# Patient Record
Sex: Female | Born: 1940 | Race: White | Hispanic: No | Smoking: Never smoker
Health system: Southern US, Community
[De-identification: ages and names within clinical notes are randomized; demographics above are authoritative.]

## PROBLEM LIST (undated history)

## (undated) DIAGNOSIS — M1990 Unspecified osteoarthritis, unspecified site: Secondary | ICD-10-CM

## (undated) DIAGNOSIS — K219 Gastro-esophageal reflux disease without esophagitis: Secondary | ICD-10-CM

## (undated) DIAGNOSIS — F419 Anxiety disorder, unspecified: Secondary | ICD-10-CM

## (undated) DIAGNOSIS — E785 Hyperlipidemia, unspecified: Secondary | ICD-10-CM

## (undated) DIAGNOSIS — T8859XA Other complications of anesthesia, initial encounter: Secondary | ICD-10-CM

## (undated) DIAGNOSIS — R002 Palpitations: Secondary | ICD-10-CM

## (undated) DIAGNOSIS — I10 Essential (primary) hypertension: Secondary | ICD-10-CM

## (undated) DIAGNOSIS — G250 Essential tremor: Secondary | ICD-10-CM

## (undated) DIAGNOSIS — E119 Type 2 diabetes mellitus without complications: Secondary | ICD-10-CM

## (undated) DIAGNOSIS — F32A Depression, unspecified: Secondary | ICD-10-CM

## (undated) DIAGNOSIS — M797 Fibromyalgia: Secondary | ICD-10-CM

## (undated) DIAGNOSIS — F329 Major depressive disorder, single episode, unspecified: Secondary | ICD-10-CM

## (undated) HISTORY — DX: Palpitations: R00.2

## (undated) HISTORY — DX: Depression, unspecified: F32.A

## (undated) HISTORY — DX: Major depressive disorder, single episode, unspecified: F32.9

## (undated) HISTORY — DX: Anxiety disorder, unspecified: F41.9

## (undated) HISTORY — DX: Essential tremor: G25.0

## (undated) HISTORY — DX: Hyperlipidemia, unspecified: E78.5

## (undated) HISTORY — PX: EYE SURGERY: SHX253

---

## 2000-10-21 ENCOUNTER — Inpatient Hospital Stay (HOSPITAL_COMMUNITY): Admission: EM | Admit: 2000-10-21 | Discharge: 2000-10-23 | Payer: Self-pay | Admitting: Emergency Medicine

## 2000-10-21 ENCOUNTER — Encounter: Payer: Self-pay | Admitting: Emergency Medicine

## 2000-10-23 ENCOUNTER — Encounter: Payer: Self-pay | Admitting: Family Medicine

## 2000-10-26 ENCOUNTER — Encounter: Payer: Self-pay | Admitting: Family Medicine

## 2000-10-26 ENCOUNTER — Ambulatory Visit (HOSPITAL_COMMUNITY): Admission: RE | Admit: 2000-10-26 | Discharge: 2000-10-26 | Payer: Self-pay | Admitting: Family Medicine

## 2003-06-14 ENCOUNTER — Ambulatory Visit (HOSPITAL_COMMUNITY): Admission: RE | Admit: 2003-06-14 | Discharge: 2003-06-14 | Payer: Self-pay | Admitting: Family Medicine

## 2003-06-26 ENCOUNTER — Ambulatory Visit (HOSPITAL_COMMUNITY): Admission: RE | Admit: 2003-06-26 | Discharge: 2003-06-26 | Payer: Self-pay | Admitting: Family Medicine

## 2003-11-30 ENCOUNTER — Emergency Department (HOSPITAL_COMMUNITY): Admission: EM | Admit: 2003-11-30 | Discharge: 2003-11-30 | Payer: Self-pay | Admitting: *Deleted

## 2007-01-30 ENCOUNTER — Ambulatory Visit (HOSPITAL_COMMUNITY): Admission: RE | Admit: 2007-01-30 | Discharge: 2007-01-30 | Payer: Self-pay | Admitting: Family Medicine

## 2007-10-04 ENCOUNTER — Ambulatory Visit (HOSPITAL_COMMUNITY): Admission: RE | Admit: 2007-10-04 | Discharge: 2007-10-04 | Payer: Self-pay | Admitting: Family Medicine

## 2008-07-12 ENCOUNTER — Emergency Department (HOSPITAL_COMMUNITY): Admission: EM | Admit: 2008-07-12 | Discharge: 2008-07-12 | Payer: Self-pay | Admitting: Emergency Medicine

## 2009-04-27 ENCOUNTER — Encounter: Payer: Self-pay | Admitting: Cardiology

## 2009-04-28 ENCOUNTER — Encounter (INDEPENDENT_AMBULATORY_CARE_PROVIDER_SITE_OTHER): Payer: Self-pay | Admitting: *Deleted

## 2009-04-28 LAB — CONVERTED CEMR LAB
CO2: 22 meq/L
Calcium: 9.4 mg/dL
Glucose, Bld: 123 mg/dL
Hemoglobin: 14 g/dL
MCV: 88.4 fL
Platelets: 381 10*3/uL
Potassium: 3.7 meq/L
Sodium: 137 meq/L
Sodium: 137 meq/L

## 2009-04-29 ENCOUNTER — Encounter: Payer: Self-pay | Admitting: Cardiology

## 2009-04-30 ENCOUNTER — Encounter: Payer: Self-pay | Admitting: Cardiology

## 2009-04-30 ENCOUNTER — Ambulatory Visit (HOSPITAL_COMMUNITY): Admission: RE | Admit: 2009-04-30 | Discharge: 2009-04-30 | Payer: Self-pay | Admitting: Family Medicine

## 2009-05-07 ENCOUNTER — Encounter (INDEPENDENT_AMBULATORY_CARE_PROVIDER_SITE_OTHER): Payer: Self-pay | Admitting: *Deleted

## 2009-05-07 DIAGNOSIS — F410 Panic disorder [episodic paroxysmal anxiety] without agoraphobia: Secondary | ICD-10-CM

## 2009-05-07 DIAGNOSIS — F341 Dysthymic disorder: Secondary | ICD-10-CM | POA: Insufficient documentation

## 2009-05-07 DIAGNOSIS — I10 Essential (primary) hypertension: Secondary | ICD-10-CM

## 2009-05-07 DIAGNOSIS — F329 Major depressive disorder, single episode, unspecified: Secondary | ICD-10-CM

## 2009-05-07 DIAGNOSIS — E785 Hyperlipidemia, unspecified: Secondary | ICD-10-CM | POA: Insufficient documentation

## 2009-05-19 ENCOUNTER — Ambulatory Visit: Payer: Self-pay | Admitting: Cardiology

## 2009-05-19 DIAGNOSIS — K219 Gastro-esophageal reflux disease without esophagitis: Secondary | ICD-10-CM | POA: Insufficient documentation

## 2009-05-19 DIAGNOSIS — R55 Syncope and collapse: Secondary | ICD-10-CM | POA: Insufficient documentation

## 2009-05-19 DIAGNOSIS — R739 Hyperglycemia, unspecified: Secondary | ICD-10-CM | POA: Insufficient documentation

## 2009-05-19 DIAGNOSIS — IMO0001 Reserved for inherently not codable concepts without codable children: Secondary | ICD-10-CM

## 2009-05-20 ENCOUNTER — Encounter: Payer: Self-pay | Admitting: Cardiology

## 2009-05-25 ENCOUNTER — Encounter (INDEPENDENT_AMBULATORY_CARE_PROVIDER_SITE_OTHER): Payer: Self-pay | Admitting: *Deleted

## 2009-08-20 ENCOUNTER — Encounter (INDEPENDENT_AMBULATORY_CARE_PROVIDER_SITE_OTHER): Payer: Self-pay | Admitting: *Deleted

## 2009-08-21 ENCOUNTER — Ambulatory Visit: Payer: Self-pay | Admitting: Cardiology

## 2009-08-24 LAB — CONVERTED CEMR LAB: Cortisol - AM: 17.3 ug/dL (ref 4.3–22.4)

## 2009-09-02 ENCOUNTER — Encounter (INDEPENDENT_AMBULATORY_CARE_PROVIDER_SITE_OTHER): Payer: Self-pay | Admitting: *Deleted

## 2009-10-28 ENCOUNTER — Encounter: Payer: Self-pay | Admitting: Cardiology

## 2010-03-12 ENCOUNTER — Encounter (INDEPENDENT_AMBULATORY_CARE_PROVIDER_SITE_OTHER): Payer: Self-pay | Admitting: *Deleted

## 2010-03-30 ENCOUNTER — Ambulatory Visit: Payer: Self-pay | Admitting: Cardiology

## 2010-04-25 ENCOUNTER — Encounter: Payer: Self-pay | Admitting: Family Medicine

## 2010-05-04 NOTE — Assessment & Plan Note (Signed)
Summary: ***NP3 SYNCOPE   Visit Type:  Initial Consult Primary Provider:  Dr. Lilyan Punt   History of Present Illness: [name] is a very pleasant 70 year old woman seen at the kind request of Dr. Lilyan Punt for evaluation of long-standing episodic syncope.  Patient dates the onset of her symptoms to her teenage years.  Frequently when she arises, experiences strong emotion, experiences pain or experiences GI distress, she subsequently loses consciousness briefly.  Episodes have been too numerous to count, but injury has been rare.  She once fell in the bathroom and suffered a laceration that required sutures.  She describes 2 recent episodes after arising in the morning.  She noted dizziness and then found herself on the floor.   Significant premonitory symptoms accompany most episodes.  Current Medications (verified): 1)  Lorazepam 1 Mg Tabs (Lorazepam) .... 1/2 Tab Three Times A Day Then 1 Whole Tab At Bedtime 2)  Hydrochlorothiazide 25 Mg Tabs (Hydrochlorothiazide) .... Take 1 Tab Daily 3)  Protonix 40 Mg Solr (Pantoprazole Sodium) .... Take 1 Tab Two Times A Day 4)  Paxil 30 Mg Tabs (Paroxetine Hcl) .... Take 1 Tab Daily 5)  Lipitor 80 Mg Tabs (Atorvastatin Calcium) .... Take 1 Tab Daily 6)  Xalatan 0.005 % Soln (Latanoprost) .[name].. 1 Drop Each Eye At Bedtime  Allergies (verified): 1)  ! Codeine  Past History:  Family History: Last updated: 05-24-09 Father deceased secondary to myocardial infarction Mother-alive, but has history of both adenocarcinoma of the colon and ovarian carcinoma Siblings-2; one with history of deep vein thrombosis.  Positive for rheumatoid arthritis and thyroid disease  Social History: Last updated: 05/24/2009 Employment-has not worked outside of the home Tobacco Use -Never Alcohol Use - no Regular Exercise - no Drug Use - no  PMH, FH, and Social History reviewed and updated.  Past Medical History: Syncope HYPERTENSION  (ICD-401.9) HYPERLIPIDEMIA (ICD-272.4) DEPRESSION (ICD-311) ANXIETY (ICD-300.00)  Past Surgical History: Bilateral excisional breast biopsies for benign disease-1960s  Family History: Father deceased secondary to myocardial infarction Mother-alive, but has history of both adenocarcinoma of the colon and ovarian carcinoma Siblings-2; one with history of deep vein thrombosis.  Positive for rheumatoid arthritis and thyroid disease  Social History: Employment-has not worked outside of the home Tobacco Use -Never Alcohol Use - no Regular Exercise - no Drug Use - no  Review of Systems       Occasional episodes of dizziness; requires corrective lenses for near vision; few mild palpitations; poor appetite without significant weight loss; gastroesophageal reflux disease symptoms; chronic back pain.  All other systems reviewed and are negative.  Vital Signs:  Patient profile:   70 year old female Height:      60 inches Weight:      163 pounds BMI:     31.95 Pulse rate:   103 / minute Pulse (ortho):   109 / minute BP sitting:   137 / 93  (right arm) BP standing:   111 / 80  Vitals Entered By: Dreama Saa, CNA (May 24, 2009 1:05 PM)  Serial Vital Signs/Assessments:  Time      Position  BP       Pulse  Resp  Temp     By 1:53 PM   Lying LA  126/90   102                   Lynn Via LPN 8:41 PM   Sitting   130/84   114  Larita Fife Via LPN 1:61 PM   Standing  111/80   109                   Lynn Via LPN  Comments: 0:96 PM Patient c/o slight dizziness when standing from sitting position. By: Larita Fife Via LPN    Physical Exam  General:   Well-developed; no acute distress; overweight HEENT-[place]/AT; PERRL; EOM intact; conjunctiva and lids nl:  Neck-No JVD; no carotid bruits: Endocrine-No thyromegaly: Lungs-No tachypnea, clear without rales, rhonchi or wheezes: CV-normal PMI; normal S1 and S2 Abdomen-BS normal; soft and non-tender without masses or  organomegaly: MS-No deformities, cyanosis or clubbing: Neurologic-Nl cranial nerves; symmetric strength and tone: Skin- Warm, multiple skin tags Extremities-Nl distal pulses; no edema    Impression & Recommendations:  Problem # 1:  SYNCOPE (ICD-780.2) The long-standing and recurring nature of the patient's symptoms essentially exclude conditions with a significant risk of mortality.  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 There is a borderline significant decrease in systolic blood pressure of 15-20 mmHg when the upright posture is assumed.  There is also a resting tachycardia exacerbated by standing.  She made need criteria for Postural Orthostatic Tachycardia Syndrome.   Initial therapy will involve liberalizing the salt in her diet, maintaining fluid intake, avoiding dehydration and behavioral measures, such as isometric exercise, to prevent loss of consciousness when threatened.  A serum cortisol level will be obtained.  Recent thyroid-stimulating hormone level was normal.  Problem # 2:  HYPERLIPIDEMIA (ICD-272.4)  No recent lipid profile results are available to me.  I would be happy to assist with management of this problem if Dr. Gerda Diss so desires.  Otherwise, [name]  Other Orders: Future Orders: T- * Misc. Laboratory test [phone]) ... 05/20/2009   Patient Instructions: 1)  Your physician recommends that you schedule a follow-up appointment in: 3 months 2)  Your physician recommends that you return for lab work in: tomorrow 3)  Your physician has requested that you limit the intake of sodium (salt) in your diet to four grams daily. Please see [facility] handout.

## 2010-05-04 NOTE — Letter (Signed)
Summary: Appointment - Reminder 2  [facility] [address]   [place], [place] [address]   Phone: [phone]  Fax: [phone]     March 12, 2010 MRN: [id number]   Jacqueline Morgan [address] [place], [place]  [address]   Dear Ms. SOTTILE,  Our records indicate that it is time to schedule a follow-up appointment.  Dr.   Dietrich Pates       recommended that you follow up with [place] in   12.2011         . It is very important that we reach you to schedule this appointment. We look forward to participating in your health care needs. Please contact us at the number listed above at your earliest convenience to schedule your appointment.  If you are unable to make an appointment at this time, give [place] a call so we can update our records.     Sincerely,   Glass blower/designer

## 2010-05-04 NOTE — Assessment & Plan Note (Signed)
Summary: F3M   Visit Type:  Follow-up Primary Provider:  Dr. Lilyan Punt  CC:  .[name]  History of Present Illness: Jacqueline Morgan returns to the office as scheduled for continued assessment and treatment of long-standing and recurrent syncope.  Over the past few months, symptoms have been improved.  She notes occasional lightheadedness that resolves with assuming a seated position.  She has had 2 episodes of loss of consciousness, both occurring in proximity to a GI illness characterized by abdominal pain and profuse diarrhea.  On one occasion, she lay on the floor when she felt impending loss of consciousness, but this did not prevent a spell.  On neither occasion did she fall or suffer any injuries.  Current Medications (verified): 1)  Lorazepam 1 Mg Tabs (Lorazepam) .... 1/2 Tab Three Times A Day Then 1 Whole Tab At Bedtime 2)  Protonix 40 Mg Solr (Pantoprazole Sodium) .... Take 1 Tab Daily 3)  Paxil 30 Mg Tabs (Paroxetine Hcl) .... Take 1 Tab Daily 4)  Lipitor 40 Mg Tabs (Atorvastatin Calcium) .... Take 1 Tab Daily 5)  Xalatan 0.005 % Soln (Latanoprost) .[name].. 1 Drop Each Eye At Bedtime 6)  Acidophilus  Caps (Lactobacillus) .... Take 1 Tab Daily 7)  Metoprolol Succinate 50 Mg Xr24h-Tab (Metoprolol Succinate) .... Take 1 Tablet By Mouth Once A Day  Allergies (verified): 1)  ! Codeine  Past History:  PMH, FH, and Social History reviewed and updated.  Review of Systems  The patient denies weight loss, weight gain, chest pain, dyspnea on exertion, and peripheral edema.    Vital Signs:  Patient profile:   70 year old female Weight:      166 pounds Pulse rate:   99 / minute Pulse (ortho):   102 / minute BP sitting:   140 / 87  (right arm) BP standing:   115 / 75  Vitals Entered By: Dreama Saa, CNA (Aug 21, 2009 12:55 PM)  Serial Vital Signs/Assessments:  Time      Position  BP       Pulse  Resp  Temp     By 1:00pm    Lying LA  114/80   102                   Tammy Sanders  RN 1:00pm    Sitting   121/81   98                    Tammy Sanders RN 1:00pm    Standing  115/75   102                   Tammy Sanders RN   Physical Exam  General:   Well-developed; no acute distress; overweight; facial tics and tremor Blood pressure 100/60 without orthostatic change HEENT-[place]/AT; PERRL; EOM intact; conjunctiva and lids nl:  Neck-No JVD; no carotid bruits: Endocrine-No thyromegaly: Lungs-No tachypnea, clear without rales, rhonchi or wheezes: CV-normal PMI; normal S1 and S2 Abdomen-BS normal; soft and non-tender without masses or organomegaly: MS-No deformities, cyanosis or clubbing: Neurologic-Nl cranial nerves; symmetric strength and tone: Skin- Warm, multiple skin tags Extremities-Nl distal pulses; no edema    Impression & Recommendations:  Problem # 1:  SYNCOPE (ICD-780.2) Symptoms are improved with an increase in dietary sodium.  Absolutely no change is seen between lying and standing systolic blood pressure at today's visit, which is an improvement.  The diuretic that she is taking for hypertension could be an additional predisposition  to decreased blood pressure and loss of consciousness.  We will substitute metoprolol succinate 50 mg q.d., which may also have a beneficial effect in POTS Syndrome.  Patient will monitor blood pressure and call for any significant elevations.  She will also call for any unprovoked episodes of syncope.  Otherwise, will plan to see this nice woman again in 7 months.  We cannot locate a cortisol level, which she believes was drawn a few months ago.  That test will be repeated.  Other Orders: Future Orders: T- * Misc. Laboratory test [phone]) ... 08/24/2009  Patient Instructions: 1)  Your physician recommends that you schedule a follow-up appointment in: 7 months 2)  Your physician recommends that you return for lab work in: Monday 3)  Your physician has recommended you make the following change in your medication: stop  hydrocholathazide, start metoprolol succinate 50mg  daily 4)  Your physician has requested that you regularly monitor and record your blood pressure readings at home.  Please use the same machine at the same time of day to check your readings and record them to bring to your follow-up visit. 5)  call for systolic bp >140 and Diastolic bp >90 Prescriptions: METOPROLOL SUCCINATE 50 MG XR24H-TAB (METOPROLOL SUCCINATE) Take 1 tablet by mouth once a day  #30 x 3   Entered by:   Teressa Lower RN   Authorized by:   Kathlen Brunswick, MD, [facility]   Signed by:   Teressa Lower RN on 08/21/2009   Method used:   Electronically to        [facility] (retail)       [address]       [place], [place]  [address]       Ph: [phone]       Fax: [phone]   RxID:   [id number]

## 2010-05-04 NOTE — Miscellaneous (Signed)
Summary: LABS CBCD,BMP,T4,TSH,04/28/2009  Clinical Lists Changes  Observations: Added new observation of CALCIUM: 9.4 mg/dL (16/01/9603 5:40) Added new observation of CREATININE: 0.79 mg/dL (98/02/9146 8:29) Added new observation of BUN: 11 mg/dL (56/21/3086 5:78) Added new observation of BG RANDOM: 123 mg/dL (46/96/2952 8:41) Added new observation of CO2 PLSM/SER: 22 meq/L (04/28/2009 8:24) Added new observation of CL SERUM: 101 meq/L (04/28/2009 8:24) Added new observation of K SERUM: 3.7 meq/L (04/28/2009 8:24) Added new observation of NA: 137 meq/L (04/28/2009 8:24) Added new observation of PLATELETK/UL: 381 K/uL (04/28/2009 8:24) Added new observation of MCV: 88.4 fL (04/28/2009 8:24) Added new observation of HCT: 42.6 % (04/28/2009 8:24) Added new observation of HGB: 14.0 g/dL (32/44/0102 7:25) Added new observation of WBC COUNT: 6.2 10*3/microliter (04/28/2009 8:24) Added new observation of TSH: 1.163 microintl units/mL (04/28/2009 8:24) Added new observation of T4, FREE: 8.9 ng/dL (36/64/4034 7:42)

## 2010-05-04 NOTE — Letter (Signed)
Summary: EXG  EXG   Imported By: Faythe Ghee 05/20/2009 15:25:43  _____________________________________________________________________  External Attachment:    Type:   Image     Comment:   External Document

## 2010-05-04 NOTE — Letter (Signed)
Summary: bp readings  bp readings   Imported By: Faythe Ghee 10/28/2009 09:27:53  _____________________________________________________________________  External Attachment:    Type:   Image     Comment:   External Document

## 2010-05-04 NOTE — Miscellaneous (Signed)
Summary: LABS BMP,T4,TSH,04/28/2009  Clinical Lists Changes  Observations: Added new observation of CALCIUM: 9.4 mg/dL (16/01/9603 54:09) Added new observation of CREATININE: 0.79 mg/dL (81/19/1478 29:56) Added new observation of BUN: 11 mg/dL (21/30/8657 84:69) Added new observation of BG RANDOM: 123 mg/dL (62/95/2841 32:44) Added new observation of CO2 PLSM/SER: 22 meq/L (04/28/2009 10:15) Added new observation of CL SERUM: 101 meq/L (04/28/2009 10:15) Added new observation of K SERUM: 3.7 meq/L (04/28/2009 10:15) Added new observation of NA: 137 meq/L (04/28/2009 10:15) Added new observation of TSH: 1.163 microintl units/mL (04/28/2009 10:15) Added new observation of T4, FREE: 8.9 ng/dL (04/06/7251 66:44)

## 2010-05-04 NOTE — Letter (Signed)
Summary: LABS  LABS   Imported By: Faythe Ghee 05/20/2009 15:09:15  _____________________________________________________________________  External Attachment:    Type:   Image     Comment:   External Document

## 2010-05-04 NOTE — Letter (Signed)
Summary: MED LIST  MED LIST   Imported By: Faythe Ghee 05/20/2009 15:23:43  _____________________________________________________________________  External Attachment:    Type:   Image     Comment:   External Document

## 2010-05-04 NOTE — Letter (Signed)
Summary: [place] [facility] at [facility]  [address] [place] [address]   Phone: [phone]  Fax: [phone]      September 02, 2009 MRN: [id number]   Jacqueline Morgan [address] [place], [place]  [address]   Dear [name],  Your test ordered by Selena Batten has been reviewed by your physician (or physician assistant) and was found to be normal or stable. Your physician (or physician assistant) felt no changes were needed at this time.  ____ Echocardiogram  ____ Cardiac Stress Test  __x__ Lab Work  ____ Peripheral vascular study of arms, legs or neck  ____ CT scan or X-ray  ____ Lung or Breathing test  ____ Other:  No change in medical treatment at this time, per Dr. Dietrich Pates.  Enclosed is a copy of your labwork for your records.  Thank you, Kristjan Derner Allyne Gee RN    Savannah Bing, MD, Lenise Arena.C.Gaylord Shih, MD, F.A.C.C Lewayne Bunting, MD, F.A.C.C Nona Dell, MD, F.A.C.C Charlton Haws, MD, Lenise Arena.C.C

## 2010-05-04 NOTE — Letter (Signed)
Summary: [place] [employer] [facility], [facility] at [facility]  [address] [place] [address]   Phone: [phone]  Fax: [phone]      May 25, 2009 MRN: [id number]   NARALY FRITCHER [address] [place], [place]  [address]   Dear [name],  Your test ordered by Selena Batten has been reviewed by your physician (or physician assistant) and was found to be normal or stable. Your physician (or physician assistant) felt no changes were needed at this time.  ____ Echocardiogram  ____ Cardiac Stress Test  __x__ Lab Work  ____ Peripheral vascular study of arms, legs or neck  ____ CT scan or X-ray  ____ Lung or Breathing test  ____ Other:  No change in medical treatment at this time, per Dr. Dietrich Pates.  Enclosed is a copy of your labwork for your records.  Thank you, Linnie Mcglocklin Allyne Gee RN    Kingsland Bing, MD, Lenise Arena.C.Gaylord Shih, MD, F.A.C.C Lewayne Bunting, MD, F.A.C.C Nona Dell, MD, F.A.C.C Charlton Haws, MD, Lenise Arena.C.C

## 2010-05-04 NOTE — Letter (Signed)
Summary: PROGRESS NOTES  PROGRESS NOTES   Imported By: Faythe Ghee 05/20/2009 15:25:02  _____________________________________________________________________  External Attachment:    Type:   Image     Comment:   External Document

## 2010-05-06 NOTE — Assessment & Plan Note (Signed)
Summary: rov   Visit Type:  Follow-up Primary Provider:  Dr. Lilyan Punt  CC:  no cardiology complaints.  History of Present Illness: Jacqueline Morgan is a pleasant 70 y/o CF we are following for continued assessment and treatment of syncope and palpatations.  She has had one episode of syncope approx 3 weeks ago associated with diarrhea and nausea. She states this is common for her to have with any GI illness. Review of records confirms this per prior visits.  She is followed by Dr. Gerda Diss and he checks labs concerning her cholesterol status. She is due for labs this week.  She has tremors that have been chronic for her. She says that she has chronic anxiety and painic attacks.  No complaints of chest pain or SOB.  Labs dated 11/24/2009: TC 253;TG 257; HDL 39; LDL 163.  She is on Liptor 40mg  daily. ++` Current Medications (verified): 1)  Lorazepam 1 Mg Tabs (Lorazepam) .... 1/2 Tab Three Times A Day Then 1 Whole Tab At Bedtime 2)  Protonix 40 Mg Solr (Pantoprazole Sodium) .... Take 1 Tab Daily 3)  Paxil 30 Mg Tabs (Paroxetine Hcl) .... Take 1 Tab Daily 4)  Lipitor 80 Mg Tabs (Atorvastatin Calcium) .... Take 1 Tab Daily 5)  Xalatan 0.005 % Soln (Latanoprost) .[name].. 1 Drop Each Eye At Bedtime 6)  Metoprolol Succinate 50 Mg Xr24h-Tab (Metoprolol Succinate) .... Take 1 Tablet By Mouth Once A Day  Allergies (verified): 1)  ! Codeine  Comments:  Nurse/Medical Assistant: patient brought meds reviewed with patient her pharmacy is [facility]  Review of Systems       All other systems have been reviewed and are negative unless stated above.   Vital Signs:  Patient profile:   70 year old female Weight:      170 pounds BMI:     33.32 O2 Sat:      98 % on Room air Pulse rate:   75 / minute BP sitting:   134 / 83  (right arm)  Vitals Entered By: Dreama Saa, CNA (March 30, 2010 1:35 PM)  O2 Flow:  Room air  Physical Exam  General:  Well developed, well nourished, in no acute  distress. Lungs:  Clear bilaterally to auscultation and percussion. Heart:  Non-displaced PMI, chest non-tender; regular rate and rhythm, S1, S2 without murmurs, rubs or gallops. Carotid upstroke normal, no bruit. Normal abdominal aortic size, no bruits. Femorals normal pulses, no bruits. Pedals normal pulses. No edema, no varicosities. Abdomen:  Bowel sounds positive; abdomen soft and non-tender without masses, organomegaly, or hernias noted. No hepatosplenomegaly. Msk:  Back normal, normal gait. Muscle strength and tone normal. Pulses:  pulses normal in all 4 extremities Extremities:  No clubbing or cyanosis. Neurologic:  Constant tremors   Impression & Recommendations:  Problem # 1:  HYPERLIPIDEMIA (ICD-272.4) She is due for labs this week. Will need careful monitoring of this and make medicine adjustments as necessary. May need to change to Crestor if she is not responding well to current Lipitor dose. Her updated medication list for this problem includes:    Lipitor 80 Mg Tabs (Atorvastatin calcium) .[name]... Take 1 tab daily  Problem # 2:  SYNCOPE (ICD-780.2) Syncope occurs with diarreha and nausea.  She states that this has happened for years.  She is not having this often. Use of metoprolol has helped her significantly.  Continue this as directed. She will see Dr. Dietrich Pates in 6 months unless she becomes symptomatic. Her updated medication list  for this problem includes:    Metoprolol Succinate 50 Mg Xr24h-tab (Metoprolol succinate) .[name]... Take 1 tablet by mouth once a day  Problem # 3:  ANXIETY (ICD-300.00) Tremors are baseline for her.  I have asked if she has been seen by a neurologist. She says it is related to her anxiety, but has never been seen by specialist.  Would recommend this at  the discretion of Dr. Gerda Diss.  Patient Instructions: 1)  Your physician recommends that you schedule a follow-up appointment in: 1 YEAR  Appended Document: rov  Reviewed Juanito Doom, MD

## 2010-07-10 ENCOUNTER — Emergency Department (HOSPITAL_COMMUNITY)
Admission: EM | Admit: 2010-07-10 | Discharge: 2010-07-10 | Disposition: A | Discharge status: Home / Self Care | Payer: Medicare Other | Attending: Emergency Medicine | Admitting: Emergency Medicine

## 2010-07-10 DIAGNOSIS — N390 Urinary tract infection, site not specified: Secondary | ICD-10-CM | POA: Insufficient documentation

## 2010-07-10 LAB — URINALYSIS, ROUTINE W REFLEX MICROSCOPIC
Bilirubin Urine: NEGATIVE
Glucose, UA: 100 mg/dL — AB
Ketones, ur: 15 mg/dL — AB
Nitrite: POSITIVE — AB
Specific Gravity, Urine: 1.025 (ref 1.005–1.030)
pH: 6.5 (ref 5.0–8.0)

## 2010-07-10 LAB — URINE MICROSCOPIC-ADD ON

## 2010-07-14 LAB — URINALYSIS, ROUTINE W REFLEX MICROSCOPIC
Bilirubin Urine: NEGATIVE
Glucose, UA: NEGATIVE mg/dL
Ketones, ur: NEGATIVE mg/dL
Protein, ur: 30 mg/dL — AB
Urobilinogen, UA: 0.2 mg/dL (ref 0.0–1.0)
pH: 7 (ref 5.0–8.0)

## 2010-07-14 LAB — URINE CULTURE

## 2010-07-14 LAB — URINE MICROSCOPIC-ADD ON

## 2011-03-16 ENCOUNTER — Ambulatory Visit (INDEPENDENT_AMBULATORY_CARE_PROVIDER_SITE_OTHER): Payer: Medicare Other | Admitting: Adult Health

## 2011-03-16 ENCOUNTER — Encounter: Payer: Self-pay | Admitting: Adult Health

## 2011-03-16 DIAGNOSIS — I10 Essential (primary) hypertension: Secondary | ICD-10-CM

## 2011-03-16 DIAGNOSIS — R002 Palpitations: Secondary | ICD-10-CM

## 2011-03-16 NOTE — Patient Instructions (Signed)
Your physician recommends that you schedule a follow-up appointment in: 12 months  Start Magnesium 400 mg twice a day

## 2011-03-16 NOTE — Progress Notes (Signed)
   HPI: Jacqueline Morgan is an anxious 70 y/o patient of Dr. Dietrich Pates we are following for ongoing assessment of hypertension and palpitations. She is followed by Dr.Luking for hypercholesterolemia and anxiety.  She comes today essentially asymptomatic with the exception of palpitations that she notices most after dinner at rest. She does not drink excessive caffeine. One [name] in the am only. She denies chest pain or syncope.  Allergies  Allergen Reactions  . Codeine     REACTION: Adverse gastrointestinal effects    Current Outpatient Prescriptions  Medication Sig Dispense Refill  . atorvastatin (LIPITOR) 80 MG tablet Take 80 mg by mouth daily.        . calcium carbonate (OS-CAL) 600 MG TABS Take 600 mg by mouth daily.        [name] LORazepam (ATIVAN) 1 MG tablet Take 1 mg by mouth as needed.        . magnesium oxide (MAG-OX) 400 MG tablet Take 400 mg by mouth 2 (two) times daily.        . metoprolol (TOPROL-XL) 50 MG 24 hr tablet Take 50 mg by mouth daily.        . pantoprazole (PROTONIX) 20 MG tablet Take 20 mg by mouth daily.        [name] PARoxetine (PAXIL) 30 MG tablet Take 30 mg by mouth every morning.          Past Medical History  Diagnosis Date  . Hyperlipidemia   . Palpitations   . Anxiety    ZOX:WRUEAV of systems complete and found to be negative unless listed above  PHYSICAL EXAM BP 146/89  Pulse 85  Ht 5' (1.524 m)  Wt 167 lb (75.751 kg)  BMI 32.62 kg/m2  General: Well developed, well nourished, in no acute distress Head: Eyes PERRLA, No xanthomas.   Normal cephalic and atramatic  Lungs: Clear bilaterally to auscultation and percussion. Heart: HRRR S1 S2, mildly tachycardic without MRG.  Pulses are 2+ & equal.            No carotid bruit. No JVD.  No abdominal bruits. No femoral bruits. Abdomen: Bowel sounds are positive, abdomen soft and non-tender without masses or                  Hernia's noted. Msk:  Back normal, normal gait. Normal strength and tone for  age. Extremities: No clubbing, cyanosis or edema.  DP +1 Neuro: Alert and oriented X 3. Psych:  Good affect, responds appropriately  EKG:NSR rate of 78 bpm.  ASSESSMENT AND PLAN

## 2011-03-16 NOTE — Assessment & Plan Note (Signed)
Blood pressure is mildly elevated today, but she states she is nervous today. Repeat of BP shows 138/88.  Will not make any changes in the medication regimen at this time. Palpitations are not symptomatic. I have recommended magnesium oxide 400 mg BID to assist with this and to eliminate caffeine altogether.

## 2011-04-19 ENCOUNTER — Encounter (HOSPITAL_COMMUNITY): Payer: Self-pay | Admitting: *Deleted

## 2011-04-19 ENCOUNTER — Emergency Department (HOSPITAL_COMMUNITY)
Admission: EM | Admit: 2011-04-19 | Discharge: 2011-04-19 | Disposition: A | Discharge status: Home / Self Care | Payer: Medicare Other | Attending: Emergency Medicine | Admitting: Emergency Medicine

## 2011-04-19 ENCOUNTER — Emergency Department (HOSPITAL_COMMUNITY): Payer: Medicare Other

## 2011-04-19 DIAGNOSIS — IMO0001 Reserved for inherently not codable concepts without codable children: Secondary | ICD-10-CM | POA: Diagnosis not present

## 2011-04-19 DIAGNOSIS — E785 Hyperlipidemia, unspecified: Secondary | ICD-10-CM | POA: Insufficient documentation

## 2011-04-19 DIAGNOSIS — F411 Generalized anxiety disorder: Secondary | ICD-10-CM | POA: Diagnosis not present

## 2011-04-19 DIAGNOSIS — R51 Headache: Secondary | ICD-10-CM | POA: Insufficient documentation

## 2011-04-19 HISTORY — DX: Fibromyalgia: M79.7

## 2011-04-19 MED ORDER — LORAZEPAM 2 MG/ML IJ SOLN
0.5000 mg | Freq: Once | INTRAMUSCULAR | Status: AC
  Administered 2011-04-19: 0.5 mg via INTRAVENOUS
  Filled 2011-04-19: qty 1

## 2011-04-19 MED ORDER — KETOROLAC TROMETHAMINE 30 MG/ML IJ SOLN
30.0000 mg | Freq: Once | INTRAMUSCULAR | Status: AC
  Administered 2011-04-19: 30 mg via INTRAVENOUS
  Filled 2011-04-19: qty 1

## 2011-04-19 MED ORDER — METOCLOPRAMIDE HCL 5 MG/ML IJ SOLN
10.0000 mg | Freq: Once | INTRAMUSCULAR | Status: AC
  Administered 2011-04-19: 10 mg via INTRAVENOUS
  Filled 2011-04-19: qty 2

## 2011-04-19 MED ORDER — OXYCODONE-ACETAMINOPHEN 5-325 MG PO TABS: 1.0000 | ORAL_TABLET | Freq: Four times a day (QID) | ORAL | Status: AC | PRN

## 2011-04-19 NOTE — ED Provider Notes (Signed)
History     CSN: [id number]  Arrival date & time 04/19/11  1654   First MD Initiated Contact with Patient 04/19/11 1715      Chief Complaint  Patient presents with  . Headache    (Consider location/radiation/quality/duration/timing/severity/associated sxs/prior treatment) Patient is a 71 y.o. female presenting with headaches. The history is provided by the patient (patient complains of a stabbing left temporal headache severe in nature no nausea this started today patient states she's had headache somewhat similar to this but not quite as severe). No language interpreter was used.  Headache  This is a new problem. The current episode started 3 to 5 hours ago. The problem occurs constantly. The problem has not changed since onset.The headache is associated with bright light. The pain is located in the temporal region. The quality of the pain is described as sharp. The pain is at a severity of 7/10. The pain does not radiate. Pertinent negatives include no anorexia and no malaise/fatigue.    Past Medical History  Diagnosis Date  . Hyperlipidemia   . Palpitations   . Anxiety   . Fibromyalgia     History reviewed. No pertinent past surgical history.  History reviewed. No pertinent family history.  History  Substance Use Topics  . Smoking status: Never Smoker   . Smokeless tobacco: Not on file  . Alcohol Use: No    OB History    Grav Para Term Preterm Abortions TAB SAB Ect Mult Living                  Review of Systems  Constitutional: Negative for malaise/fatigue and fatigue.  HENT: Negative for congestion, sinus pressure and ear discharge.   Eyes: Negative for discharge.  Respiratory: Negative for cough.   Cardiovascular: Negative for chest pain.  Gastrointestinal: Negative for abdominal pain, diarrhea and anorexia.  Genitourinary: Negative for frequency and hematuria.  Musculoskeletal: Negative for back pain.  Skin: Negative for rash.  Neurological: Positive for  headaches. Negative for seizures.  Hematological: Negative.   Psychiatric/Behavioral: Negative for hallucinations.    Allergies  Codeine  Home Medications   Current Outpatient Rx  Name Route Sig Dispense Refill  . ACETAMINOPHEN 500 MG PO TABS Oral Take 1,000 mg by mouth once as needed. For headache/pain    . ATORVASTATIN CALCIUM 80 MG PO TABS Oral Take 80 mg by mouth daily.      [name] CALCIUM CARBONATE 600 MG PO TABS Oral Take 600 mg by mouth daily.      [name] LATANOPROST 0.005 % OP SOLN Both Eyes Place 1 drop into both eyes at bedtime.    [name] LORAZEPAM 1 MG PO TABS Oral Take 1 mg by mouth as needed. For sleep    . MAGNESIUM OXIDE 400 MG PO TABS Oral Take 400 mg by mouth 2 (two) times daily.      [name] METOPROLOL SUCCINATE ER 50 MG PO TB24 Oral Take 50 mg by mouth daily.      [name] PANTOPRAZOLE SODIUM 40 MG PO TBEC Oral Take 40 mg by mouth daily.    [name] PAROXETINE HCL 30 MG PO TABS Oral Take 30 mg by mouth at bedtime.     . OXYCODONE-ACETAMINOPHEN 5-325 MG PO TABS Oral Take 1 tablet by mouth every 6 (six) hours as needed for pain. 10 tablet 0    BP 146/56  Pulse 69  Temp(Src) 97.6 F (36.4 C) (Oral)  Resp 18  Ht 5' (1.524 m)  Wt 165 lb (74.844  kg)  BMI 32.22 kg/m2  SpO2 99%  Physical Exam  Constitutional: She is oriented to person, place, and time. She appears well-developed.  HENT:  Head: Normocephalic and atraumatic.  Eyes: Conjunctivae and EOM are normal. No scleral icterus.  Neck: Neck supple. No thyromegaly present.  Cardiovascular: Normal rate and regular rhythm.  Exam reveals no gallop and no friction rub.   No murmur heard. Pulmonary/Chest: No stridor. She has no wheezes. She has no rales. She exhibits no tenderness.  Abdominal: She exhibits no distension. There is no tenderness. There is no rebound.  Musculoskeletal: Normal range of motion. She exhibits no edema.  Lymphadenopathy:    She has no cervical adenopathy.  Neurological: She is alert and oriented to person, place, and  time. She has normal reflexes. No cranial nerve deficit. Coordination normal.  Skin: No rash noted. No erythema.  Psychiatric: She has a normal mood and affect. Her behavior is normal.    ED Course  Procedures (including critical care time)  Labs Reviewed - No data to display Ct Head Wo Contrast  04/19/2011  *RADIOLOGY REPORT*  Clinical Data: Left sided headache  CT HEAD WITHOUT CONTRAST  Technique:  Contiguous axial images were obtained from the base of the skull through the vertex without contrast.  Comparison: 04/30/2009  Findings: No acute hemorrhage, acute infarction, or mass lesion is identified.  No midline shift.  No ventriculomegaly.  No skull fracture.  No midline shift.  Orbits and paranasal sinuses are intact.  IMPRESSION: No acute intracranial finding.  Original Report Authenticated By: Harrel Lemon, M.D.     1. Headache    Pt improved with tx   MDM  Stress headache        Benny Lennert, MD 04/19/11 [phone]

## 2011-04-19 NOTE — ED Notes (Signed)
Sudden left sharp pain to head and pressure, took two tylenol at 1500, denies N/V

## 2011-04-19 NOTE — ED Notes (Signed)
Pt resting comfortably.  Denies pain.  EDP has reevaluated. Pt waiting for discharge.  Denies needs at present time.

## 2011-05-11 DIAGNOSIS — Z1231 Encounter for screening mammogram for malignant neoplasm of breast: Secondary | ICD-10-CM | POA: Diagnosis not present

## 2011-06-13 ENCOUNTER — Telehealth: Payer: Self-pay | Admitting: Adult Health

## 2011-06-13 ENCOUNTER — Other Ambulatory Visit: Payer: Self-pay | Admitting: *Deleted

## 2011-06-13 MED ORDER — METOPROLOL SUCCINATE ER 50 MG PO TB24: 50.0000 mg | ORAL_TABLET | Freq: Every day | ORAL | Status: DC

## 2011-06-13 NOTE — Telephone Encounter (Signed)
**Note De-Identified Jacqueline Morgan Obfuscation** Toprol refill sent to [facility] for 7 tablets, pt. Is aware./LV

## 2011-06-13 NOTE — Telephone Encounter (Signed)
Patient needs 1 wk supply of Toprol called to [facility] til she can get her mail order. / tg

## 2011-09-02 DIAGNOSIS — T148XXA Other injury of unspecified body region, initial encounter: Secondary | ICD-10-CM | POA: Diagnosis not present

## 2012-02-02 ENCOUNTER — Other Ambulatory Visit: Payer: Self-pay | Admitting: Adult Health

## 2012-02-02 NOTE — Telephone Encounter (Signed)
Please call Toprol in to [facility]. / tg

## 2012-02-03 MED ORDER — METOPROLOL SUCCINATE ER 50 MG PO TB24: 50.0000 mg | ORAL_TABLET | Freq: Every day | ORAL | Status: DC

## 2012-02-16 ENCOUNTER — Other Ambulatory Visit: Payer: Self-pay | Admitting: Cardiology

## 2012-02-16 MED ORDER — METOPROLOL SUCCINATE ER 50 MG PO TB24: 50.0000 mg | ORAL_TABLET | Freq: Every day | ORAL | Status: DC

## 2012-02-24 ENCOUNTER — Ambulatory Visit (INDEPENDENT_AMBULATORY_CARE_PROVIDER_SITE_OTHER): Payer: Medicare Other | Admitting: Adult Health

## 2012-02-24 ENCOUNTER — Encounter: Payer: Self-pay | Admitting: Adult Health

## 2012-02-24 VITALS — BP 152/74 | HR 67 | Ht 60.0 in | Wt 172.0 lb

## 2012-02-24 DIAGNOSIS — R002 Palpitations: Secondary | ICD-10-CM | POA: Diagnosis not present

## 2012-02-24 DIAGNOSIS — E782 Mixed hyperlipidemia: Secondary | ICD-10-CM

## 2012-02-24 DIAGNOSIS — R55 Syncope and collapse: Secondary | ICD-10-CM | POA: Diagnosis not present

## 2012-02-24 DIAGNOSIS — R6889 Other general symptoms and signs: Secondary | ICD-10-CM

## 2012-02-24 DIAGNOSIS — R739 Hyperglycemia, unspecified: Secondary | ICD-10-CM

## 2012-02-24 DIAGNOSIS — E785 Hyperlipidemia, unspecified: Secondary | ICD-10-CM

## 2012-02-24 DIAGNOSIS — R7309 Other abnormal glucose: Secondary | ICD-10-CM | POA: Diagnosis not present

## 2012-02-24 DIAGNOSIS — R899 Unspecified abnormal finding in specimens from other organs, systems and tissues: Secondary | ICD-10-CM

## 2012-02-24 DIAGNOSIS — I10 Essential (primary) hypertension: Secondary | ICD-10-CM

## 2012-02-24 MED ORDER — METOPROLOL SUCCINATE ER 50 MG PO TB24: 50.0000 mg | ORAL_TABLET | Freq: Every day | ORAL | Status: DC

## 2012-02-24 NOTE — Progress Notes (Deleted)
Name: Jacqueline Morgan    DOB: 08/12/1940  Age: 71 y.o.  MR#: [id number]       PCP:  Lilyan Punt, MD      Insurance: @PAYORNAME @   CC:    Chief Complaint  Patient presents with  . Follow-up    Patient has had some episode of the heart racing, and shortness of breath.    VS BP 152/74  Pulse 67  Ht 5' (1.524 m)  Wt 172 lb (78.019 kg)  BMI 33.59 kg/m2  SpO2 95%  Weights Current Weight  02/24/12 172 lb (78.019 kg)  04/19/11 165 lb (74.844 kg)  03/16/11 167 lb (75.751 kg)    Blood Pressure  BP Readings from Last 3 Encounters:  02/24/12 152/74  04/19/11 122/70  03/16/11 146/89     Admit date:  (Not on file) Last encounter with RMR:  02/02/2012   Allergy Allergies  Allergen Reactions  . Codeine     REACTION: Adverse gastrointestinal effects    Current Outpatient Prescriptions  Medication Sig Dispense Refill  . acetaminophen (TYLENOL) 500 MG tablet Take 1,000 mg by mouth once as needed. For headache/pain      . calcium carbonate (OS-CAL) 600 MG TABS Take 600 mg by mouth daily.        [name] latanoprost (XALATAN) 0.005 % ophthalmic solution Place 1 drop into both eyes at bedtime.      [name] LORazepam (ATIVAN) 1 MG tablet Take 1 mg by mouth as needed. For sleep      . magnesium oxide (MAG-OX) 400 MG tablet Take 400 mg by mouth 2 (two) times daily.        . metoprolol succinate (TOPROL-XL) 50 MG 24 hr tablet Take 1 tablet (50 mg total) by mouth daily.  90 tablet  0  . pantoprazole (PROTONIX) 40 MG tablet Take 40 mg by mouth daily.      [name] PARoxetine (PAXIL) 30 MG tablet Take 30 mg by mouth at bedtime.         Discontinued Meds:    Medications Discontinued During This Encounter  Medication Reason  . atorvastatin (LIPITOR) 80 MG tablet Error    Patient Active Problem List  Diagnosis  . HYPERLIPIDEMIA  . ANXIETY  . DEPRESSION  . HYPERTENSION  . GASTROESOPHAGEAL REFLUX DISEASE  . FIBROMYALGIA  . SYNCOPE  . HYPERGLYCEMIA, FASTING    LABS No visits with results within 3  Month(s) from this visit. Latest known visit with results is:  Admission on 07/10/2010, Discharged on 07/10/2010  Component Date Value  . Color, Urine 07/10/2010 RED BIOCHEMICALS MAY BE AFFECTED BY COLOR*  . APPearance 07/10/2010 HAZY*  . Specific Gravity, Urine 07/10/2010 1.025   . pH 07/10/2010 6.5   . Glucose, UA 07/10/2010 100*  . Hgb urine dipstick 07/10/2010 LARGE*  . Bilirubin Urine 07/10/2010 NEGATIVE   . Ketones, ur 07/10/2010 15*  . Protein, ur 07/10/2010 >300*  . Urobilinogen, UA 07/10/2010 4.0*  . Nitrite 07/10/2010 POSITIVE*  . Leukocytes, UA 07/10/2010 MODERATE*  . WBC, UA 07/10/2010 11-20   . RBC / HPF 07/10/2010 TOO NUMEROUS TO COUNT   . Bacteria, UA 07/10/2010 FEW*     Results for this Opt Visit:     Results for orders placed during the hospital encounter of 07/10/10  URINALYSIS, ROUTINE W REFLEX MICROSCOPIC      Component Value Range   Color, Urine RED BIOCHEMICALS MAY BE AFFECTED BY COLOR (*) YELLOW   APPearance HAZY (*) CLEAR   Specific Gravity, Urine  1.025  1.005 - 1.030   pH 6.5  5.0 - 8.0   Glucose, UA 100 (*) NEGATIVE mg/dL   Hgb urine dipstick LARGE (*) NEGATIVE   Bilirubin Urine NEGATIVE  NEGATIVE   Ketones, ur 15 (*) NEGATIVE mg/dL   Protein, ur >161 (*) NEGATIVE mg/dL   Urobilinogen, UA 4.0 (*) 0.0 - 1.0 mg/dL   Nitrite POSITIVE (*) NEGATIVE   Leukocytes, UA MODERATE (*) NEGATIVE  URINE MICROSCOPIC-ADD ON      Component Value Range   WBC, UA 11-20  <3 WBC/hpf   RBC / HPF TOO NUMEROUS TO COUNT  <3 RBC/hpf   Bacteria, UA FEW (*) RARE    EKG Orders placed in visit on 02/24/12  . EKG 12-LEAD     Prior Assessment and Plan Problem List as of 02/24/2012            Cardiology Problems   HYPERLIPIDEMIA   HYPERTENSION   Last Assessment & Plan Note   03/16/2011 Office Visit Signed 03/16/2011  2:31 PM by Jodelle Gross, NP    Blood pressure is mildly elevated today, but she states she is nervous today. Repeat of BP shows 138/88.  Will  not make any changes in the medication regimen at this time. Palpitations are not symptomatic. I have recommended magnesium oxide 400 mg BID to assist with this and to eliminate caffeine altogether.     SYNCOPE     Other   ANXIETY   DEPRESSION   GASTROESOPHAGEAL REFLUX DISEASE   FIBROMYALGIA   HYPERGLYCEMIA, FASTING       Imaging: No results found.   FRS Calculation: Score not calculated. Missing: Total Cholesterol, HDL

## 2012-02-24 NOTE — Progress Notes (Signed)
   HPI: [name] is a 71 year old Caucasian female patient of Dr. Dietrich Morgan we are following for ongoing assessment of hypertension and palpitations. She is followed by Dr. looking for hypercholesterolemia and anxiety. She comes today after one year for annual assessment. She has had episodes of rapid heart rhythm and rate on occasion. She has stopped taking her metoprolol for periods of time and she would run out of her prescription. She is here today for refills. She has had a recent family stress her with her husband being diagnosed with lung cancer and going back and forth to [facility] for treatment. She states her husband is cancer free and she is now able to begin to focus on her health issues. She has not followed with her primary care physician this year at all. She plans to make an appointment with him soon. She has not had any issues of dizziness chest pain shortness of breath, ER visits or admissions since being seen last.  Allergies  Allergen Reactions  . Codeine     REACTION: Adverse gastrointestinal effects    Current Outpatient Prescriptions  Medication Sig Dispense Refill  . acetaminophen (TYLENOL) 500 MG tablet Take 1,000 mg by mouth once as needed. For headache/pain      . calcium carbonate (OS-CAL) 600 MG TABS Take 600 mg by mouth daily.        [name] latanoprost (XALATAN) 0.005 % ophthalmic solution Place 1 drop into both eyes at bedtime.      [name] LORazepam (ATIVAN) 1 MG tablet Take 1 mg by mouth as needed. For sleep      . magnesium oxide (MAG-OX) 400 MG tablet Take 400 mg by mouth 2 (two) times daily.        . metoprolol succinate (TOPROL-XL) 50 MG 24 hr tablet Take 1 tablet (50 mg total) by mouth daily.  90 tablet  3  . pantoprazole (PROTONIX) 40 MG tablet Take 40 mg by mouth daily.      [name] PARoxetine (PAXIL) 30 MG tablet Take 30 mg by mouth at bedtime.       . [DISCONTINUED] metoprolol succinate (TOPROL-XL) 50 MG 24 hr tablet Take 1 tablet (50 mg total) by mouth daily.  90 tablet  0     Past Medical History  Diagnosis Date  . Hyperlipidemia   . Palpitations   . Anxiety   . Fibromyalgia     ZOX:WRUEAV of systems complete and found to be negative unless listed above  PHYSICAL EXAM BP 152/74  Pulse 67  Ht 5' (1.524 m)  Wt 172 lb (78.019 kg)  BMI 33.59 kg/m2  SpO2 95%  General: Well developed, well nourished, in no acute distress, trembling Head: Eyes PERRLA, No xanthomas.   Normal cephalic and atramatic  Lungs: Clear bilaterally to auscultation and percussion. Heart: HRRR S1 S2, without MRG.  Pulses are 2+ & equal.            No carotid bruit. No JVD.  No abdominal bruits. No femoral bruits. Abdomen: Bowel sounds are positive, abdomen soft and non-tender without masses or                  Hernia's noted. Msk:  Back normal, normal gait. Normal strength and tone for age. Extremities: No clubbing, cyanosis or edema.  DP +1 Neuro: Alert and oriented X 3. Psych:  Good affect, responds appropriately  EKG:NSR rate of 63 bpm.  ASSESSMENT AND PLAN

## 2012-02-24 NOTE — Assessment & Plan Note (Signed)
Blood pressure is elevated on this visit at 152/74 in triaged. I have repeated her blood pressure evaluation manually in the clinic area and found it to be 138/68. We will give her refills on metoprolol 50 mg daily. She is also to have labs a. CMET, CBC, TSH,and Hgb A1C with copies to Dr.Scott Luking.

## 2012-02-24 NOTE — Assessment & Plan Note (Signed)
She has not been seen by her primary care physician for over a year. We will have fasting lipids completed with results sent to her PCP.

## 2012-02-24 NOTE — Patient Instructions (Addendum)
Your physician recommends that you schedule a follow-up appointment in: 1 year  Your physician recommends that you return for lab work in: within the week   

## 2012-02-27 DIAGNOSIS — E782 Mixed hyperlipidemia: Secondary | ICD-10-CM | POA: Diagnosis not present

## 2012-02-27 DIAGNOSIS — R7309 Other abnormal glucose: Secondary | ICD-10-CM | POA: Diagnosis not present

## 2012-02-27 DIAGNOSIS — R6889 Other general symptoms and signs: Secondary | ICD-10-CM | POA: Diagnosis not present

## 2012-02-27 DIAGNOSIS — R002 Palpitations: Secondary | ICD-10-CM | POA: Diagnosis not present

## 2012-02-27 DIAGNOSIS — R55 Syncope and collapse: Secondary | ICD-10-CM | POA: Diagnosis not present

## 2012-02-27 LAB — LIPID PANEL
Cholesterol: 358 mg/dL — ABNORMAL HIGH (ref 0–200)
HDL: 39 mg/dL — ABNORMAL LOW (ref >39)
Total CHOL/HDL Ratio: 9.2 Ratio

## 2012-02-27 LAB — COMPREHENSIVE METABOLIC PANEL
Albumin: 3.9 g/dL (ref 3.5–5.2)
Alkaline Phosphatase: 87 U/L (ref 39–117)
BUN: 9 mg/dL (ref 6–23)
CO2: 28 mEq/L (ref 19–32)
Glucose, Bld: 103 mg/dL — ABNORMAL HIGH (ref 70–99)
Potassium: 5.7 mEq/L — ABNORMAL HIGH (ref 3.5–5.3)

## 2012-02-27 LAB — CBC
MCV: 87.2 fL (ref 78.0–100.0)
RDW: 14 % (ref 11.5–15.5)

## 2012-02-27 LAB — TSH: TSH: 0.95 u[IU]/mL (ref 0.350–4.500)

## 2012-02-27 LAB — HEMOGLOBIN A1C: Mean Plasma Glucose: 128 mg/dL — ABNORMAL HIGH (ref <117)

## 2012-03-20 ENCOUNTER — Other Ambulatory Visit: Payer: Self-pay | Admitting: Family Medicine

## 2012-03-20 ENCOUNTER — Ambulatory Visit (HOSPITAL_COMMUNITY)
Admission: RE | Admit: 2012-03-20 | Discharge: 2012-03-20 | Disposition: A | Discharge status: Home / Self Care | Payer: Medicare Other | Source: Ambulatory Visit | Attending: Family Medicine | Admitting: Family Medicine

## 2012-03-20 DIAGNOSIS — M25562 Pain in left knee: Secondary | ICD-10-CM

## 2012-03-20 DIAGNOSIS — M542 Cervicalgia: Secondary | ICD-10-CM

## 2012-03-20 DIAGNOSIS — M5030 Other cervical disc degeneration, unspecified cervical region: Secondary | ICD-10-CM | POA: Diagnosis not present

## 2012-03-20 DIAGNOSIS — M2550 Pain in unspecified joint: Secondary | ICD-10-CM | POA: Diagnosis not present

## 2012-03-20 DIAGNOSIS — M47812 Spondylosis without myelopathy or radiculopathy, cervical region: Secondary | ICD-10-CM | POA: Diagnosis not present

## 2012-03-20 DIAGNOSIS — M25569 Pain in unspecified knee: Secondary | ICD-10-CM | POA: Diagnosis not present

## 2012-03-20 DIAGNOSIS — E785 Hyperlipidemia, unspecified: Secondary | ICD-10-CM | POA: Diagnosis not present

## 2012-03-21 DIAGNOSIS — H40059 Ocular hypertension, unspecified eye: Secondary | ICD-10-CM | POA: Diagnosis not present

## 2012-03-21 DIAGNOSIS — H0230 Blepharochalasis unspecified eye, unspecified eyelid: Secondary | ICD-10-CM | POA: Diagnosis not present

## 2012-03-21 DIAGNOSIS — H2510 Age-related nuclear cataract, unspecified eye: Secondary | ICD-10-CM | POA: Diagnosis not present

## 2012-05-19 ENCOUNTER — Other Ambulatory Visit: Payer: Self-pay

## 2012-05-21 ENCOUNTER — Other Ambulatory Visit: Payer: Self-pay | Admitting: *Deleted

## 2012-05-21 MED ORDER — METOPROLOL SUCCINATE ER 50 MG PO TB24: 50.0000 mg | ORAL_TABLET | Freq: Every day | ORAL | Status: DC

## 2012-06-26 DIAGNOSIS — Z1231 Encounter for screening mammogram for malignant neoplasm of breast: Secondary | ICD-10-CM | POA: Diagnosis not present

## 2012-06-28 DIAGNOSIS — N63 Unspecified lump in unspecified breast: Secondary | ICD-10-CM | POA: Diagnosis not present

## 2012-07-02 ENCOUNTER — Other Ambulatory Visit: Payer: Self-pay

## 2012-07-02 DIAGNOSIS — D249 Benign neoplasm of unspecified breast: Secondary | ICD-10-CM | POA: Diagnosis not present

## 2012-07-02 DIAGNOSIS — N63 Unspecified lump in unspecified breast: Secondary | ICD-10-CM | POA: Diagnosis not present

## 2012-07-27 ENCOUNTER — Telehealth: Payer: Self-pay | Admitting: *Deleted

## 2012-07-27 NOTE — Telephone Encounter (Signed)
Needs refill of Ativan to [facility]. -Completely out.

## 2012-07-27 NOTE — Telephone Encounter (Signed)
Med called into [place] pharmacy. Patient notified.

## 2012-07-27 NOTE — Telephone Encounter (Signed)
Refill times 3 

## 2012-09-04 ENCOUNTER — Encounter (HOSPITAL_COMMUNITY): Payer: Self-pay | Admitting: *Deleted

## 2012-09-04 ENCOUNTER — Emergency Department (HOSPITAL_COMMUNITY)
Admission: EM | Admit: 2012-09-04 | Discharge: 2012-09-05 | Disposition: A | Discharge status: Home / Self Care | Payer: Medicare Other | Attending: Emergency Medicine | Admitting: Emergency Medicine

## 2012-09-04 DIAGNOSIS — Z79899 Other long term (current) drug therapy: Secondary | ICD-10-CM | POA: Diagnosis not present

## 2012-09-04 DIAGNOSIS — IMO0001 Reserved for inherently not codable concepts without codable children: Secondary | ICD-10-CM | POA: Diagnosis not present

## 2012-09-04 DIAGNOSIS — F411 Generalized anxiety disorder: Secondary | ICD-10-CM | POA: Diagnosis not present

## 2012-09-04 DIAGNOSIS — M25569 Pain in unspecified knee: Secondary | ICD-10-CM | POA: Diagnosis not present

## 2012-09-04 DIAGNOSIS — I82403 Acute embolism and thrombosis of unspecified deep veins of lower extremity, bilateral: Secondary | ICD-10-CM

## 2012-09-04 DIAGNOSIS — Z8679 Personal history of other diseases of the circulatory system: Secondary | ICD-10-CM | POA: Insufficient documentation

## 2012-09-04 DIAGNOSIS — Z862 Personal history of diseases of the blood and blood-forming organs and certain disorders involving the immune mechanism: Secondary | ICD-10-CM | POA: Diagnosis not present

## 2012-09-04 DIAGNOSIS — M25559 Pain in unspecified hip: Secondary | ICD-10-CM | POA: Diagnosis not present

## 2012-09-04 DIAGNOSIS — K219 Gastro-esophageal reflux disease without esophagitis: Secondary | ICD-10-CM | POA: Diagnosis not present

## 2012-09-04 DIAGNOSIS — I82409 Acute embolism and thrombosis of unspecified deep veins of unspecified lower extremity: Secondary | ICD-10-CM | POA: Diagnosis not present

## 2012-09-04 DIAGNOSIS — M25552 Pain in left hip: Secondary | ICD-10-CM

## 2012-09-04 DIAGNOSIS — Z8639 Personal history of other endocrine, nutritional and metabolic disease: Secondary | ICD-10-CM | POA: Insufficient documentation

## 2012-09-04 HISTORY — DX: Gastro-esophageal reflux disease without esophagitis: K21.9

## 2012-09-04 NOTE — ED Notes (Signed)
Pt reporting pain in left leg.  Reports pain began this afternoon, and she wants to make sure she doesn't have a blood clot.  Denies history of clots.

## 2012-09-05 ENCOUNTER — Ambulatory Visit (HOSPITAL_COMMUNITY)
Admit: 2012-09-05 | Discharge: 2012-09-05 | Disposition: A | Discharge status: Home / Self Care | Payer: Medicare Other | Source: Ambulatory Visit | Attending: Emergency Medicine | Admitting: Emergency Medicine

## 2012-09-05 DIAGNOSIS — M79609 Pain in unspecified limb: Secondary | ICD-10-CM | POA: Diagnosis not present

## 2012-09-05 DIAGNOSIS — I82403 Acute embolism and thrombosis of unspecified deep veins of lower extremity, bilateral: Secondary | ICD-10-CM

## 2012-09-05 LAB — BASIC METABOLIC PANEL
BUN: 14 mg/dL (ref 6–23)
CO2: 26 mEq/L (ref 19–32)
Calcium: 9.2 mg/dL (ref 8.4–10.5)
Chloride: 102 mEq/L (ref 96–112)
Creatinine, Ser: 0.85 mg/dL (ref 0.50–1.10)
GFR calc Af Amer: 78 mL/min — ABNORMAL LOW (ref >90)
GFR calc non Af Amer: 67 mL/min — ABNORMAL LOW (ref >90)
Glucose, Bld: 90 mg/dL (ref 70–99)
Potassium: 3.8 mEq/L (ref 3.5–5.1)
Sodium: 137 mEq/L (ref 135–145)

## 2012-09-05 LAB — D-DIMER, QUANTITATIVE: D-Dimer, Quant: 0.62 ug/mL-FEU — ABNORMAL HIGH (ref 0.00–0.48)

## 2012-09-05 MED ORDER — IBUPROFEN 400 MG PO TABS
600.0000 mg | ORAL_TABLET | Freq: Once | ORAL | Status: AC
  Administered 2012-09-05: 600 mg via ORAL
  Filled 2012-09-05: qty 2

## 2012-09-05 MED ORDER — ENOXAPARIN SODIUM 100 MG/ML ~~LOC~~ SOLN
75.0000 mg | Freq: Once | SUBCUTANEOUS | Status: AC
  Administered 2012-09-05: 75 mg via SUBCUTANEOUS
  Filled 2012-09-05: qty 1

## 2012-09-05 NOTE — ED Notes (Signed)
MD at bedside. 

## 2012-09-05 NOTE — ED Provider Notes (Signed)
History     CSN: [id number]  Arrival date & time 09/04/12  2235   First MD Initiated Contact with Patient 09/05/12 0011      No chief complaint on file.  HPI Jacqueline Morgan is a 72 y.o. female presenting with left hip pain. Is worse on palpation worse on certain positions, worse on walking, is on the lateral aspect of her hip the greater trochanter. Patient is concerned because she thinks she may have a blood clot.  Patient has no history of venous thromboembolic disease, no history of long or prolonged travel, no long bone fractures or trauma, no cancer treatments, no exogenous estrogens. Patient has no chest pain, no shortness of breath, no hemoptysis.   Past Medical History  Diagnosis Date  . Hyperlipidemia   . Palpitations   . Anxiety   . Fibromyalgia   . GERD (gastroesophageal reflux disease)     History reviewed. No pertinent past surgical history.  History reviewed. No pertinent family history.  History  Substance Use Topics  . Smoking status: Never Smoker   . Smokeless tobacco: Not on file  . Alcohol Use: No    OB History   Grav Para Term Preterm Abortions TAB SAB Ect Mult Living                  Review of Systems At least 10pt or greater review of systems completed and are negative except where specified in the HPI.  Allergies  Codeine  Home Medications   Current Outpatient Rx  Name  Route  Sig  Dispense  Refill  . acetaminophen (TYLENOL) 500 MG tablet   Oral   Take 1,000 mg by mouth once as needed. For headache/pain         . calcium carbonate (OS-CAL) 600 MG TABS   Oral   Take 600 mg by mouth daily.           [name] latanoprost (XALATAN) 0.005 % ophthalmic solution   Both Eyes   Place 1 drop into both eyes at bedtime.         [name] LORazepam (ATIVAN) 1 MG tablet   Oral   Take 1 mg by mouth as needed. For sleep         . magnesium oxide (MAG-OX) 400 MG tablet   Oral   Take 400 mg by mouth 2 (two) times daily.           . metoprolol  succinate (TOPROL-XL) 50 MG 24 hr tablet   Oral   Take 1 tablet (50 mg total) by mouth daily.   90 tablet   3     Patient needs to call office and schedule an appoi ...   . pantoprazole (PROTONIX) 40 MG tablet   Oral   Take 40 mg by mouth daily.         [name] PARoxetine (PAXIL) 30 MG tablet   Oral   Take 30 mg by mouth at bedtime.            BP 180/102  Pulse 69  Temp(Src) 97.7 F (36.5 C) (Oral)  Resp 18  Ht 5' (1.524 m)  Wt 165 lb (74.844 kg)  BMI 32.22 kg/m2  SpO2 95%  Physical Exam  Nursing notes reviewed.  Electronic medical record reviewed. VITAL SIGNS:   Filed Vitals:   09/04/12 2255 09/04/12 2256 09/05/12 0203  BP:  180/102 141/68  Pulse:  69 67  Temp:  97.7 F (36.5 C)   TempSrc:  Oral   Resp:  18 20  Height: 5' (1.524 m)    Weight: 165 lb (74.844 kg)    SpO2:  95% 92%   CONSTITUTIONAL: Awake, oriented, appears non-toxic HENT: Atraumatic, normocephalic, oral mucosa pink and moist, airway patent. Nares patent without drainage. External ears normal. EYES: Conjunctiva clear, EOMI, PERRLA NECK: Trachea midline, non-tender, supple CARDIOVASCULAR: Normal heart rate, Normal rhythm, No murmurs, rubs, gallops PULMONARY/CHEST: Clear to auscultation, no rhonchi, wheezes, or rales. Symmetrical breath sounds. Non-tender. ABDOMINAL: Non-distended, soft, non-tender - no rebound or guarding.  BS normal. NEUROLOGIC: Non-focal, moving all four extremities, no gross sensory or motor deficits. EXTREMITIES: No clubbing, cyanosis, or edema. Tenderness over left greater trochanter to palpation. SKIN: Warm, Dry, No erythema, No rash  ED Course  Procedures (including critical care time)  Labs Reviewed  D-DIMER, QUANTITATIVE - Abnormal; Notable for the following:    D-Dimer, Quant 0.62 (*)    All other components within normal limits  BASIC METABOLIC PANEL - Abnormal; Notable for the following:    GFR calc non Af Amer 67 (*)    GFR calc Af Amer 78 (*)    All other  components within normal limits   No results found.   1. Left hip pain   2. DVT (deep venous thrombosis), bilateral       MDM  I think the patient likely has trochanteric bursitis, however, patient is very concerned about DVT, she is low risk by Wells criteria. Family insists on testing, perform d-dimer which was positive, but let them know that this is a nonspecific test - will treat patient with Lovenox tonight and have them return tomorrow for bilateral ultrasound testing of her deep vein system. Understand and accept the medical plan as it's been dictated, understands risks of blood thinners.        Jones Skene, MD 09/05/12 [phone]

## 2012-09-20 ENCOUNTER — Other Ambulatory Visit: Payer: Self-pay | Admitting: *Deleted

## 2012-09-20 MED ORDER — PANTOPRAZOLE SODIUM 40 MG PO TBEC: DELAYED_RELEASE_TABLET | ORAL | Status: DC

## 2012-11-07 ENCOUNTER — Other Ambulatory Visit: Payer: Self-pay

## 2012-12-09 ENCOUNTER — Encounter (HOSPITAL_COMMUNITY): Payer: Self-pay

## 2012-12-09 ENCOUNTER — Emergency Department (HOSPITAL_COMMUNITY)
Admission: EM | Admit: 2012-12-09 | Discharge: 2012-12-09 | Disposition: A | Discharge status: Home / Self Care | Payer: Medicare Other | Attending: Emergency Medicine | Admitting: Emergency Medicine

## 2012-12-09 ENCOUNTER — Emergency Department (HOSPITAL_COMMUNITY): Payer: Medicare Other

## 2012-12-09 DIAGNOSIS — R0602 Shortness of breath: Secondary | ICD-10-CM | POA: Diagnosis not present

## 2012-12-09 DIAGNOSIS — K219 Gastro-esophageal reflux disease without esophagitis: Secondary | ICD-10-CM | POA: Insufficient documentation

## 2012-12-09 DIAGNOSIS — Z862 Personal history of diseases of the blood and blood-forming organs and certain disorders involving the immune mechanism: Secondary | ICD-10-CM | POA: Diagnosis not present

## 2012-12-09 DIAGNOSIS — R52 Pain, unspecified: Secondary | ICD-10-CM | POA: Insufficient documentation

## 2012-12-09 DIAGNOSIS — IMO0001 Reserved for inherently not codable concepts without codable children: Secondary | ICD-10-CM | POA: Insufficient documentation

## 2012-12-09 DIAGNOSIS — M7989 Other specified soft tissue disorders: Secondary | ICD-10-CM | POA: Insufficient documentation

## 2012-12-09 DIAGNOSIS — R071 Chest pain on breathing: Secondary | ICD-10-CM | POA: Insufficient documentation

## 2012-12-09 DIAGNOSIS — Z79899 Other long term (current) drug therapy: Secondary | ICD-10-CM | POA: Insufficient documentation

## 2012-12-09 DIAGNOSIS — F411 Generalized anxiety disorder: Secondary | ICD-10-CM | POA: Insufficient documentation

## 2012-12-09 DIAGNOSIS — R0789 Other chest pain: Secondary | ICD-10-CM

## 2012-12-09 DIAGNOSIS — Z8639 Personal history of other endocrine, nutritional and metabolic disease: Secondary | ICD-10-CM | POA: Insufficient documentation

## 2012-12-09 DIAGNOSIS — R079 Chest pain, unspecified: Secondary | ICD-10-CM | POA: Diagnosis not present

## 2012-12-09 LAB — CBC WITH DIFFERENTIAL/PLATELET
Basophils Relative: 0 % (ref 0–1)
Eosinophils Absolute: 0.2 10*3/uL (ref 0.0–0.7)
Eosinophils Relative: 2 % (ref 0–5)
Hemoglobin: 13.4 g/dL (ref 12.0–15.0)
MCH: 29.9 pg (ref 26.0–34.0)
MCHC: 33.3 g/dL (ref 30.0–36.0)
MCV: 90 fL (ref 78.0–100.0)
Monocytes Relative: 10 % (ref 3–12)
Neutrophils Relative %: 70 % (ref 43–77)
Platelets: 250 10*3/uL (ref 150–400)

## 2012-12-09 LAB — URINE MICROSCOPIC-ADD ON

## 2012-12-09 LAB — BASIC METABOLIC PANEL
BUN: 10 mg/dL (ref 6–23)
Calcium: 9.1 mg/dL (ref 8.4–10.5)
GFR calc Af Amer: >90 mL/min (ref >90)
GFR calc non Af Amer: 85 mL/min — ABNORMAL LOW (ref >90)
Glucose, Bld: 105 mg/dL — ABNORMAL HIGH (ref 70–99)
Potassium: 3.7 mEq/L (ref 3.5–5.1)

## 2012-12-09 LAB — URINALYSIS, ROUTINE W REFLEX MICROSCOPIC
Hgb urine dipstick: NEGATIVE
Nitrite: NEGATIVE
Protein, ur: NEGATIVE mg/dL
Urobilinogen, UA: 1 mg/dL (ref 0.0–1.0)

## 2012-12-09 LAB — TROPONIN I: Troponin I: <0.3 ng/mL (ref <0.30)

## 2012-12-09 MED ORDER — HYDROMORPHONE HCL PF 1 MG/ML IJ SOLN
0.5000 mg | Freq: Once | INTRAMUSCULAR | Status: AC
  Administered 2012-12-09: 0.5 mg via INTRAVENOUS
  Filled 2012-12-09: qty 1

## 2012-12-09 MED ORDER — ONDANSETRON HCL 4 MG/2ML IJ SOLN
4.0000 mg | Freq: Once | INTRAMUSCULAR | Status: AC
  Administered 2012-12-09: 4 mg via INTRAVENOUS
  Filled 2012-12-09: qty 2

## 2012-12-09 MED ORDER — KETOROLAC TROMETHAMINE 30 MG/ML IJ SOLN
30.0000 mg | Freq: Once | INTRAMUSCULAR | Status: AC
  Administered 2012-12-09: 30 mg via INTRAVENOUS
  Filled 2012-12-09: qty 1

## 2012-12-09 NOTE — ED Notes (Signed)
Pt reports waking at 6am with right side chest wall pain and sob.  Denies any cough, congestion or fever. Pt reports being sob "all the time lately" and has not been to her doctor.

## 2012-12-09 NOTE — ED Provider Notes (Signed)
CSN: [id number]     Arrival date & time 12/09/12  0813 History   This chart was scribed for Donnetta Hutching, MD, by Yevette Edwards, ED Scribe. This patient was seen in room APA02/APA02 and the patient's care was started at 8:48 AM. First MD Initiated Contact with Patient 12/09/12 [id number]     Chief Complaint  Patient presents with  . Shortness of Breath   (Consider location/radiation/quality/duration/timing/severity/associated sxs/prior Treatment) The history is provided by the patient. No language interpreter was used.   HPI Comments: Jacqueline Morgan is a 72 y.o. female who presents to the Emergency Department complaining of acute, lateral right-sided chest pain which radiates to her right breast; she reports that the onset was sudden and awakened her this morning. She describes the pain as "sharp," and she rates it as 810.  She reports that she has experienced SOB associated with the chest pain, and she states that the SOB is increased from her baseline SOB. She has also experienced mild lower leg swelling. The pt denies any cough, congestion, or fever.   Dr. Roswell Miners is her PCP.  Past Medical History  Diagnosis Date  . Hyperlipidemia   . Palpitations   . Anxiety   . Fibromyalgia   . GERD (gastroesophageal reflux disease)    History reviewed. No pertinent past surgical history. No family history on file. History  Substance Use Topics  . Smoking status: Never Smoker   . Smokeless tobacco: Not on file  . Alcohol Use: No   No OB history provided.  Review of Systems  Constitutional: Negative for fever and chills.  HENT: Negative for congestion, rhinorrhea, sneezing and sinus pressure.   Respiratory: Positive for shortness of breath. Negative for cough.   Cardiovascular: Positive for chest pain and leg swelling.  All other systems reviewed and are negative.   Allergies  Codeine  Home Medications   Current Outpatient Rx  Name  Route  Sig  Dispense  Refill  . acetaminophen  (TYLENOL) 500 MG tablet   Oral   Take 1,000 mg by mouth once as needed. For headache/pain         . calcium carbonate (OS-CAL) 600 MG TABS   Oral   Take 600 mg by mouth daily.           [name] latanoprost (XALATAN) 0.005 % ophthalmic solution   Both Eyes   Place 1 drop into both eyes at bedtime.         [name] LORazepam (ATIVAN) 1 MG tablet   Oral   Take 1 mg by mouth as needed. For sleep         . magnesium oxide (MAG-OX) 400 MG tablet   Oral   Take 400 mg by mouth 2 (two) times daily.           . metoprolol succinate (TOPROL-XL) 50 MG 24 hr tablet   Oral   Take 1 tablet (50 mg total) by mouth daily.   90 tablet   3     Patient needs to call office and schedule an appoi ...   . pantoprazole (PROTONIX) 40 MG tablet      One tab twice a day   180 tablet   3   . PARoxetine (PAXIL) 30 MG tablet   Oral   Take 30 mg by mouth at bedtime.           Triage Vitals:  BP 151/69  Pulse 72  Temp(Src) 97.9 F (36.6 C) (Oral)  Resp  22  SpO2 96%  Physical Exam  Nursing note and vitals reviewed. Constitutional: She is oriented to person, place, and time. She appears well-developed and well-nourished.  HENT:  Head: Normocephalic and atraumatic.  Eyes: Conjunctivae and EOM are normal. Pupils are equal, round, and reactive to light.  Neck: Normal range of motion. Neck supple.  Cardiovascular: Normal rate, regular rhythm and normal heart sounds.   Pulmonary/Chest: Effort normal and breath sounds normal.  Abdominal: Soft. Bowel sounds are normal.  Musculoskeletal: Normal range of motion. She exhibits tenderness.  Minimally tender to right lateral chest.   Neurological: She is alert and oriented to person, place, and time.  Skin: Skin is warm and dry.  Psychiatric: She has a normal mood and affect.    ED Course  Procedures (including critical care time)  DIAGNOSTIC STUDIES: Oxygen Saturation is 96% on room air, normal by my interpretation.    COORDINATION OF  CARE:  8:52 AM-Discussed treatment plan with patient, and the patient agreed to the plan.   Labs Review Labs Reviewed  BASIC METABOLIC PANEL - Abnormal; Notable for the following:    Glucose, Bld 105 (*)    GFR calc non Af Amer 85 (*)    All other components within normal limits  URINALYSIS, ROUTINE W REFLEX MICROSCOPIC - Abnormal; Notable for the following:    APPearance HAZY (*)    Specific Gravity, Urine >1.030 (*)    Bilirubin Urine SMALL (*)    Ketones, ur TRACE (*)    Leukocytes, UA SMALL (*)    All other components within normal limits  URINE MICROSCOPIC-ADD ON - Abnormal; Notable for the following:    Squamous Epithelial / LPF FEW (*)    Bacteria, UA FEW (*)    All other components within normal limits  URINE CULTURE  CBC WITH DIFFERENTIAL  TROPONIN I   Imaging Review Dg Chest 2 View  12/09/2012   *RADIOLOGY REPORT*  Clinical Data: Right-sided chest pain, shortness of breath  CHEST - 2 VIEW  Comparison: None.  Findings: Examination is degraded due to patient body habitus.  Borderline enlarged cardiac silhouette and mediastinal contours. Apparent veiling opacities overlying the bilateral lower lungs are favored to be artifactual due to overlying breast tissue.  No focal airspace opacity.  No pleural effusion or pneumothorax.  No evidence of edema.  No acute osseous abnormalities.  IMPRESSION: No acute cardiopulmonary disease.   Original Report Authenticated By: Tacey Ruiz, MD    Date: 12/09/2012  Rate: 68  Rhythm: normal sinus rhythm  QRS Axis: normal  Intervals: normal  ST/T Wave abnormalities: normal  Conduction Disutrbances: none  Narrative Interpretation: unremarkable    MDM  No diagnosis found. Patient is in no acute distress. Chest x-ray, EKG troponin all negative. Patient is tender in right lateral chest wall. She has primary care followup.   I personally performed the services described in this documentation, which was scribed in my presence. The recorded  information has been reviewed and is accurate.    Donnetta Hutching, MD 12/09/12 1357

## 2012-12-10 LAB — URINE CULTURE: Colony Count: 15000

## 2012-12-19 ENCOUNTER — Inpatient Hospital Stay (HOSPITAL_COMMUNITY)
Admission: EM | Admit: 2012-12-19 | Discharge: 2012-12-21 | DRG: 254 | Disposition: A | Discharge status: Home / Self Care | Payer: Medicare Other | Attending: Vascular Surgery | Admitting: Vascular Surgery

## 2012-12-19 ENCOUNTER — Emergency Department (HOSPITAL_COMMUNITY): Payer: Medicare Other

## 2012-12-19 ENCOUNTER — Encounter (HOSPITAL_COMMUNITY)
Admission: EM | Disposition: A | Discharge status: Home / Self Care | Payer: Self-pay | Source: Home / Self Care | Attending: Vascular Surgery

## 2012-12-19 ENCOUNTER — Encounter (HOSPITAL_COMMUNITY): Payer: Self-pay

## 2012-12-19 ENCOUNTER — Inpatient Hospital Stay: Admit: 2012-12-19 | Payer: Self-pay | Admitting: Vascular Surgery

## 2012-12-19 ENCOUNTER — Encounter (HOSPITAL_COMMUNITY): Payer: Self-pay | Admitting: Certified Registered Nurse Anesthetist

## 2012-12-19 ENCOUNTER — Emergency Department (HOSPITAL_COMMUNITY): Payer: Medicare Other | Admitting: Certified Registered Nurse Anesthetist

## 2012-12-19 DIAGNOSIS — E785 Hyperlipidemia, unspecified: Secondary | ICD-10-CM | POA: Diagnosis present

## 2012-12-19 DIAGNOSIS — Z8249 Family history of ischemic heart disease and other diseases of the circulatory system: Secondary | ICD-10-CM

## 2012-12-19 DIAGNOSIS — J40 Bronchitis, not specified as acute or chronic: Secondary | ICD-10-CM | POA: Diagnosis not present

## 2012-12-19 DIAGNOSIS — Z79899 Other long term (current) drug therapy: Secondary | ICD-10-CM | POA: Diagnosis not present

## 2012-12-19 DIAGNOSIS — I742 Embolism and thrombosis of arteries of the upper extremities: Secondary | ICD-10-CM

## 2012-12-19 DIAGNOSIS — K219 Gastro-esophageal reflux disease without esophagitis: Secondary | ICD-10-CM | POA: Diagnosis not present

## 2012-12-19 DIAGNOSIS — I999 Unspecified disorder of circulatory system: Secondary | ICD-10-CM | POA: Diagnosis not present

## 2012-12-19 DIAGNOSIS — I700 Atherosclerosis of aorta: Secondary | ICD-10-CM | POA: Diagnosis not present

## 2012-12-19 DIAGNOSIS — F411 Generalized anxiety disorder: Secondary | ICD-10-CM | POA: Diagnosis not present

## 2012-12-19 DIAGNOSIS — M79609 Pain in unspecified limb: Secondary | ICD-10-CM

## 2012-12-19 DIAGNOSIS — I7092 Chronic total occlusion of artery of the extremities: Secondary | ICD-10-CM | POA: Diagnosis not present

## 2012-12-19 DIAGNOSIS — IMO0001 Reserved for inherently not codable concepts without codable children: Secondary | ICD-10-CM | POA: Diagnosis not present

## 2012-12-19 DIAGNOSIS — I519 Heart disease, unspecified: Secondary | ICD-10-CM | POA: Diagnosis not present

## 2012-12-19 DIAGNOSIS — I70208 Unspecified atherosclerosis of native arteries of extremities, other extremity: Secondary | ICD-10-CM

## 2012-12-19 HISTORY — PX: EMBOLECTOMY: SHX44

## 2012-12-19 LAB — PROTIME-INR: INR: 0.86 (ref 0.00–1.49)

## 2012-12-19 LAB — COMPREHENSIVE METABOLIC PANEL
ALT: 32 U/L (ref 0–35)
Albumin: 3.6 g/dL (ref 3.5–5.2)
Calcium: 9.7 mg/dL (ref 8.4–10.5)
GFR calc Af Amer: >90 mL/min (ref >90)
Glucose, Bld: 107 mg/dL — ABNORMAL HIGH (ref 70–99)
Sodium: 141 mEq/L (ref 135–145)
Total Protein: 8.2 g/dL (ref 6.0–8.3)

## 2012-12-19 LAB — CBC WITH DIFFERENTIAL/PLATELET
Basophils Absolute: 0.1 10*3/uL (ref 0.0–0.1)
Basophils Relative: 1 % (ref 0–1)
Eosinophils Absolute: 0.2 10*3/uL (ref 0.0–0.7)
Eosinophils Relative: 4 % (ref 0–5)
Lymphs Abs: 2.4 10*3/uL (ref 0.7–4.0)
MCH: 30 pg (ref 26.0–34.0)
MCHC: 33 g/dL (ref 30.0–36.0)
MCV: 90.8 fL (ref 78.0–100.0)
Platelets: 345 10*3/uL (ref 150–400)
RDW: 14.5 % (ref 11.5–15.5)

## 2012-12-19 LAB — HEPARIN LEVEL (UNFRACTIONATED): Heparin Unfractionated: 0.92 IU/mL — ABNORMAL HIGH (ref 0.30–0.70)

## 2012-12-19 LAB — CBC
Hemoglobin: 12.7 g/dL (ref 12.0–15.0)
MCV: 89.8 fL (ref 78.0–100.0)
Platelets: 328 10*3/uL (ref 150–400)
RBC: 4.23 MIL/uL (ref 3.87–5.11)
WBC: 9.5 10*3/uL (ref 4.0–10.5)

## 2012-12-19 LAB — BASIC METABOLIC PANEL
CO2: 26 mEq/L (ref 19–32)
Chloride: 103 mEq/L (ref 96–112)
Glucose, Bld: 155 mg/dL — ABNORMAL HIGH (ref 70–99)
Sodium: 139 mEq/L (ref 135–145)

## 2012-12-19 SURGERY — EMBOLECTOMY
Anesthesia: General | Site: Arm Upper | Laterality: Left | Wound class: Clean

## 2012-12-19 MED ORDER — LATANOPROST 0.005 % OP SOLN
1.0000 [drp] | Freq: Every day | OPHTHALMIC | Status: DC
  Administered 2012-12-19: 1 [drp] via OPHTHALMIC
  Filled 2012-12-19: qty 2.5

## 2012-12-19 MED ORDER — MIDAZOLAM HCL 5 MG/5ML IJ SOLN
INTRAMUSCULAR | Status: DC | PRN
  Administered 2012-12-19: 2 mg via INTRAVENOUS

## 2012-12-19 MED ORDER — HYDRALAZINE HCL 20 MG/ML IJ SOLN
INTRAMUSCULAR | Status: DC | PRN
  Administered 2012-12-19: 5 mg via INTRAVENOUS

## 2012-12-19 MED ORDER — LABETALOL HCL 5 MG/ML IV SOLN
10.0000 mg | INTRAVENOUS | Status: DC | PRN
  Filled 2012-12-19: qty 4

## 2012-12-19 MED ORDER — PANTOPRAZOLE SODIUM 40 MG PO TBEC
40.0000 mg | DELAYED_RELEASE_TABLET | Freq: Two times a day (BID) | ORAL | Status: DC
  Administered 2012-12-20 – 2012-12-21 (×3): 40 mg via ORAL
  Filled 2012-12-19 (×3): qty 1

## 2012-12-19 MED ORDER — CEFAZOLIN SODIUM-DEXTROSE 2-3 GM-% IV SOLR
INTRAVENOUS | Status: AC
  Filled 2012-12-19: qty 50

## 2012-12-19 MED ORDER — HEPARIN BOLUS VIA INFUSION
4000.0000 [IU] | Freq: Once | INTRAVENOUS | Status: AC
  Administered 2012-12-19: 4000 [IU] via INTRAVENOUS

## 2012-12-19 MED ORDER — DEXTROSE 5 % IV SOLN
1.5000 g | Freq: Two times a day (BID) | INTRAVENOUS | Status: AC
  Administered 2012-12-19 – 2012-12-20 (×2): 1.5 g via INTRAVENOUS
  Filled 2012-12-19 (×3): qty 1.5

## 2012-12-19 MED ORDER — FENTANYL CITRATE 0.05 MG/ML IJ SOLN
INTRAMUSCULAR | Status: DC | PRN
  Administered 2012-12-19: 100 ug via INTRAVENOUS

## 2012-12-19 MED ORDER — SODIUM CHLORIDE 0.9 % IV SOLN: 500.0000 mL | Freq: Once | INTRAVENOUS | Status: AC | PRN

## 2012-12-19 MED ORDER — HEPARIN (PORCINE) IN NACL 100-0.45 UNIT/ML-% IJ SOLN
850.0000 [IU]/h | INTRAMUSCULAR | Status: DC
  Administered 2012-12-19: 1000 [IU]/h via INTRAVENOUS
  Administered 2012-12-19 – 2012-12-20 (×2): 850 [IU]/h via INTRAVENOUS
  Filled 2012-12-19 (×3): qty 250

## 2012-12-19 MED ORDER — MORPHINE SULFATE 2 MG/ML IJ SOLN: 2.0000 mg | INTRAMUSCULAR | Status: DC | PRN

## 2012-12-19 MED ORDER — PROPOFOL 10 MG/ML IV BOLUS
INTRAVENOUS | Status: DC | PRN
  Administered 2012-12-19: 120 mg via INTRAVENOUS

## 2012-12-19 MED ORDER — GUAIFENESIN-DM 100-10 MG/5ML PO SYRP: 15.0000 mL | ORAL_SOLUTION | ORAL | Status: DC | PRN

## 2012-12-19 MED ORDER — METOPROLOL TARTRATE 1 MG/ML IV SOLN: 2.0000 mg | INTRAVENOUS | Status: DC | PRN

## 2012-12-19 MED ORDER — DEXAMETHASONE SODIUM PHOSPHATE 4 MG/ML IJ SOLN
INTRAMUSCULAR | Status: DC | PRN
  Administered 2012-12-19: 8 mg via INTRAVENOUS

## 2012-12-19 MED ORDER — ONDANSETRON HCL 4 MG/2ML IJ SOLN: 4.0000 mg | Freq: Four times a day (QID) | INTRAMUSCULAR | Status: DC | PRN

## 2012-12-19 MED ORDER — LACTATED RINGERS IV SOLN
INTRAVENOUS | Status: DC
  Administered 2012-12-19: 15:00:00 via INTRAVENOUS

## 2012-12-19 MED ORDER — ONDANSETRON HCL 4 MG/2ML IJ SOLN: 4.0000 mg | Freq: Once | INTRAMUSCULAR | Status: DC | PRN

## 2012-12-19 MED ORDER — LORAZEPAM 1 MG PO TABS
1.0000 mg | ORAL_TABLET | Freq: Four times a day (QID) | ORAL | Status: DC | PRN
  Administered 2012-12-20 (×2): 1 mg via ORAL
  Filled 2012-12-19 (×2): qty 1

## 2012-12-19 MED ORDER — SODIUM CHLORIDE 0.9 % IV SOLN
INTRAVENOUS | Status: DC
  Administered 2012-12-19 (×2): via INTRAVENOUS

## 2012-12-19 MED ORDER — ACETAMINOPHEN 650 MG RE SUPP: 325.0000 mg | RECTAL | Status: DC | PRN

## 2012-12-19 MED ORDER — PHENOL 1.4 % MT LIQD: 1.0000 | OROMUCOSAL | Status: DC | PRN

## 2012-12-19 MED ORDER — HYDROMORPHONE HCL PF 1 MG/ML IJ SOLN
INTRAMUSCULAR | Status: AC
  Filled 2012-12-19: qty 1

## 2012-12-19 MED ORDER — THROMBIN 20000 UNITS EX SOLR
CUTANEOUS | Status: DC | PRN
  Administered 2012-12-19: 16:00:00 via TOPICAL

## 2012-12-19 MED ORDER — METOPROLOL SUCCINATE ER 50 MG PO TB24
50.0000 mg | ORAL_TABLET | Freq: Every day | ORAL | Status: DC
  Administered 2012-12-20 – 2012-12-21 (×2): 50 mg via ORAL
  Filled 2012-12-19 (×2): qty 1

## 2012-12-19 MED ORDER — HYDROMORPHONE HCL PF 1 MG/ML IJ SOLN
0.2500 mg | INTRAMUSCULAR | Status: DC | PRN
  Administered 2012-12-19 (×2): 0.5 mg via INTRAVENOUS

## 2012-12-19 MED ORDER — CEFAZOLIN SODIUM-DEXTROSE 2-3 GM-% IV SOLR
2.0000 g | Freq: Once | INTRAVENOUS | Status: AC
  Administered 2012-12-19: 2 g via INTRAVENOUS

## 2012-12-19 MED ORDER — HYDRALAZINE HCL 20 MG/ML IJ SOLN: 10.0000 mg | INTRAMUSCULAR | Status: DC | PRN

## 2012-12-19 MED ORDER — THROMBIN 20000 UNITS EX SOLR
CUTANEOUS | Status: AC
  Filled 2012-12-19: qty 20000

## 2012-12-19 MED ORDER — DOCUSATE SODIUM 100 MG PO CAPS
100.0000 mg | ORAL_CAPSULE | Freq: Every day | ORAL | Status: DC
  Administered 2012-12-20 – 2012-12-21 (×2): 100 mg via ORAL
  Filled 2012-12-19 (×2): qty 1

## 2012-12-19 MED ORDER — 0.9 % SODIUM CHLORIDE (POUR BTL) OPTIME
TOPICAL | Status: DC | PRN
  Administered 2012-12-19: 1000 mL

## 2012-12-19 MED ORDER — LACTATED RINGERS IV SOLN
INTRAVENOUS | Status: DC | PRN
  Administered 2012-12-19: 15:00:00 via INTRAVENOUS

## 2012-12-19 MED ORDER — PHENYLEPHRINE HCL 10 MG/ML IJ SOLN
INTRAMUSCULAR | Status: DC | PRN
  Administered 2012-12-19 (×2): 80 ug via INTRAVENOUS

## 2012-12-19 MED ORDER — ARTIFICIAL TEARS OP OINT
TOPICAL_OINTMENT | OPHTHALMIC | Status: DC | PRN
  Administered 2012-12-19: 1 via OPHTHALMIC

## 2012-12-19 MED ORDER — LIDOCAINE HCL (CARDIAC) 20 MG/ML IV SOLN
INTRAVENOUS | Status: DC | PRN
  Administered 2012-12-19: 65 mg via INTRAVENOUS

## 2012-12-19 MED ORDER — OXYCODONE HCL 5 MG PO TABS
5.0000 mg | ORAL_TABLET | Freq: Once | ORAL | Status: AC | PRN
  Administered 2012-12-19: 5 mg via ORAL

## 2012-12-19 MED ORDER — ACETAMINOPHEN 325 MG PO TABS
325.0000 mg | ORAL_TABLET | ORAL | Status: DC | PRN
  Administered 2012-12-20 (×2): 650 mg via ORAL
  Filled 2012-12-19 (×2): qty 2

## 2012-12-19 MED ORDER — SODIUM CHLORIDE 0.9 % IV SOLN
INTRAVENOUS | Status: DC | PRN
  Administered 2012-12-19: 15:00:00 via INTRAVENOUS

## 2012-12-19 MED ORDER — PAROXETINE HCL 30 MG PO TABS
30.0000 mg | ORAL_TABLET | Freq: Every day | ORAL | Status: DC
  Administered 2012-12-19 – 2012-12-20 (×2): 30 mg via ORAL
  Filled 2012-12-19 (×4): qty 1

## 2012-12-19 MED ORDER — ROCURONIUM BROMIDE 100 MG/10ML IV SOLN
INTRAVENOUS | Status: DC | PRN
  Administered 2012-12-19: 50 mg via INTRAVENOUS

## 2012-12-19 MED ORDER — OXYCODONE HCL 5 MG PO TABS
ORAL_TABLET | ORAL | Status: AC
  Filled 2012-12-19: qty 1

## 2012-12-19 MED ORDER — ALUM & MAG HYDROXIDE-SIMETH 200-200-20 MG/5ML PO SUSP: 15.0000 mL | ORAL | Status: DC | PRN

## 2012-12-19 MED ORDER — OXYCODONE-ACETAMINOPHEN 5-325 MG PO TABS
1.0000 | ORAL_TABLET | ORAL | Status: DC | PRN
  Administered 2012-12-19: 2 via ORAL
  Administered 2012-12-20: 1 via ORAL
  Filled 2012-12-19: qty 1

## 2012-12-19 MED ORDER — OXYCODONE HCL 5 MG/5ML PO SOLN: 5.0000 mg | Freq: Once | ORAL | Status: AC | PRN

## 2012-12-19 MED ORDER — GLYCOPYRROLATE 0.2 MG/ML IJ SOLN
INTRAMUSCULAR | Status: DC | PRN
  Administered 2012-12-19: .8 mg via INTRAVENOUS

## 2012-12-19 MED ORDER — SODIUM CHLORIDE 0.9 % IV SOLN
10.0000 mg | INTRAVENOUS | Status: DC | PRN
  Administered 2012-12-19: 20 ug/min via INTRAVENOUS

## 2012-12-19 MED ORDER — POTASSIUM CHLORIDE CRYS ER 20 MEQ PO TBCR: 20.0000 meq | EXTENDED_RELEASE_TABLET | Freq: Every day | ORAL | Status: DC | PRN

## 2012-12-19 MED ORDER — SODIUM CHLORIDE 0.9 % IR SOLN
Status: DC | PRN
  Administered 2012-12-19: 15:00:00

## 2012-12-19 MED ORDER — ONDANSETRON HCL 4 MG/2ML IJ SOLN
INTRAMUSCULAR | Status: DC | PRN
  Administered 2012-12-19: 4 mg via INTRAVENOUS

## 2012-12-19 MED ORDER — OXYCODONE-ACETAMINOPHEN 5-325 MG PO TABS
ORAL_TABLET | ORAL | Status: AC
  Filled 2012-12-19: qty 2

## 2012-12-19 MED ORDER — NEOSTIGMINE METHYLSULFATE 1 MG/ML IJ SOLN
INTRAMUSCULAR | Status: DC | PRN
  Administered 2012-12-19: 5 mg via INTRAVENOUS

## 2012-12-19 SURGICAL SUPPLY — 38 items
CANISTER SUCTION 2500CC (MISCELLANEOUS) ×2 IMPLANT
CATH EMB 3FR 80CM (CATHETERS) ×1 IMPLANT
CATH EMB 4FR 80CM (CATHETERS) IMPLANT
CATH EMB 5FR 80CM (CATHETERS) IMPLANT
CLIP TI MEDIUM 24 (CLIP) ×2 IMPLANT
CLIP TI WIDE RED SMALL 24 (CLIP) ×2 IMPLANT
COVER SURGICAL LIGHT HANDLE (MISCELLANEOUS) ×2 IMPLANT
CUFF TOURNIQUET SINGLE 18IN (TOURNIQUET CUFF) IMPLANT
CUFF TOURNIQUET SINGLE 24IN (TOURNIQUET CUFF) IMPLANT
CUFF TOURNIQUET SINGLE 34IN LL (TOURNIQUET CUFF) IMPLANT
CUFF TOURNIQUET SINGLE 44IN (TOURNIQUET CUFF) IMPLANT
ELECT REM PT RETURN 9FT ADLT (ELECTROSURGICAL) ×2
ELECTRODE REM PT RTRN 9FT ADLT (ELECTROSURGICAL) ×1 IMPLANT
GLOVE BIO SURGEON STRL SZ 6.5 (GLOVE) ×1 IMPLANT
GLOVE BIO SURGEON STRL SZ7 (GLOVE) ×2 IMPLANT
GLOVE BIOGEL PI IND STRL 7.0 (GLOVE) IMPLANT
GLOVE BIOGEL PI IND STRL 7.5 (GLOVE) ×1 IMPLANT
GLOVE BIOGEL PI INDICATOR 7.0 (GLOVE) ×1
GLOVE BIOGEL PI INDICATOR 7.5 (GLOVE) ×1
GOWN STRL NON-REIN LRG LVL3 (GOWN DISPOSABLE) ×6 IMPLANT
KIT BASIN OR (CUSTOM PROCEDURE TRAY) ×2 IMPLANT
KIT ROOM TURNOVER OR (KITS) ×2 IMPLANT
NS IRRIG 1000ML POUR BTL (IV SOLUTION) ×3 IMPLANT
PACK CV ACCESS (CUSTOM PROCEDURE TRAY) ×1 IMPLANT
PAD ARMBOARD 7.5X6 YLW CONV (MISCELLANEOUS) ×4 IMPLANT
SPONGE GAUZE 4X4 12PLY (GAUZE/BANDAGES/DRESSINGS) ×1 IMPLANT
SPONGE SURGIFOAM ABS GEL 100 (HEMOSTASIS) IMPLANT
SUT MNCRL AB 4-0 PS2 18 (SUTURE) ×2 IMPLANT
SUT PROLENE 5 0 C 1 24 (SUTURE) ×1 IMPLANT
SUT PROLENE 6 0 BV (SUTURE) ×2 IMPLANT
SUT PROLENE 7 0 BV 1 (SUTURE) ×1 IMPLANT
SUT VIC AB 3-0 SH 27 (SUTURE) ×2
SUT VIC AB 3-0 SH 27X BRD (SUTURE) ×1 IMPLANT
TAPE CLOTH SURG 4X10 WHT LF (GAUZE/BANDAGES/DRESSINGS) ×1 IMPLANT
TOWEL OR 17X24 6PK STRL BLUE (TOWEL DISPOSABLE) ×3 IMPLANT
TOWEL OR 17X26 10 PK STRL BLUE (TOWEL DISPOSABLE) ×2 IMPLANT
UNDERPAD 30X30 INCONTINENT (UNDERPADS AND DIAPERS) ×2 IMPLANT
WATER STERILE IRR 1000ML POUR (IV SOLUTION) ×2 IMPLANT

## 2012-12-19 NOTE — Anesthesia Postprocedure Evaluation (Signed)
  Anesthesia Post-op Note  Patient: Jacqueline Morgan  Procedure(s) Performed: Procedure(s): EMBOLECTOMY OF LEFT BRACHIAL ARTERY (Left)  Patient Location: PACU  Anesthesia Type:General  Level of Consciousness: awake and alert   Airway and Oxygen Therapy: Patient Spontanous Breathing and Patient connected to nasal cannula oxygen  Post-op Pain: mild  Post-op Assessment: Post-op Vital signs reviewed, Patient's Cardiovascular Status Stable, Respiratory Function Stable, Patent Airway and No signs of Nausea or vomiting  Post-op Vital Signs: Reviewed and stable  Complications: No apparent anesthesia complications

## 2012-12-19 NOTE — H&P (View-Only) (Signed)
[facility] [place]  Referred by:  [facility]   Reason for referral: left arm ischemia   History of Present Illness  Jacqueline Morgan is a 72 y.o. (12/23/1938) female who presents with chief complaint: left arm pain. The patient had onset of severe (10/10) left arm pain with numbness ~1130. She was seen at the [facility] ED and found to have a cold left arm without any dopplerable signals. ~1 PM she regained some signals and had improvement in her pain. She was transferred to [facility] for evaluation for possible thromboembolectomy. The patient was distraught and unable provide much history. Family is not aware of her medical history. Some of history is obtained from prior medical records. She has had some palpitations recently by report.   Past Medical History  Diagnosis Date  . Hyperlipidemia   . Palpitations   . Anxiety   . Fibromyalgia   . GERD (gastroesophageal reflux disease)    Past Surgical History B breast biopsies and lumpectomies  History   Social History  . Marital Status: Married    Spouse Name: N/A    Number of Children: N/A  . Years of Education: N/A   Occupational History  . Not on file.   Social History Main Topics  . Smoking status: Never Smoker   . Smokeless tobacco: Not on file  . Alcohol Use: No  . Drug Use: No  . Sexual Activity: Not on file   Other Topics Concern  . Not on file   Social History Narrative  . No narrative on file    Family History  Father: MI   No current facility-administered medications on file prior to encounter.   Current Outpatient Prescriptions on File Prior to Encounter  Medication Sig Dispense Refill  . acetaminophen (TYLENOL) 500 MG tablet Take 1,000 mg by mouth once as needed. For headache/pain      . calcium carbonate (OS-CAL) 600 MG TABS Take 600 mg by mouth daily.        . latanoprost (XALATAN) 0.005 % ophthalmic solution Place 1 drop into both eyes at bedtime.      . LORazepam (ATIVAN) 1 MG tablet Take 1 mg  by mouth as needed. For sleep      . magnesium oxide (MAG-OX) 400 MG tablet Take 400 mg by mouth 2 (two) times daily.        . metoprolol succinate (TOPROL-XL) 50 MG 24 hr tablet Take 1 tablet (50 mg total) by mouth daily.  90 tablet  3  . nabumetone (RELAFEN) 500 MG tablet Take 1 tablet by mouth 2 (two) times daily.      . pantoprazole (PROTONIX) 40 MG tablet Take 40 mg by mouth 2 (two) times daily.      . PARoxetine (PAXIL) 30 MG tablet Take 30 mg by mouth at bedtime.        Allergies  Allergen Reactions  . Codeine     REACTION: Adverse gastrointestinal effects   Physical Examination  Filed Vitals:   12/19/12 1210  BP: 158/97  Pulse: 86  Temp: 98.2 F (36.8 C)  TempSrc: Oral  Resp: 22  Height: 5' 1" (1.549 m)  Weight: 168 lb (76.204 kg)  SpO2: 97%   Body mass index is 31.76 kg/(m^2).  General: A&O, frightened, WD, mildly obese   Head: [place]/AT   Ear/Nose/Throat: Hearing grossly intact, nares w/o erythema or drainage, oropharynx w/o Erythema/Exudate, Mallampati score: 3   Eyes: PERRLA, EOMI   Neck: Supple, no nuchal rigidity,   no palpable LAD   Pulmonary: Sym exp, good air movt, CTAB, no rales, rhonchi, & wheezing   Cardiac: RRR with intermittent extra beat, Nl S1, S2, no Murmurs, rubs or gallops,   Vascular:  Vessel  Right  Left   Radial  Palpable  Faintly Palpable   Ulnar  Not Palpable  Not Palpable   Brachial  Palpable  Difficult to Palpate   Carotid  Palpable, without bruit  Palpable, without bruit   Aorta  Not palpable  N/A   Femoral  Palpable  Palpable   Popliteal  Not palpable  Not palpable   PT  Faintly Palpable  Faintly Palpable   DP  Palpable  Palpable    Gastrointestinal: soft, NTND, -G/R, - HSM, - masses, - CVAT B   Musculoskeletal: M/S 5/5 throughout , Extremities without ischemic changes except cool left arm   Neurologic: CN 2-12 intact , Pain and light touch intact in extremities , Motor exam as listed above   Psychiatric: Judgment intact, Mood &  affect appropriate for pt's clinical situation   Dermatologic: See M/S exam for extremity exam, no rashes otherwise noted   Lymph : No Cervical, Axillary, or Inguinal lymphadenopathy   Laboratory: CBC:    Component Value Date/Time   WBC 6.5 12/19/2012 1240   RBC 4.80 12/19/2012 1240   HGB 14.4 12/19/2012 1240   HCT 43.6 12/19/2012 1240   PLT 345 12/19/2012 1240   MCV 90.8 12/19/2012 1240   MCH 30.0 12/19/2012 1240   MCHC 33.0 12/19/2012 1240   RDW 14.5 12/19/2012 1240   LYMPHSABS 2.4 12/19/2012 1240   MONOABS 0.6 12/19/2012 1240   EOSABS 0.2 12/19/2012 1240   BASOSABS 0.1 12/19/2012 1240    BMP:    Component Value Date/Time   NA 141 12/19/2012 1240   K 3.8 12/19/2012 1240   CL 103 12/19/2012 1240   CO2 26 12/19/2012 1240   GLUCOSE 107* 12/19/2012 1240   BUN 10 12/19/2012 1240   CREATININE 0.71 12/19/2012 1240   CREATININE 0.87 02/27/2012 1120   CALCIUM 9.7 12/19/2012 1240   GFRNONAA 85* 12/19/2012 1240   GFRAA >90 12/19/2012 1240    Coagulation: Lab Results  Component Value Date   INR 0.86 12/19/2012   No results found for this basename: PTT   Medical Decision Making  Jacqueline Morgan is a 72 y.o. 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  I recommend: left arm thrombembolectomy.  The risk, benefits, and alternative for a thromboembolectomy were discussed with the patient.  The patient is aware the risks include but are not limited to: bleeding, infection, myocardial infarction, stroke, limb loss, nerve damage, need for additional procedures in the future, and wound complications.  The patient is aware of these risks and agreed to proceed.  Post-operatively, the patient will thromboembolic work-up included: echocardiogram and CTA Chest to evaluate aortic arch for thrombus.  Thank you for allowing us to participate in this patient's care.   Annaliese Saez, MD  [facility] [place]  Office: [phone]  Pager:  [phone]  12/19/2012, 2:26 PM  

## 2012-12-19 NOTE — OR Nursing (Signed)
lue cap refill <3 secs/ warmer after wrapped in blankets/ 2+ radial pulse on d/c from pacu

## 2012-12-19 NOTE — Transfer of Care (Signed)
Immediate Anesthesia Transfer of Care Note  Patient: Jacqueline Morgan  Procedure(s) Performed: Procedure(s): EMBOLECTOMY OF LEFT BRACHIAL ARTERY (Left)  Patient Location: PACU  Anesthesia Type:General  Level of Consciousness: awake, alert , oriented and patient cooperative  Airway & Oxygen Therapy: Patient Spontanous Breathing and Patient connected to nasal cannula oxygen  Post-op Assessment: Report given to PACU RN, Post -op Vital signs reviewed and stable and Patient moving all extremities X 4  Post vital signs: Reviewed and stable  Complications: No apparent anesthesia complications

## 2012-12-19 NOTE — Progress Notes (Signed)
ANTICOAGULATION CONSULT NOTE - Initial Consult  Pharmacy Consult for Heparin Indication: L arm thrombosis  Allergies  Allergen Reactions  . Codeine     REACTION: Adverse gastrointestinal effects   Patient Measurements: Height: 5\' 1"  (154.9 cm) Weight: 168 lb (76.204 kg) IBW/kg (Calculated) : 47.8  Vital Signs: Temp: 98.2 F (36.8 C) (09/17 1210) Temp src: Oral (09/17 1210) BP: 158/97 mmHg (09/17 1210) Pulse Rate: 86 (09/17 1210)  Labs:  Recent Labs  12/19/12 1240  HGB 14.4  HCT 43.6  PLT 345  APTT 27  LABPROT 11.6  INR 0.86  CREATININE 0.71   Estimated Creatinine Clearance: 60.3 ml/min (by C-G formula based on Cr of 0.71).  Medical History: Past Medical History  Diagnosis Date  . Hyperlipidemia   . Palpitations   . Anxiety   . Fibromyalgia   . GERD (gastroesophageal reflux disease)    Medications:  Scheduled:    Assessment: 72yo female with c/o L arm pain and numbness.  Pt has L arm thrombosis and MD has recommended embolectomy.  Pt started on IV Heparin.  Goal of Therapy:  Heparin level 0.3-0.7 units/ml Monitor platelets by anticoagulation protocol: Yes   Plan:  Heparin 4000 unit bolus then 1000 units/hr Check heparin level in 6-8 hrs then daily CBC daily.  Jacqueline Morgan A 12/19/2012,3:22 PM

## 2012-12-19 NOTE — Interval H&P Note (Signed)
[facility] [place]  History and Physical Update  The patient was interviewed and re-examined.  The patient's previous History and Physical has been reviewed and is unchanged from consult.  There is no change in the plan of care: L arm thrombembolectomy.  Leonides Sake, MD [facility] [place] Office: [phone] Pager: [phone]  12/19/2012, 2:48 PM

## 2012-12-19 NOTE — Progress Notes (Signed)
Pt arrived from PACU, desat, back on O2. VSS, will continue to monitor.

## 2012-12-19 NOTE — ED Provider Notes (Signed)
CSN: [id number]     Arrival date & time 12/19/12  1212 History  This chart was scribed for Candyce Churn, MD by Quintella Reichert, ED scribe. This patient was seen in room APA02/APA02 and the patient's care was started at 12:09 PM.    Chief Complaint  Patient presents with  . Arm Pain    Patient is a 72 y.o. female presenting with arm pain. The history is provided by the patient. No language interpreter was used.  Arm Pain This is a new problem. The current episode started less than 1 hour ago. The problem occurs constantly. The problem has not changed since onset.Pertinent negatives include no chest pain and no shortness of breath. Associated symptoms comments: No nausea, vomiting, or diaphoresis. Nothing aggravates the symptoms. Nothing relieves the symptoms. She has tried nothing for the symptoms.    HPI Comments:  Trenell Moxey is a 72 y.o. female with h/o fibromyalgia, high cholesterol, and palpitations who presents to the Emergency Department complaining of severe pain, numbness and tingling to her left arm that she first noticed on waking 1/2 hour ago. Pt reports the pain radiates from the left side of her neck down into her hand.  She also states her hand feels cold.  She denies CP, SOB, nausea, vomiting, or diaphoresis. Daughter reports that pt has been "short of breath lately" prior to today. 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 She denies h/o GI bleeds or bleeding easily. She denies h/o peripheral artery disease diagnosis. She is not on anticoagulants. She does not take aspirin.   Past Medical History  Diagnosis Date  . Hyperlipidemia   . Palpitations   . Anxiety   . Fibromyalgia   . GERD (gastroesophageal reflux disease)     History reviewed. No pertinent past surgical history.   No family history on file.   History  Substance Use Topics  . Smoking status: Never Smoker   . Smokeless tobacco:  Not on file  . Alcohol Use: No    OB History   Grav Para Term Preterm Abortions TAB SAB Ect Mult Living                  Review of Systems  Respiratory: Negative for shortness of breath.   Cardiovascular: Negative for chest pain.  All other systems reviewed and are negative.     Allergies  Codeine  Home Medications   Current Outpatient Rx  Name  Route  Sig  Dispense  Refill  . acetaminophen (TYLENOL) 500 MG tablet   Oral   Take 1,000 mg by mouth once as needed. For headache/pain         . calcium carbonate (OS-CAL) 600 MG TABS   Oral   Take 600 mg by mouth daily.           [name] latanoprost (XALATAN) 0.005 % ophthalmic solution   Both Eyes   Place 1 drop into both eyes at bedtime.         [name] LORazepam (ATIVAN) 1 MG tablet   Oral   Take 1 mg by mouth as needed. For sleep         . magnesium oxide (MAG-OX) 400 MG tablet   Oral   Take 400 mg by mouth 2 (two) times daily.           . metoprolol succinate (TOPROL-XL) 50 MG 24 hr tablet  Oral   Take 1 tablet (50 mg total) by mouth daily.   90 tablet   3     Patient needs to call office and schedule an appoi ...   . nabumetone (RELAFEN) 500 MG tablet   Oral   Take 1 tablet by mouth 2 (two) times daily.         . pantoprazole (PROTONIX) 40 MG tablet   Oral   Take 40 mg by mouth 2 (two) times daily.         [name] PARoxetine (PAXIL) 30 MG tablet   Oral   Take 30 mg by mouth at bedtime.           BP 158/97  Pulse 86  Temp(Src) 98.2 F (36.8 C) (Oral)  Resp 22  Ht 5\' 1"  (1.549 m)  Wt 168 lb (76.204 kg)  BMI 31.76 kg/m2  SpO2 97%  Physical Exam  Nursing note and vitals reviewed. Constitutional: She is oriented to person, place, and time. She appears well-developed and well-nourished. No distress.  HENT:  Head: Normocephalic and atraumatic.  Mouth/Throat: Oropharynx is clear and moist.  Eyes: Conjunctivae are normal. Pupils are equal, round, and reactive to light. No scleral icterus.  Neck:  Neck supple.  Cardiovascular: Normal rate, regular rhythm and normal heart sounds.   No murmur heard. Can't doppler radial, ulnar, or brachial pulses in left upper extremity Can doppler right radial pulses. Can doppler PT pulses in both lower extremities   Pulmonary/Chest: Effort normal and breath sounds normal. No stridor. No respiratory distress. She has no rales.  Abdominal: Soft. Bowel sounds are normal. She exhibits no distension. There is no tenderness.  Musculoskeletal: Normal range of motion.  Neurological: She is alert and oriented to person, place, and time.  Skin: Skin is warm and dry. No rash noted.  Psychiatric: She has a normal mood and affect. Her behavior is normal.    ED Course  CRITICAL CARE Performed by: Blake Divine DAVID Authorized by: Blake Divine DAVID Total critical care time: 35 minutes Critical care time was exclusive of separately billable procedures and treating other patients. Critical care was necessary to treat or prevent imminent or life-threatening deterioration of the following conditions: circulatory failure. Critical care was time spent personally by me on the following activities: development of treatment plan with patient or surrogate, discussions with consultants, evaluation of patient's response to treatment, examination of patient, obtaining history from patient or surrogate, ordering and performing treatments and interventions, ordering and review of laboratory studies, ordering and review of radiographic studies, pulse oximetry, re-evaluation of patient's condition and review of old charts.   (including critical care time)  DIAGNOSTIC STUDIES: Oxygen Saturation is 97% on room air, normal by my interpretation.    COORDINATION OF CARE: 12:17 PM: Discussed treatment plan which includes EKG and labs. Pt expressed understanding and agreed to plan.   Labs Review Labs Reviewed  COMPREHENSIVE METABOLIC PANEL - Abnormal; Notable for the following:     Glucose, Bld 107 (*)    AST 38 (*)    Alkaline Phosphatase 119 (*)    GFR calc non Af Amer 85 (*)    All other components within normal limits  CBC WITH DIFFERENTIAL  APTT  PROTIME-INR  HEPARIN LEVEL (UNFRACTIONATED)   EKG - Sinus rhythm with frequent PVCs, rate 84, left axis deviation, nonspecific T wave changes, compared to prior, PVCs are new.  Imaging Review Dg Chest Port 1 View  12/19/2012   CLINICAL DATA:  Left arm  and neck pain.  EXAM: PORTABLE CHEST - 1 VIEW  COMPARISON:  12/09/2012  FINDINGS: Slight elevation of the right hemidiaphragm. Heart is borderline enlarged. Mild peribronchial thickening. No confluent opacities or visible effusion. No acute bony abnormality.  IMPRESSION: Mild bronchitic changes.   Electronically Signed   By: Charlett Nose M.D.   On: 12/19/2012 13:23  All radiology studies independently viewed by me.     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 Her hand was discolored slightly blue, cool compared to the other, without identifiable pulses. I discussed case with Dr. Hart Rochester who accepted patient in transfer to [facility] for likely emergent embolectomy. She was started on IV heparin.  Prior to transfer, patient had improvement in symptoms. Able to Doppler a weak left radial pulse. Relayed this information to Dr. Hart Rochester, who agreed with continuing current plan.  Transferred   Candyce Churn, MD 12/19/12 1622

## 2012-12-19 NOTE — Progress Notes (Signed)
ANTICOAGULATION CONSULT NOTE - Initial Consult  Pharmacy Consult for heparin Indication: s/p embolectomy  Allergies  Allergen Reactions  . Codeine     REACTION: Adverse gastrointestinal effects    Patient Measurements: Height: 5\' 1"  (154.9 cm) Weight: 168 lb (76.204 kg) IBW/kg (Calculated) : 47.8 Heparin Dosing Weight: 65 kg  Vital Signs: Temp: 97.7 F (36.5 C) (09/17 2031) Temp src: Oral (09/17 2031) BP: 131/60 mmHg (09/17 2031) Pulse Rate: 81 (09/17 2031)  Labs:  Recent Labs  12/19/12 1240 12/19/12 2205  HGB 14.4 12.7  HCT 43.6 38.0  PLT 345 328  APTT 27  --   LABPROT 11.6  --   INR 0.86  --   HEPARINUNFRC  --  0.92*  CREATININE 0.71  --     Estimated Creatinine Clearance: 60.3 ml/min (by C-G formula based on Cr of 0.71).   Medical History: Past Medical History  Diagnosis Date  . Hyperlipidemia   . Palpitations   . Anxiety   . Fibromyalgia   . GERD (gastroesophageal reflux disease)     Medications:  Prescriptions prior to admission  Medication Sig Dispense Refill  . acetaminophen (TYLENOL) 500 MG tablet Take 1,000 mg by mouth once as needed. For headache/pain      . calcium carbonate (OS-CAL) 600 MG TABS Take 600 mg by mouth daily.        [name] ibuprofen (ADVIL,MOTRIN) 200 MG tablet Take 200 mg by mouth every 6 (six) hours as needed for pain.      [name] latanoprost (XALATAN) 0.005 % ophthalmic solution Place 1 drop into both eyes at bedtime.      [name] LORazepam (ATIVAN) 1 MG tablet Take 1 mg by mouth as needed. For sleep      . magnesium oxide (MAG-OX) 400 MG tablet Take 400 mg by mouth 2 (two) times daily.        . metoprolol succinate (TOPROL-XL) 50 MG 24 hr tablet Take 1 tablet (50 mg total) by mouth daily.  90 tablet  3  . nabumetone (RELAFEN) 500 MG tablet Take 1 tablet by mouth 2 (two) times daily.      . pantoprazole (PROTONIX) 40 MG tablet Take 40 mg by mouth 2 (two) times daily.      [name] PARoxetine (PAXIL) 30 MG tablet Take 30 mg by mouth at bedtime.          Assessment: 72 year old woman s/p embolectomy on IV heparin at 1000 units/hr.  Initial heparin level is 0.92. Goal of Therapy:  Heparin level 0.3-0.7 units/ml Monitor platelets by anticoagulation protocol: Yes   Plan:  1. Decrease heparin to 850 units/hr and recheck labs in the morning. 2. Continue daily heparin levels and CBC.  Mickeal Skinner 12/19/2012,10:39 PM

## 2012-12-19 NOTE — Consult Note (Signed)
[facility] [place]  Referred by:  [facility] ED   Reason for referral: left arm ischemia   History of Present Illness  Jacqueline Morgan is a 72 y.o. (12/23/1938) female who presents with chief complaint: left arm pain. The patient had onset of severe (10/10) left arm pain with numbness ~1130. She was seen at the [facility] ED and found to have a cold left arm without any dopplerable signals. ~1 PM she regained some signals and had improvement in her pain. She was transferred to [facility] for evaluation for possible thromboembolectomy. The patient was distraught and unable provide much history. Family is not aware of her medical history. Some of history is obtained from prior medical records. She has had some palpitations recently by report.   Past Medical History  Diagnosis Date  . Hyperlipidemia   . Palpitations   . Anxiety   . Fibromyalgia   . GERD (gastroesophageal reflux disease)    Past Surgical History B breast biopsies and lumpectomies  History   Social History  . Marital Status: Married    Spouse Name: N/A    Number of Children: N/A  . Years of Education: N/A   Occupational History  . Not on file.   Social History Main Topics  . Smoking status: Never Smoker   . Smokeless tobacco: Not on file  . Alcohol Use: No  . Drug Use: No  . Sexual Activity: Not on file   Other Topics Concern  . Not on file   Social History Narrative  . No narrative on file    Family History  Father: MI   No current facility-administered medications on file prior to encounter.   Current Outpatient Prescriptions on File Prior to Encounter  Medication Sig Dispense Refill  . acetaminophen (TYLENOL) 500 MG tablet Take 1,000 mg by mouth once as needed. For headache/pain      . calcium carbonate (OS-CAL) 600 MG TABS Take 600 mg by mouth daily.        [name] latanoprost (XALATAN) 0.005 % ophthalmic solution Place 1 drop into both eyes at bedtime.      [name] LORazepam (ATIVAN) 1 MG tablet Take 1 mg  by mouth as needed. For sleep      . magnesium oxide (MAG-OX) 400 MG tablet Take 400 mg by mouth 2 (two) times daily.        . metoprolol succinate (TOPROL-XL) 50 MG 24 hr tablet Take 1 tablet (50 mg total) by mouth daily.  90 tablet  3  . nabumetone (RELAFEN) 500 MG tablet Take 1 tablet by mouth 2 (two) times daily.      . pantoprazole (PROTONIX) 40 MG tablet Take 40 mg by mouth 2 (two) times daily.      [name] PARoxetine (PAXIL) 30 MG tablet Take 30 mg by mouth at bedtime.        Allergies  Allergen Reactions  . Codeine     REACTION: Adverse gastrointestinal effects   Physical Examination  Filed Vitals:   12/19/12 1210  BP: 158/97  Pulse: 86  Temp: 98.2 F (36.8 C)  TempSrc: Oral  Resp: 22  Height: 5\' 1"  (1.549 m)  Weight: 168 lb (76.204 kg)  SpO2: 97%   Body mass index is 31.76 kg/(m^2).  General: A&O, frightened, WD, mildly obese   Head: [place]/AT   Ear/Nose/Throat: Hearing grossly intact, nares w/o erythema or drainage, oropharynx w/o Erythema/Exudate, Mallampati score: 3   Eyes: PERRLA, EOMI   Neck: Supple, no nuchal rigidity,  no palpable LAD   Pulmonary: Sym exp, good air movt, CTAB, no rales, rhonchi, & wheezing   Cardiac: RRR with intermittent extra beat, Nl S1, S2, no Murmurs, rubs or gallops,   Vascular:  Vessel  Right  Left   Radial  Palpable  Faintly Palpable   Ulnar  Not Palpable  Not Palpable   Brachial  Palpable  Difficult to Palpate   Carotid  Palpable, without bruit  Palpable, without bruit   Aorta  Not palpable  N/A   Femoral  Palpable  Palpable   Popliteal  Not palpable  Not palpable   PT  Faintly Palpable  Faintly Palpable   DP  Palpable  Palpable    Gastrointestinal: soft, NTND, -G/R, - HSM, - masses, - CVAT B   Musculoskeletal: M/S 5/5 throughout , Extremities without ischemic changes except cool left arm   Neurologic: CN 2-12 intact , Pain and light touch intact in extremities , Motor exam as listed above   Psychiatric: Judgment intact, Mood &  affect appropriate for pt's clinical situation   Dermatologic: See M/S exam for extremity exam, no rashes otherwise noted   Lymph : No Cervical, Axillary, or Inguinal lymphadenopathy   Laboratory: CBC:    Component Value Date/Time   WBC 6.5 12/19/2012 1240   RBC 4.80 12/19/2012 1240   HGB 14.4 12/19/2012 1240   HCT 43.6 12/19/2012 1240   PLT 345 12/19/2012 1240   MCV 90.8 12/19/2012 1240   MCH 30.0 12/19/2012 1240   MCHC 33.0 12/19/2012 1240   RDW 14.5 12/19/2012 1240   LYMPHSABS 2.4 12/19/2012 1240   MONOABS 0.6 12/19/2012 1240   EOSABS 0.2 12/19/2012 1240   BASOSABS 0.1 12/19/2012 1240    BMP:    Component Value Date/Time   NA 141 12/19/2012 1240   K 3.8 12/19/2012 1240   CL 103 12/19/2012 1240   CO2 26 12/19/2012 1240   GLUCOSE 107* 12/19/2012 1240   BUN 10 12/19/2012 1240   CREATININE 0.71 12/19/2012 1240   CREATININE 0.87 02/27/2012 1120   CALCIUM 9.7 12/19/2012 1240   GFRNONAA 85* 12/19/2012 1240   GFRAA >90 12/19/2012 1240    Coagulation: Lab Results  Component Value Date   INR 0.86 12/19/2012   No results found for this basename: PTT   Medical Decision Making  Jacqueline Morgan is a 72 y.o. 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  I recommend: left arm thrombembolectomy.  The risk, benefits, and alternative for a thromboembolectomy were discussed with the patient.  The patient is aware the risks include but are not limited to: bleeding, infection, myocardial infarction, stroke, limb loss, nerve damage, need for additional procedures in the future, and wound complications.  The patient is aware of these risks and agreed to proceed.  Post-operatively, the patient will thromboembolic work-up included: echocardiogram and CTA Chest to evaluate aortic arch for thrombus.  Thank you for allowing [place] to participate in this patient's care.   Leonides Sake, MD  [facility] [place]  Office: [phone]  Pager:  [phone]  12/19/2012, 2:26 PM

## 2012-12-19 NOTE — ED Notes (Signed)
Pt reports waking around 1130 with severe numbness, tingling, and pain to her rt neck and arm.  Pt denies any n/v, diaphoresis, but does reports some dizziness.  Nurse unable to palpate pulse.

## 2012-12-19 NOTE — Preoperative (Signed)
Beta Blockers   Reason not to administer Beta Blockers:Not Applicable  Pt held am Metorprolol

## 2012-12-19 NOTE — Op Note (Signed)
OPERATIVE NOTE   PROCEDURE: 1. Left brachial artery thromboembolectomy 2. Left radial artery thromboembolectomy 3. Left ulnar artery thromboembolectomy  PRE-OPERATIVE DIAGNOSIS: likely brachial artery embolectomy  POST-OPERATIVE DIAGNOSIS: same as above   SURGEON: Leonides Sake, MD  ASSISTANT(S): Della Goo, [facility]   ANESTHESIA: general  ESTIMATED BLOOD LOSS: 100 cc  FINDING(S): 1. Brachial artery embolus at level of brachial artery bifurcation  SPECIMEN(S):  Brachial artery embolus  INDICATIONS:   Jacqueline Morgan is a 72 y.o. female who  presents with acute onset of left arm pain and numbness.  At outside emergency department, she was found to have no pulses.  She was transferred here for evaluation.  By the time of arrival, she had regain some blood flow to the left hand but I felt she had residual thrombus, so I recommended left arm thromboembolectomy.  The risk, benefits, and alternative for thromboembolectomy were discussed with the patient.  The patient is aware the risks include but are not limited to: bleeding, infection, myocardial infarction, stroke, limb loss, nerve damage, need for additional procedures in the future, and wound complications.  The patient is aware of these risks and agreed to proceed.  DESCRIPTION: After obtaining full informed written consent, the patient was brought back to the operating room and placed supine upon the operating table.  The patient received IV antibiotics prior to induction.  After obtaining adequate anesthesia, the patient was prepped and draped in the standard fashion for: left arm thromboembolectomy.  Using the Sonosite, I identified the brachial artery proximally and traced it down to its bifurcation.  Proximally there was some pulse but distally there did not appear to be a pulse.  I made an incision over the distal brachial artery and then dissected out the brachial artery and its bifurcation into the radial and ulnar arteries.  The  brachial veins were entwined with the artery and required branch ligation and clipping.  The distal brachial artery appeared to be 3 mm in diameter.  The proximal radial artery appeared to be somewhat diseased and only 1.5 mm.  The proximal ulnar artery appeared to 2.5 mm.  The patient had already been bolused on Heparin and was on 1000 units/hour Heparin drip.  I clamped the brachial artery proximally and the ulnar and radial artery proximally.  I made a transverse arteriotomy in the distal brachial artery and extended it with a Potts scissor.  Immediately an acute clot was found in the brachial artery.  I teased this clot out.  I then passed a 3 Fogarty proximal and extracted some additional clot and had return of pulsatile bleeding.  I passed the Fogarty two more time and had no further thrombus obtained.  I then removed the clamp off both the radial and ulnar artery and put it on the ulnar artery.  I passed the 3 Fogarty distally in the ulnar artery and had return of vigorous bleeding.  I passed the Fogarty one more time without any thrombus obtained.  I transferred the clamp to the ulnar artery and then passed the Fogarty down the radial for a limited distance, encountering some disease.  No clot was obtained from the radial artery.  I reclamped both radial and ulnar arteries.  The arteriotomy was repaired with a running stitch of 7-0 Prolene.  Thrombin and gelfoam was applied to the arteriotomy.  After waiting a few minutes, no further bleeding was noted.  I reapproximated the subcutaneous tissue with a double layer of 3-0 Vicryl.  The skin was  reapproximated with staples as continued heparin drip use is anticipated with possible bleeding from the surgical site.  A sterile bandage was applied to the incision.  At the end of the case, there was palpable brachial artery with dopplerable radial and ulnar signals.  COMPLICATIONS: none  CONDITION: stable  Leonides Sake, MD [facility]  [place] Office: [phone] Pager: [phone]  12/19/2012, 4:25 PM

## 2012-12-19 NOTE — Anesthesia Preprocedure Evaluation (Addendum)
Anesthesia Evaluation  Patient identified by MRN, date of birth, ID band Patient awake    Reviewed: Allergy & Precautions, H&P , NPO status , Patient's Chart, lab work & pertinent test results, reviewed documented beta blocker date and time   History of Anesthesia Complications Negative for: history of anesthetic complications  Airway Mallampati: II TM Distance: <3 FB Neck ROM: Full  Mouth opening: Limited Mouth Opening  Dental  (+) Dental Advisory Given and Partial Upper   Pulmonary shortness of breath and with exertion,  breath sounds clear to auscultation  Pulmonary exam normal       Cardiovascular hypertension, Pt. on medications Rhythm:Irregular Rate:Normal     Neuro/Psych PSYCHIATRIC DISORDERS Anxiety Depression Fibromyalgia  Neuromuscular disease    GI/Hepatic Neg liver ROS, GERD-  Medicated and Controlled,  Endo/Other  negative endocrine ROS  Renal/GU negative Renal ROS     Musculoskeletal negative musculoskeletal ROS (+) Fibromyalgia -  Abdominal Normal abdominal exam  (+)   Peds  Hematology negative hematology ROS (+)   Anesthesia Other Findings   Reproductive/Obstetrics                         Anesthesia Physical Anesthesia Plan  ASA: III and emergent  Anesthesia Plan: General   Post-op Pain Management:    Induction:   Airway Management Planned: Oral ETT  Additional Equipment:   Intra-op Plan:   Post-operative Plan: Extubation in OR  Informed Consent: I have reviewed the patients History and Physical, chart, labs and discussed the procedure including the risks, benefits and alternatives for the proposed anesthesia with the patient or authorized representative who has indicated his/her understanding and acceptance.   Dental advisory given  Plan Discussed with: CRNA, Anesthesiologist and Surgeon  Anesthesia Plan Comments:       Anesthesia Quick Evaluation

## 2012-12-20 ENCOUNTER — Telehealth: Payer: Self-pay | Admitting: Vascular Surgery

## 2012-12-20 ENCOUNTER — Inpatient Hospital Stay (HOSPITAL_COMMUNITY): Payer: Medicare Other

## 2012-12-20 ENCOUNTER — Encounter (HOSPITAL_COMMUNITY): Payer: Self-pay | Admitting: Vascular Surgery

## 2012-12-20 DIAGNOSIS — I700 Atherosclerosis of aorta: Secondary | ICD-10-CM | POA: Diagnosis not present

## 2012-12-20 DIAGNOSIS — I519 Heart disease, unspecified: Secondary | ICD-10-CM

## 2012-12-20 LAB — CBC
HCT: 36.1 % (ref 36.0–46.0)
Hemoglobin: 12.2 g/dL (ref 12.0–15.0)
MCV: 89.4 fL (ref 78.0–100.0)
RBC: 4.04 MIL/uL (ref 3.87–5.11)
WBC: 9.1 10*3/uL (ref 4.0–10.5)

## 2012-12-20 LAB — HEPARIN LEVEL (UNFRACTIONATED): Heparin Unfractionated: 0.65 IU/mL (ref 0.30–0.70)

## 2012-12-20 LAB — PROTIME-INR: Prothrombin Time: 13.7 seconds (ref 11.6–15.2)

## 2012-12-20 MED ORDER — ENOXAPARIN SODIUM 120 MG/0.8ML ~~LOC~~ SOLN
110.0000 mg | SUBCUTANEOUS | Status: DC
  Administered 2012-12-20 – 2012-12-21 (×2): 110 mg via SUBCUTANEOUS
  Filled 2012-12-20 (×2): qty 0.8

## 2012-12-20 MED ORDER — WARFARIN - PHARMACIST DOSING INPATIENT: Freq: Every day | Status: DC

## 2012-12-20 MED ORDER — WARFARIN SODIUM 5 MG PO TABS
5.0000 mg | ORAL_TABLET | ORAL | Status: AC
  Administered 2012-12-20: 5 mg via ORAL
  Filled 2012-12-20: qty 1

## 2012-12-20 MED ORDER — WARFARIN SODIUM 5 MG PO TABS
5.0000 mg | ORAL_TABLET | Freq: Once | ORAL | Status: AC
  Administered 2012-12-20: 5 mg via ORAL
  Filled 2012-12-20 (×2): qty 1

## 2012-12-20 MED ORDER — COUMADIN BOOK
Freq: Once | Status: AC
  Administered 2012-12-20: 1
  Filled 2012-12-20: qty 1

## 2012-12-20 MED ORDER — WARFARIN VIDEO
Freq: Once | Status: AC
  Administered 2012-12-20: 1

## 2012-12-20 NOTE — Progress Notes (Signed)
Report called to [place], receiving RN on  2 west. VSS. Transferred to 2W18 via wheelchair with personal belongings. Family at bedside.  [facility]

## 2012-12-20 NOTE — Progress Notes (Signed)
Utilization review completed.  

## 2012-12-20 NOTE — Progress Notes (Signed)
ANTICOAGULATION CONSULT NOTE - Follow Up Consult  Pharmacy Consult for heparin Indication: s/p embolectomy  Labs:  Recent Labs  12/19/12 1240 12/19/12 2205 12/20/12 0430  HGB 14.4 12.7 12.2  HCT 43.6 38.0 36.1  PLT 345 328 322  APTT 27  --   --   LABPROT 11.6  --  13.7  INR 0.86  --  1.07  HEPARINUNFRC  --  0.92* 0.65  CREATININE 0.71 0.79  --     Assessment/Plan:  72yo female now therapeutic on heparin after rate decrease for high level.  Will confirm stable with additional level to ensure not accumulating.  Vernard Gambles, PharmD, BCPS  12/20/2012,6:42 AM

## 2012-12-20 NOTE — Progress Notes (Signed)
ANTICOAGULATION CONSULT NOTE - Initial Consult  Pharmacy Consult for Coumadin Indication: s/p embolectomy  Allergies  Allergen Reactions  . Codeine     REACTION: Adverse gastrointestinal effects    Patient Measurements: Height: 5\' 1"  (154.9 cm) Weight: 168 lb (76.204 kg) IBW/kg (Calculated) : 47.8  Vital Signs: Temp: 97.7 F (36.5 C) (09/17 2031) Temp src: Oral (09/17 2031) BP: 131/60 mmHg (09/17 2031) Pulse Rate: 81 (09/17 2031)  Labs:  Recent Labs  12/19/12 1240 12/19/12 2205  HGB 14.4 12.7  HCT 43.6 38.0  PLT 345 328  APTT 27  --   LABPROT 11.6  --   INR 0.86  --   HEPARINUNFRC  --  0.92*  CREATININE 0.71 0.79    Estimated Creatinine Clearance: 60.3 ml/min (by C-G formula based on Cr of 0.79).   Medical History: Past Medical History  Diagnosis Date  . Hyperlipidemia   . Palpitations   . Anxiety   . Fibromyalgia   . GERD (gastroesophageal reflux disease)     Medications:  Prescriptions prior to admission  Medication Sig Dispense Refill  . acetaminophen (TYLENOL) 500 MG tablet Take 1,000 mg by mouth once as needed. For headache/pain      . calcium carbonate (OS-CAL) 600 MG TABS Take 600 mg by mouth daily.        [name] ibuprofen (ADVIL,MOTRIN) 200 MG tablet Take 200 mg by mouth every 6 (six) hours as needed for pain.      [name] latanoprost (XALATAN) 0.005 % ophthalmic solution Place 1 drop into both eyes at bedtime.      [name] LORazepam (ATIVAN) 1 MG tablet Take 1 mg by mouth as needed. For sleep      . magnesium oxide (MAG-OX) 400 MG tablet Take 400 mg by mouth 2 (two) times daily.        . metoprolol succinate (TOPROL-XL) 50 MG 24 hr tablet Take 1 tablet (50 mg total) by mouth daily.  90 tablet  3  . nabumetone (RELAFEN) 500 MG tablet Take 1 tablet by mouth 2 (two) times daily.      . pantoprazole (PROTONIX) 40 MG tablet Take 40 mg by mouth 2 (two) times daily.      [name] PARoxetine (PAXIL) 30 MG tablet Take 30 mg by mouth at bedtime.        Scheduled:  .  cefUROXime (ZINACEF)  IV  1.5 g Intravenous Q12H  . docusate sodium  100 mg Oral Daily  . HYDROmorphone      . latanoprost  1 drop Both Eyes QHS  . metoprolol succinate  50 mg Oral Daily  . oxyCODONE      . oxyCODONE-acetaminophen      . pantoprazole  40 mg Oral BID  . PARoxetine  30 mg Oral QHS  . warfarin  5 mg Oral NOW  . Warfarin - Pharmacist Dosing Inpatient   Does not apply q1800   Infusions:  . sodium chloride 75 mL/hr at 12/19/12 2322  . heparin 850 Units/hr (12/19/12 2322)    Assessment: 71yo female to begin Coumadin s/p left brachial/radial/ulnar artery embolectomies, already on heparin bridge.  Goal of Therapy:  INR 2-3   Plan:  Will give Coumadin 5mg  po x1 now, monitor INR for dose adjustments, and begin Coumadin education.  Vernard Gambles, PharmD, BCPS  12/20/2012,12:09 AM

## 2012-12-20 NOTE — Progress Notes (Addendum)
[facility] [place]  Subjective  - No new complaints.  Left hand feels fine this morning.   Objective 105/51 71 97.9 F (36.6 C) (Oral) 16 97%  Intake/Output Summary (Last 24 hours) at 12/20/12 0734 Last data filed at 12/20/12 0700  Gross per 24 hour  Intake   2313 ml  Output    650 ml  Net   1663 ml    Left hand sensation intact, doppler biphasic signals in radial and ulnar arteries. Dressing clean and dry  Assessment/Planning: Procedure(s): EMBOLECTOMY OF LEFT BRACHIAL ARTERY  1 Day Post-OpSurgeon(s): Fransisco Hertz, MD  Pending 2D echo.  She has a cardiologist in [place] that she has seen in the past with [place].  He has retired and now she wishes to get a new cardiologist.   Heparin has been started and coumadin dosed per pharmacy have been ordered.  Clinton Gallant [facility] 12/20/2012 7:34 AM --  Laboratory Lab Results:  Recent Labs  12/19/12 2205 12/20/12 0430  WBC 9.5 9.1  HGB 12.7 12.2  HCT 38.0 36.1  PLT 328 322   BMET  Recent Labs  12/19/12 1240 12/19/12 2205  NA 141 139  K 3.8 3.9  CL 103 103  CO2 26 26  GLUCOSE 107* 155*  BUN 10 9  CREATININE 0.71 0.79  CALCIUM 9.7 8.3*    COAG Lab Results  Component Value Date   INR 1.07 12/20/2012   INR 0.86 12/19/2012   No results found for this basename: PTT   Addendum  I have independently interviewed and examined the patient, and I agree with the physician assistant's findings.  Warm left hand with palpable radial and weakly palpable ulnar arteries.  Motor and sensation intact.  Left forearm incision bandaged.  Ok to tsfr to floor.  Echo and CTA pending to determine source of embolus.  Heparin drip to Coumadin.  Pt is a Adult nurse patient so will need to set her up for Coumadin follow up with them.    Leonides Sake, MD [facility] [place] Office: [phone] Pager: [phone]  12/20/2012, 9:13 AM  INR will be check in the [facility] clinic Mon.  12/24/2012

## 2012-12-20 NOTE — Telephone Encounter (Addendum)
sched appt 01/04/13 @ 10, ph # listed has no voicemail, pt's mychart is active and has email on file, sent pt a message through 'pt message' option:  Dear Ms. Jacqueline Bryant, MD, has ordered a two week follow up appointment for you at our office, [facility] [address] [facility]. We have scheduled this appointment for Friday, Oct. 3rd at 10:00 a.m. If you have any questions, feel free to contact us at [phone].   Thank you,  Elon Jester  From the office of Leonides Sake, MD                      Message copied by Jena Gauss on Thu Dec 20, 2012  9:35 AM ------      Message from: Melene Plan      Created: Wed Dec 19, 2012  4:38 PM         ----- Message -----         From: Marlowe Shores, PA-C         Sent: 12/19/2012   4:32 PM           To: Melene Plan, RN, [facility]            2 weeks -Chen            Has staples ; left brachial embolectomy ------

## 2012-12-20 NOTE — Progress Notes (Signed)
ANTICOAGULATION CONSULT NOTE - Follow Up Consult  Pharmacy Consult for Lovenox and warfarin Indication: s/p embolectomy  Allergies  Allergen Reactions  . Codeine     REACTION: Adverse gastrointestinal effects    Patient Measurements: Height: 5\' 1"  (154.9 cm) Weight: 168 lb (76.204 kg) IBW/kg (Calculated) : 47.8   Vital Signs: Temp: 98 F (36.7 C) (09/18 1100) Temp src: Oral (09/18 1100) BP: 119/51 mmHg (09/18 1100) Pulse Rate: 86 (09/18 1100)  Labs:  Recent Labs  12/19/12 1240 12/19/12 2205 12/20/12 0430  HGB 14.4 12.7 12.2  HCT 43.6 38.0 36.1  PLT 345 328 322  APTT 27  --   --   LABPROT 11.6  --  13.7  INR 0.86  --  1.07  HEPARINUNFRC  --  0.92* 0.65  CREATININE 0.71 0.79  --     Estimated Creatinine Clearance: 60.3 ml/min (by C-G formula based on Cr of 0.79).   Assessment: 65 YOF who was on IV heparin, now to transition to Lovenox. Heparin gtt was turned off around 1300 per discussion with RN. She was also started on warfarin yesterday. INR today is 1.07. CBC is stable, no bleeding noted.  Goal of Therapy:  INR 2-3 Anti-Xa level 0.6-1.2 units/ml 4hrs after LMWH dose given Monitor platelets by anticoagulation protocol: Yes   Plan:  1. Start Lovenox 1.5mg /kg (110mg ) subq Q24h at 1400 2. Warfarin 5mg  po x1 tonight 3. Daily INR 4. CBC Q72h 5. Follow up for signs/symptoms of bleeding  Ranald Alessio D. Mala Gibbard, PharmD Clinical Pharmacist Pager: [phone] 12/20/2012 1:50 PM

## 2012-12-20 NOTE — Progress Notes (Signed)
  Echocardiogram 2D Echocardiogram has been performed.  Jacqueline Morgan, Jacqueline Morgan 12/20/2012, 9:53 AM

## 2012-12-21 MED ORDER — OXYCODONE-ACETAMINOPHEN 5-325 MG PO TABS: 1.0000 | ORAL_TABLET | ORAL | Status: DC | PRN

## 2012-12-21 MED ORDER — ENOXAPARIN SODIUM 120 MG/0.8ML ~~LOC~~ SOLN: 110.0000 mg | SUBCUTANEOUS | Status: DC

## 2012-12-21 MED ORDER — WARFARIN SODIUM 5 MG PO TABS: 5.0000 mg | ORAL_TABLET | Freq: Every day | ORAL | Status: DC

## 2012-12-21 NOTE — Progress Notes (Addendum)
12/21/2012 2:52 PM Nursing note Discharge avs form, medications already taken today and those due this evening given and explained to patient and daughter. Follow up appointments, incision site care and when to call MD reviewed. Activity restrictions reviewed. Questions and concerns addressed. Special instructions for Coumadin and Lovenox as well as INR check  Reviewed with patient again. Iv sites already d/c. D/c tele. Pt. States she has a cane at home that she can use if needed.  D/c home with daughter per orders. Pt. Had already viewed coumadin video and received coumadin educational booklet. Pt. Also provided with printed information about Coumadin, Lovenox and Vitamin K foods and Coumadin. Questions addressed.  Janaia Kozel, Blanchard Kelch

## 2012-12-21 NOTE — Progress Notes (Signed)
12/21/2012 8:31 AM Nursing note Pt. And family viewed Lovenox educational video #108. Questions and concerns addressed.  Dwon Sky, Blanchard Kelch

## 2012-12-21 NOTE — Care Management Note (Signed)
    Page 1 of 1   12/21/2012     2:58:00 PM   CARE MANAGEMENT NOTE 12/21/2012  Patient:  Jacqueline Morgan, Jacqueline Morgan   Account Number:  [id number]  Date Initiated:  12/20/2012  Documentation initiated by:  Donn Pierini  Subjective/Objective Assessment:   Pt admitted s/p left arm thromboembolectomy     Action/Plan:   PTA pt lived at home alone, NCM to follow for d/c needs   Anticipated DC Date:  12/22/2012   Anticipated DC Plan:  HOME/SELF CARE      DC Planning Services  CM consult  Medication Assistance      Choice offered to / List presented to:             Status of service:  Completed, signed off Medicare Important Message given?   (If response is "NO", the following Medicare IM given date fields will be blank) Date Medicare IM given:   Date Additional Medicare IM given:    Discharge Disposition:  HOME/SELF CARE  Per UR Regulation:  Reviewed for med. necessity/level of care/duration of stay  If discussed at Long Length of Stay Meetings, dates discussed:    Comments:  12/21/12 [name],RN,BSN [phone] PT WILL NEED 5 SYRINGES OF 110MG  ENOXAPARIN AT DC; WILL CHECK COPAY AMTS.  this is a tier 2 medication, no auth needed, patient will need to use in-network pharmacy ([facility], [facility], [facility], or [facility])  co-pay is: $6 for 30 day, $12 for 60 day, or $18 for 90 day   NOTIFIED PT OF $6 COPAY FOR ENOXAPARIN.  SHE IS PLEASED WITH THIS INFORMATION.

## 2012-12-21 NOTE — Progress Notes (Addendum)
12/21/2012 1345 Nursing note Step by step instructions for correct Lovenox administration reviewed with patient and daughter. Pt. Daughter French Ana successfully administered today's dose of Lovenox per orders. Questions and concerns addressed. Confirmed with Lianne Cure PAC that pt. Has appointment at [facility] in [place] for Monday Sept. 22.  This information shared with patient and family.  Stellarose Cerny, Blanchard Kelch

## 2012-12-21 NOTE — Progress Notes (Signed)
ANTICOAGULATION CONSULT NOTE - Follow Up Consult  Pharmacy Consult:  Lovenox / Coumadin (VTE Overlap D#2/5) Indication:  Left arm thrombosis  Allergies  Allergen Reactions  . Codeine     REACTION: Adverse gastrointestinal effects    Patient Measurements: Height: 5\' 1"  (154.9 cm) Weight: 168 lb (76.204 kg) IBW/kg (Calculated) : 47.8  Vital Signs: Temp: 98.3 F (36.8 C) (09/19 0510) Temp src: Oral (09/19 0510) BP: 133/64 mmHg (09/19 1018) Pulse Rate: 65 (09/19 1018)  Labs:  Recent Labs  12/19/12 1240 12/19/12 2205 12/20/12 0430  HGB 14.4 12.7 12.2  HCT 43.6 38.0 36.1  PLT 345 328 322  APTT 27  --   --   LABPROT 11.6  --  13.7  INR 0.86  --  1.07  HEPARINUNFRC  --  0.92* 0.65  CREATININE 0.71 0.79  --     Estimated Creatinine Clearance: 60.3 ml/min (by C-G formula based on Cr of 0.79).      Assessment: 11 YOF continues on Lovenox and Coumadin for left arm thrombosis, s/p embolectomy.  INR remains sub-therapeutic as expected.  Her renal function remains stable.  No bleeding reported.   Goal of Therapy:  Anti-Xa level 0.6-1.2 units/ml 4hrs after LMWH dose given INR 2 - 3 Monitor platelets by anticoagulation protocol: Yes    Plan:  - Lovenox 110mg  Q24H (1.5 mg/kg/day) - Coumadin 5mg  PO today - Daily PT / INR, CBC Q72H while on Lovenox - Noted plan to discharge patient home on Lovenox 110mg  SQ Q24H and Coumadin 5mg  PO daily at 1800.  Consider obtaining INR on Monday 12/24/12.  Patient will need at least 3 more doses of Lovenox to meet VTE core measure, if discharge post Lovenox dose given today.    Kaitlynne Wenz D. Laney Potash, PharmD, BCPS Pager:  [phone] 12/21/2012, 11:05 AM

## 2012-12-21 NOTE — Progress Notes (Addendum)
[facility]  Daily Progress Note  Assessment/Planning: POD #2 s/p L brachial, radial and ulnar embolectomy for brachial artery embolism   Echo: negative for thrombus  CT chest: +arch atherosclerosis, without CTA definite diagnosis of arch thrombus impossible; CTA canceled as poor vascular access for CTA  Brachial embolism: anticoagulation with lovenox bridge to Coumadin, follow up with Coumadin clinic, minimum of 3 months of coumadin  Patient will likely need some instruction on Lovenox administration  [facility] by report setup  Cardiac arrhythmia: no evidence on hospital tracing and EKG, will refer pt as outpt back to her Cardiology clinic.  Holter monitor may be needed.  Ok to D/C if home arrangements setup  Follow up in two weeks for staple removal  Subjective  - 2 Days Post-Op  No complaints  Objective Filed Vitals:   12/20/12 1100 12/20/12 1416 12/20/12 2105 12/21/12 0510  BP: 119/51 136/64 114/69 119/62  Pulse: 86 86 72 72  Temp: 98 F (36.7 C)  98.3 F (36.8 C) 98.3 F (36.8 C)  TempSrc: Oral  Oral Oral  Resp: 17 23 20 20   Height:      Weight:      SpO2: 95%  98% 94%    Intake/Output Summary (Last 24 hours) at 12/21/12 0719 Last data filed at 12/20/12 1600  Gross per 24 hour  Intake    124 ml  Output    525 ml  Net   -401 ml    PULM  CTAB CV  RRR GI  soft, NTND VASC  Palpable ulnar > radial pulse, inc c/d/i, bandage changed  Laboratory CBC    Component Value Date/Time   WBC 9.1 12/20/2012 0430   HGB 12.2 12/20/2012 0430   HCT 36.1 12/20/2012 0430   PLT 322 12/20/2012 0430    BMET    Component Value Date/Time   NA 139 12/19/2012 2205   K 3.9 12/19/2012 2205   CL 103 12/19/2012 2205   CO2 26 12/19/2012 2205   GLUCOSE 155* 12/19/2012 2205   BUN 9 12/19/2012 2205   CREATININE 0.79 12/19/2012 2205   CREATININE 0.87 02/27/2012 1120   CALCIUM 8.3* 12/19/2012 2205   GFRNONAA 82* 12/19/2012 2205   GFRAA >90  12/19/2012 2205    Leonides Sake, MD [facility] [place] Office: [phone] Pager: [phone]  12/21/2012, 7:19 AM

## 2012-12-21 NOTE — Discharge Summary (Signed)
[facility] Discharge Summary   Patient ID:  Jacqueline Morgan MRN: [id number] DOB/AGE: 09-Jul-1940 72 y.o.  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 Hyperlipidemia   . Palpitations   . Anxiety   . Fibromyalgia   . GERD (gastroesophageal reflux disease)     Procedure(s): EMBOLECTOMY OF LEFT BRACHIAL ARTERY  Discharged Condition: good  HPI: Jacqueline Morgan is a 72 y.o. (12/23/1938) female who presents with chief complaint: left arm pain. The patient had onset of severe (10/10) left arm pain with numbness ~1130. She was seen at the [facility] ED and found to have a cold left arm without any dopplerable signals. ~1 PM she regained some signals and had improvement in her pain. She was transferred to [facility] for evaluation for possible thromboembolectomy. The patient was distraught and unable provide much history. Family is not aware of her medical history. Some of history is obtained from prior medical records. She has had some palpitations recently by report.  She underwent surgery by Dr. Leonides Sake on 12/19/2012: Left brachial artery thromboembolectomy, Left radial artery thromboembolectomy, Left ulnar artery thromboembolectomy. She was placed on heparin dripp and coumadin was started.  A 2D echo was performed negative for thrombus.  CT chest: +arch atherosclerosis, without CTA definite diagnosis of arch thrombus impossible; CTA canceled as poor vascular access for CTA.  There was no evidence of cardiac arrhythmia on EKG.  She will be sent home today on Lovenox 110 mg SQ Q 24 to bridge until her coumadin is therapeutic.  She will be on coumadin for at least 3 months.She will f/u with [place] [facility]  12/24/2012, and her cardiologist.     Hospital Course:  Jacqueline Morgan is a 72 y.o. female is S/P Left Procedure(s): EMBOLECTOMY OF LEFT BRACHIAL ARTERY Extubated: POD # 0 Physical exam: PULM CTAB  CV RRR  GI soft, NTND Post-op wounds healing well Pt. Ambulating, voiding and taking PO diet without difficulty. Pt pain controlled with PO pain meds. Labs as below Complications: see hospital course  Consults: None    Significant Diagnostic Studies: CBC Lab Results  Component Value Date   WBC 9.1 12/20/2012   HGB 12.2 12/20/2012   HCT 36.1 12/20/2012   MCV 89.4 12/20/2012   PLT 322 12/20/2012    BMET    Component Value Date/Time   NA 139 12/19/2012 2205   K 3.9 12/19/2012 2205   CL 103 12/19/2012 2205   CO2 26 12/19/2012 2205   GLUCOSE 155* 12/19/2012 2205   BUN 9 12/19/2012 2205   CREATININE 0.79 12/19/2012 2205   CREATININE 0.87 02/27/2012 1120   CALCIUM 8.3* 12/19/2012 2205   GFRNONAA 82* 12/19/2012 2205   GFRAA >90 12/19/2012 2205   COAG Lab Results  Component Value Date   INR 1.07 12/20/2012   INR 0.86 12/19/2012     Disposition:  Discharge to :Home Discharge Orders   Future Appointments Provider Department Dept Phone   01/04/2013 10:00 AM Fransisco Hertz, MD [facility] [phone]   Future Orders Complete By Expires   Call MD for:  redness, tenderness, or signs of infection (pain, swelling, bleeding, redness, odor or green/yellow discharge around incision site)  As directed    Call MD for:  severe or increased pain, loss or decreased feeling  in affected limb(s)  As directed    Call MD for:  temperature >100.5  As directed    Driving Restrictions  As directed    Comments:     No driving for 2 weeks   Increase activity slowly  As directed    Comments:     Walk with assistance use walker or cane as needed   Lifting restrictions  As directed    Comments:     No lifting for 6 weeks   May shower   As directed    Resume previous diet   As directed        Medication List         acetaminophen 500 MG tablet  Commonly known as:  TYLENOL  Take 1,000 mg by mouth once as needed. For headache/pain     calcium carbonate 600 MG Tabs tablet  Commonly known as:  OS-CAL  Take 600 mg by mouth daily.     enoxaparin 120 MG/0.8ML injection  Commonly known as:  LOVENOX  Inject 0.73 mLs (110 mg total) into the skin daily.     ibuprofen 200 MG tablet  Commonly known as:  ADVIL,MOTRIN  Take 200 mg by mouth every 6 (six) hours as needed for pain.     latanoprost 0.005 % ophthalmic solution  Commonly known as:  XALATAN  Place 1 drop into both eyes at bedtime.     LORazepam 1 MG tablet  Commonly known as:  ATIVAN  Take 1 mg by mouth as needed. For sleep     magnesium oxide 400 MG tablet  Commonly known as:  MAG-OX  Take 400 mg by mouth 2 (two) times daily.     metoprolol succinate 50 MG 24 hr tablet  Commonly known as:  TOPROL-XL  Take 1 tablet (50 mg total) by mouth daily.     nabumetone 500 MG tablet  Commonly known as:  RELAFEN  Take 1 tablet by mouth 2 (two) times daily.     oxyCODONE-acetaminophen 5-325 MG per tablet  Commonly known as:  PERCOCET/ROXICET  Take 1-2 tablets by mouth every 4 (four) hours as needed for pain.     pantoprazole 40 MG tablet  Commonly known as:  PROTONIX  Take 40 mg by mouth 2 (two) times daily.     PARoxetine 30 MG tablet  Commonly known as:  PAXIL  Take 30 mg by mouth at bedtime.     warfarin 5 MG tablet  Commonly known as:  COUMADIN  Take 1 tablet (5 mg total) by mouth daily.       Verbal and written Discharge instructions given to the patient. Wound care per Discharge AVS     Follow-up Information   Follow up with Nilda Simmer, MD In 2 weeks. (office will arrange-sent)    Specialty:  Vascular Surgery   Contact information:   [address] [place] [address] [phone]       Signed: Clinton Gallant [facility] 12/21/2012, 7:37 AM  Addendum  I have  independently interviewed and examined the patient, and I agree with the physician assistant's discharge summary.  This patient presented with acute embolism of L arm.  Intraoperative findings are consistent with a distal brachial artery embolus.  Since embolectomy, she has undergone anticoagulation.  Embolic work-up demonstrates no intracardiac thrombus with possible aortic arch atherosclerosis.  Full evaluation of the aortic arch was not possible due to inability to obtain upper extremity IV  access.  She is being discharged on a Lovenox bridge to coumadin.  She will follow up with [facility] and a cardiology evaluation for possible cardiac arrhythmia will be arranged.  Leonides Sake, MD [facility] [place] Office: [phone] Pager: [phone]  12/21/2012, 2:11 PM

## 2012-12-24 ENCOUNTER — Telehealth: Payer: Self-pay | Admitting: Vascular Surgery

## 2012-12-24 ENCOUNTER — Ambulatory Visit (INDEPENDENT_AMBULATORY_CARE_PROVIDER_SITE_OTHER): Payer: Medicare Other | Admitting: *Deleted

## 2012-12-24 DIAGNOSIS — I82622 Acute embolism and thrombosis of deep veins of left upper extremity: Secondary | ICD-10-CM

## 2012-12-24 DIAGNOSIS — Z7901 Long term (current) use of anticoagulants: Secondary | ICD-10-CM

## 2012-12-24 DIAGNOSIS — I82629 Acute embolism and thrombosis of deep veins of unspecified upper extremity: Secondary | ICD-10-CM | POA: Diagnosis not present

## 2012-12-24 LAB — POCT INR: INR: 3.3

## 2012-12-24 NOTE — Telephone Encounter (Signed)
Message copied by Jena Gauss on Mon Dec 24, 2012 10:22 AM ------      Message from: Warrick Parisian A      Created: Fri Dec 21, 2012  3:32 PM                   ----- Message -----         From: Melene Plan, RN         Sent: 12/21/2012   9:19 AM           To: Sara Chu, [facility]                        ----- Message -----         From: Lars Mage, PA-C         Sent: 12/21/2012   7:27 AM           To: Melene Plan, RN            F/U in 2 weeks with Dr. Imogene Burn left brachial artery embolectomy.  She will f/u in the [facility] on Mon. 12/24/2012 and could you make sure she follows up with her cardiologist with [facility] in [place].  We have talked about this with her and her daughter.  We do not know the source of her embolectomy.  She had a 2D echo and CT of the chest. ------

## 2012-12-26 ENCOUNTER — Encounter: Payer: Self-pay | Admitting: Family Medicine

## 2012-12-26 ENCOUNTER — Ambulatory Visit (INDEPENDENT_AMBULATORY_CARE_PROVIDER_SITE_OTHER): Payer: Medicare Other | Admitting: Family Medicine

## 2012-12-26 VITALS — BP 112/74 | Temp 98.3°F | Ht 60.0 in | Wt 169.0 lb

## 2012-12-26 DIAGNOSIS — R51 Headache: Secondary | ICD-10-CM

## 2012-12-26 DIAGNOSIS — R0989 Other specified symptoms and signs involving the circulatory and respiratory systems: Secondary | ICD-10-CM | POA: Diagnosis not present

## 2012-12-26 NOTE — Progress Notes (Signed)
  Subjective:    Patient ID: Jacqueline Morgan, female    DOB: Apr 02, 1941, 72 y.o.   MRN: [id number]  Otalgia  There is pain in the right ear. The current episode started in the past 7 days. Associated symptoms include neck pain. Associated symptoms comments: Jaw pain. She has tried acetaminophen (percocet) for the symptoms. The treatment provided moderate relief.   This patient had a thrombus in the left arm she had emergency surgery approximately a week and a half ago she is on Coumadin. It is unknown why she had a blood clot. She is now experiencing sharp pains that radiate from the right clavicle up the side of her neck into her head she describes some temporal soreness in your pain she also describes intermittent sharp pains that radiate upward as stated above significant time was spent with the patient discussing her symptoms she denies blurred vision nausea vomiting she denies double vision she denies severe pain She has a history hypertension DVT hyperlipidemia. She used to take Lipitor but she stopped taking it on her own. She is scheduled see cardiologist near future. Family history noncontributory. Social does not smoke lives by herself her husband unfortunately died of cancer or recently   Review of Systems  HENT: Positive for ear pain and neck pain.    denies shortness of breath denies vomiting double vision denies unilateral numbness or weakness no chest tightness pressure pain     Objective:   Physical Exam Eardrums normal throat is normal neck no masses or felt faint bruit noted on the right side no tenderness in the neck there is some slight tenderness in the right temporal region her lungs are clear hearts regular pulse normal both arms are warm.       Assessment & Plan:  Significant right side neck pain along with temporal pain I would recommend a sedimentation rate also recommend ultrasound of the neck to rule out the possibility of carotid disease I doubt that this  patient has carotid dissection going on because the pain is intermittent I do believe this patient should follow through with seeing cardiology she ought to be on Coumadin for at least 6 months I also told the patient that when she comes off the Coumadin unless there is a specific reason why she had the clot then she would need evaluation by hematology to look for any type of hypercoagulability. 25 minutes spent in patient

## 2012-12-27 ENCOUNTER — Ambulatory Visit (INDEPENDENT_AMBULATORY_CARE_PROVIDER_SITE_OTHER): Payer: Medicare Other | Admitting: *Deleted

## 2012-12-27 DIAGNOSIS — I82629 Acute embolism and thrombosis of deep veins of unspecified upper extremity: Secondary | ICD-10-CM | POA: Diagnosis not present

## 2012-12-27 DIAGNOSIS — Z7901 Long term (current) use of anticoagulants: Secondary | ICD-10-CM

## 2012-12-27 DIAGNOSIS — I82622 Acute embolism and thrombosis of deep veins of left upper extremity: Secondary | ICD-10-CM

## 2012-12-31 ENCOUNTER — Encounter: Payer: Self-pay | Admitting: Adult Health

## 2012-12-31 ENCOUNTER — Encounter: Payer: Self-pay | Admitting: *Deleted

## 2012-12-31 ENCOUNTER — Ambulatory Visit (HOSPITAL_COMMUNITY)
Admission: RE | Admit: 2012-12-31 | Discharge: 2012-12-31 | Disposition: A | Discharge status: Home / Self Care | Payer: Medicare Other | Source: Ambulatory Visit | Attending: Family Medicine | Admitting: Family Medicine

## 2012-12-31 ENCOUNTER — Ambulatory Visit (INDEPENDENT_AMBULATORY_CARE_PROVIDER_SITE_OTHER): Payer: Medicare Other | Admitting: Adult Health

## 2012-12-31 VITALS — BP 127/78 | HR 76 | Ht 61.0 in | Wt 168.0 lb

## 2012-12-31 DIAGNOSIS — I739 Peripheral vascular disease, unspecified: Secondary | ICD-10-CM

## 2012-12-31 DIAGNOSIS — I10 Essential (primary) hypertension: Secondary | ICD-10-CM

## 2012-12-31 DIAGNOSIS — I6529 Occlusion and stenosis of unspecified carotid artery: Secondary | ICD-10-CM | POA: Insufficient documentation

## 2012-12-31 DIAGNOSIS — E785 Hyperlipidemia, unspecified: Secondary | ICD-10-CM | POA: Diagnosis not present

## 2012-12-31 DIAGNOSIS — I658 Occlusion and stenosis of other precerebral arteries: Secondary | ICD-10-CM | POA: Insufficient documentation

## 2012-12-31 NOTE — Assessment & Plan Note (Signed)
Watch closely for now., No plans for invasive testing at this time. She is asymptomatic.

## 2012-12-31 NOTE — Assessment & Plan Note (Signed)
Blood pressure is well controlled at this time. No changes at this time.

## 2012-12-31 NOTE — Assessment & Plan Note (Signed)
Continue OP labs per PCP

## 2012-12-31 NOTE — Progress Notes (Deleted)
Name: Jacqueline Morgan    DOB: 1941-01-20  Age: 72 y.o.  MR#: [id number]       PCP:  Lilyan Punt, MD      Insurance: Payor: MEDICARE / Plan: MEDICARE PART A AND B / Product Type: *No Product type* /   CC:    Chief Complaint  Patient presents with  . PAD  . Hyperlipidemia    VS Filed Vitals:   12/31/12 1455  BP: 127/78  Pulse: 76  Height: 5\' 1"  (1.549 m)  Weight: 168 lb (76.204 kg)    Weights Current Weight  12/31/12 168 lb (76.204 kg)  12/26/12 169 lb (76.658 kg)  12/19/12 168 lb (76.204 kg)    Blood Pressure  BP Readings from Last 3 Encounters:  12/31/12 127/78  12/26/12 112/74  12/21/12 133/64     Admit date:  (Not on file) Last encounter with RMR:  Visit date not found   Allergy Codeine  Current Outpatient Prescriptions  Medication Sig Dispense Refill  . acetaminophen (TYLENOL) 500 MG tablet Take 1,000 mg by mouth once as needed. For headache/pain      . calcium carbonate (OS-CAL) 600 MG TABS Take 600 mg by mouth daily.        [name] latanoprost (XALATAN) 0.005 % ophthalmic solution Place 1 drop into both eyes at bedtime.      [name] LORazepam (ATIVAN) 1 MG tablet Take 1 mg by mouth as needed. For sleep      . magnesium oxide (MAG-OX) 400 MG tablet Take 400 mg by mouth 2 (two) times daily.        . metoprolol succinate (TOPROL-XL) 50 MG 24 hr tablet Take 1 tablet (50 mg total) by mouth daily.  90 tablet  3  . nabumetone (RELAFEN) 500 MG tablet Take 1 tablet by mouth 2 (two) times daily.      [name] oxyCODONE-acetaminophen (PERCOCET/ROXICET) 5-325 MG per tablet Take 1-2 tablets by mouth every 4 (four) hours as needed for pain.  30 tablet  0  . pantoprazole (PROTONIX) 40 MG tablet Take 40 mg by mouth 2 (two) times daily.      [name] PARoxetine (PAXIL) 30 MG tablet Take 30 mg by mouth at bedtime.       [name] warfarin (COUMADIN) 5 MG tablet Take 1 tablet (5 mg total) by mouth daily.  40 tablet  0   No current facility-administered medications for this visit.    Discontinued Meds:    There are no discontinued medications.  Patient Active Problem List   Diagnosis Date Noted  . DVT of upper extremity (deep vein thrombosis) 12/24/2012  . Long term (current) use of anticoagulants 12/24/2012  . GASTROESOPHAGEAL REFLUX DISEASE 05/19/2009  . FIBROMYALGIA 05/19/2009  . SYNCOPE 05/19/2009  . HYPERGLYCEMIA, FASTING 05/19/2009  . HYPERLIPIDEMIA 05/07/2009  . ANXIETY 05/07/2009  . DEPRESSION 05/07/2009  . HYPERTENSION 05/07/2009    LABS    Component Value Date/Time   NA 139 12/19/2012 2205   NA 141 12/19/2012 1240   NA 136 12/09/2012 0915   K 3.9 12/19/2012 2205   K 3.8 12/19/2012 1240   K 3.7 12/09/2012 0915   CL 103 12/19/2012 2205   CL 103 12/19/2012 1240   CL 102 12/09/2012 0915   CO2 26 12/19/2012 2205   CO2 26 12/19/2012 1240   CO2 25 12/09/2012 0915   GLUCOSE 155* 12/19/2012 2205   GLUCOSE 107* 12/19/2012 1240   GLUCOSE 105* 12/09/2012 0915   BUN 9 12/19/2012 2205   BUN  10 12/19/2012 1240   BUN 10 12/09/2012 0915   CREATININE 0.79 12/19/2012 2205   CREATININE 0.71 12/19/2012 1240   CREATININE 0.71 12/09/2012 0915   CREATININE 0.87 02/27/2012 1120   CALCIUM 8.3* 12/19/2012 2205   CALCIUM 9.7 12/19/2012 1240   CALCIUM 9.1 12/09/2012 0915   GFRNONAA 82* 12/19/2012 2205   GFRNONAA 85* 12/19/2012 1240   GFRNONAA 85* 12/09/2012 0915   GFRAA >90 12/19/2012 2205   GFRAA >90 12/19/2012 1240   GFRAA >90 12/09/2012 0915   CMP     Component Value Date/Time   NA 139 12/19/2012 2205   K 3.9 12/19/2012 2205   CL 103 12/19/2012 2205   CO2 26 12/19/2012 2205   GLUCOSE 155* 12/19/2012 2205   BUN 9 12/19/2012 2205   CREATININE 0.79 12/19/2012 2205   CREATININE 0.87 02/27/2012 1120   CALCIUM 8.3* 12/19/2012 2205   PROT 8.2 12/19/2012 1240   ALBUMIN 3.6 12/19/2012 1240   AST 38* 12/19/2012 1240   ALT 32 12/19/2012 1240   ALKPHOS 119* 12/19/2012 1240   BILITOT 0.4 12/19/2012 1240   GFRNONAA 82* 12/19/2012 2205   GFRAA >90 12/19/2012 2205       Component Value Date/Time   WBC 9.1 12/20/2012 0430   WBC  9.5 12/19/2012 2205   WBC 6.5 12/19/2012 1240   HGB 12.2 12/20/2012 0430   HGB 12.7 12/19/2012 2205   HGB 14.4 12/19/2012 1240   HCT 36.1 12/20/2012 0430   HCT 38.0 12/19/2012 2205   HCT 43.6 12/19/2012 1240   MCV 89.4 12/20/2012 0430   MCV 89.8 12/19/2012 2205   MCV 90.8 12/19/2012 1240    Lipid Panel     Component Value Date/Time   CHOL 358* 02/27/2012 1120   TRIG 296* 02/27/2012 1120   HDL 39* 02/27/2012 1120   CHOLHDL 9.2 02/27/2012 1120   VLDL 59* 02/27/2012 1120   LDLCALC 260* 02/27/2012 1120    ABG No results found for this basename: phart, pco2, pco2art, po2, po2art, hco3, tco2, acidbasedef, o2sat     Lab Results  Component Value Date   TSH 0.950 02/27/2012   BNP (last 3 results) No results found for this basename: PROBNP,  in the last 8760 hours Cardiac Panel (last 3 results) No results found for this basename: CKTOTAL, CKMB, TROPONINI, RELINDX,  in the last 72 hours  Iron/TIBC/Ferritin No results found for this basename: iron, tibc, ferritin     EKG Orders placed in visit on 12/31/12  . EKG 12-LEAD     Prior Assessment and Plan Problem List as of 12/31/2012     Cardiovascular and Mediastinum   HYPERTENSION   Last Assessment & Plan   02/24/2012 Office Visit Written 02/24/2012  2:09 PM by Jodelle Gross, NP     Blood pressure is elevated on this visit at 152/74 in triaged. I have repeated her blood pressure evaluation manually in the clinic area and found it to be 138/68. We will give her refills on metoprolol 50 mg daily. She is also to have labs a. CMET, CBC, TSH,and Hgb A1C with copies to Dr.Scott Luking.    SYNCOPE   DVT of upper extremity (deep vein thrombosis)     Digestive   GASTROESOPHAGEAL REFLUX DISEASE     Musculoskeletal and Integument   FIBROMYALGIA     Other   HYPERLIPIDEMIA   Last Assessment & Plan   02/24/2012 Office Visit Written 02/24/2012  2:09 PM by Jodelle Gross, NP     She  has not been seen by her primary care physician for  over a year. We will have fasting lipids completed with results sent to her PCP.    ANXIETY   DEPRESSION   HYPERGLYCEMIA, FASTING   Long term (current) use of anticoagulants       Imaging: Dg Chest 2 View  12/09/2012   *RADIOLOGY REPORT*  Clinical Data: Right-sided chest pain, shortness of breath  CHEST - 2 VIEW  Comparison: None.  Findings: Examination is degraded due to patient body habitus.  Borderline enlarged cardiac silhouette and mediastinal contours. Apparent veiling opacities overlying the bilateral lower lungs are favored to be artifactual due to overlying breast tissue.  No focal airspace opacity.  No pleural effusion or pneumothorax.  No evidence of edema.  No acute osseous abnormalities.  IMPRESSION: No acute cardiopulmonary disease.   Original Report Authenticated By: Tacey Ruiz, MD   Ct Chest Wo Contrast  12/20/2012   CLINICAL DATA:  Looking for calcification of aortic arch  EXAM: CT CHEST WITHOUT CONTRAST  TECHNIQUE: Multidetector CT imaging of the chest was performed following the standard protocol without IV contrast.  COMPARISON:  None.  FINDINGS: THORACIC INLET/BODY WALL:  9 mm low attenuation nodule in the left lobe thyroid gland. There is a coarse calcification in the anterior right thyroid. No neighboring adenopathy or infiltrative margin.  MEDIASTINUM:  No cardiomegaly. Mild posterior pericardial thickening or fluid accumulation. Coronary artery atherosclerosis, most notably the left main ostium. There is minimal atherosclerotic calcification along the lower wall distal arch. There is calcified plaque also at the origin of the left vertebral artery, which arises directly from the arch. No lymphadenopathy.  LUNG WINDOWS:  Patchy ground-glass density of the dependent lungs bilaterally, with slight volume loss, favor atelectasis. Small-vessel changes could give a similar appearance, especially given history pulmonary embolism. There is a dense opacity in the peripheral lateral  segment right middle lobe, relatively small. No indication of recent pulmonary embolism to suggest that this represents infarct.  UPPER ABDOMEN:  No acute findings.  OSSEOUS:  No acute fracture.  No suspicious lytic or blastic lesions.  IMPRESSION: 1. Mild atherosclerotic calcification of the aorta for age, within most notable calcification along the inferior wall of the distal arch. 2. Calcified plaque involving the left vertebral artery ostium, which arises directly from the aorta. 3. Small peripheral opacity in the right middle lobe, presumably infectious or inflammatory.   Electronically Signed   By: Tiburcio Pea   On: 12/20/2012 23:35   US Carotid Duplex Bilateral  12/31/2012   *RADIOLOGY REPORT*  Clinical Data: Right carotid bruit  BILATERAL CAROTID DUPLEX ULTRASOUND  Technique: Wallace Cullens scale imaging, color Doppler and duplex ultrasound were performed of bilateral carotid and vertebral arteries in the neck.  Comparison:  None.  Criteria:  Quantification of carotid stenosis is based on velocity parameters that correlate the residual internal carotid diameter with NASCET-based stenosis levels, using the diameter of the distal internal carotid lumen as the denominator for stenosis measurement.  The following velocity measurements were obtained:                   PEAK SYSTOLIC/END DIASTOLIC RIGHT ICA:                        104/35cm/sec CCA:                        84/19cm/sec SYSTOLIC ICA/CCA RATIO:  1.24 DIASTOLIC ICA/CCA RATIO:    1.81 ECA:                        78cm/sec  LEFT ICA:                        71/24cm/sec CCA:                        84/19cm/sec SYSTOLIC ICA/CCA RATIO:     0.84 DIASTOLIC ICA/CCA RATIO:    1.23 ECA:                        52cm/sec  Findings:  RIGHT CAROTID ARTERY: Slight tortuosity with scattered atherosclerotic changes.  No hemodynamically significant right ICA stenosis, velocity elevation, or turbulent flow.  RIGHT VERTEBRAL ARTERY:  Antegrade  LEFT CAROTID ARTERY: Similar mild  tortuosity and atherosclerotic changes.  No hemodynamically significant left ICA stenosis, velocity elevation, or turbulent flow.  LEFT VERTEBRAL ARTERY:  Antegrade  IMPRESSION: Mild bilateral carotid atherosclerosis but no hemodynamically significant ICA stenosis on either side.  Degree of narrowing less than 50% bilaterally.   Original Report Authenticated By: Judie Petit. Miles Costain, M.D.   Dg Chest Port 1 View  12/19/2012   CLINICAL DATA:  Left arm and neck pain.  EXAM: PORTABLE CHEST - 1 VIEW  COMPARISON:  12/09/2012  FINDINGS: Slight elevation of the right hemidiaphragm. Heart is borderline enlarged. Mild peribronchial thickening. No confluent opacities or visible effusion. No acute bony abnormality.  IMPRESSION: Mild bronchitic changes.   Electronically Signed   By: Charlett Nose M.D.   On: 12/19/2012 13:23

## 2012-12-31 NOTE — Progress Notes (Signed)
HPI: Jacqueline Morgan is a 72 year old former patient of Dr. Molly Maduro are will now be est. with Dr. 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 This was completed by Dr. Leonides Sake. The patient was started on Coumadin, and should be on this for a minimum of 3 months. She will be followed by our Coumadin clinic in [place].   She is without complaints today except for some soreness in the left brachial area. She is to follow up with [facility] in 3 days.   Allergies  Allergen Reactions  . Codeine     REACTION: Adverse gastrointestinal effects    Current Outpatient Prescriptions  Medication Sig Dispense Refill  . acetaminophen (TYLENOL) 500 MG tablet Take 1,000 mg by mouth once as needed. For headache/pain      . calcium carbonate (OS-CAL) 600 MG TABS Take 600 mg by mouth daily.        [name] latanoprost (XALATAN) 0.005 % ophthalmic solution Place 1 drop into both eyes at bedtime.      [name] LORazepam (ATIVAN) 1 MG tablet Take 1 mg by mouth as needed. For sleep      . magnesium oxide (MAG-OX) 400 MG tablet Take 400 mg by mouth 2 (two) times daily.        . metoprolol succinate (TOPROL-XL) 50 MG 24 hr tablet Take 1 tablet (50 mg total) by mouth daily.  90 tablet  3  . nabumetone (RELAFEN) 500 MG tablet Take 1 tablet by mouth 2 (two) times daily.      [name] oxyCODONE-acetaminophen (PERCOCET/ROXICET) 5-325 MG per tablet Take 1-2 tablets by mouth every 4 (four) hours as needed for pain.  30 tablet  0  . pantoprazole (PROTONIX) 40 MG tablet Take 40 mg by mouth 2 (two) times daily.      [name] PARoxetine (PAXIL) 30 MG tablet Take 30 mg by mouth at bedtime.       [name] warfarin (COUMADIN) 5 MG tablet Take 1 tablet (5 mg total) by mouth daily.  40 tablet  0   No current facility-administered medications for this visit.    Past Medical History  Diagnosis  Date  . Hyperlipidemia   . Palpitations   . Anxiety   . Fibromyalgia   . GERD (gastroesophageal reflux disease)     Past Surgical History  Procedure Laterality Date  . Embolectomy Left 12/19/2012    Procedure: EMBOLECTOMY OF LEFT BRACHIAL ARTERY;  Surgeon: Fransisco Hertz, MD;  Location: [facility];  Service: Vascular;  Laterality: Left;    ZOX:WRUEAV of systems complete and found to be negative unless listed above  PHYSICAL EXAM BP 127/78  Pulse 76  Ht 5\' 1"  (1.549 m)  Wt 168 lb (76.204 kg)  BMI 31.76 kg/m2  General: Well developed, well nourished, in no acute distress Head: Eyes PERRLA, No xanthomas.   Normal cephalic and atramatic  Lungs: Clear bilaterally to auscultation and percussion. Heart: HRRR S1 S2, without MRG.  Pulses are 2+ & equal.            No carotid bruit. No JVD.  No abdominal bruits. No femoral bruits. Abdomen: Bowel sounds are positive, abdomen soft and non-tender without masses or                  Hernia's noted. Msk:  Back normal, normal gait. Normal  strength and tone for age. Extremities: No clubbing, cyanosis or edema.Dressing to the left brachial area without edema or bleeding. Strong radial pulse.  DP +1 Neuro: Alert and oriented X 3.Essential tremor. Psych:  Good affect, responds appropriately  EKG: SR with PAC's and PVC's..Rate of 74 bpm.  ASSESSMENT AND PLAN

## 2012-12-31 NOTE — Patient Instructions (Addendum)
Your physician recommends that you schedule a follow-up appointment in: 3 months with Dr branch  Your physician recommends that you continue on your current medications as directed. Please refer to the Current Medication list given to you today.

## 2012-12-31 NOTE — Assessment & Plan Note (Signed)
She is doing well with coumadin therapy without symptoms of bleeding. She has good radial pulse. She will continue coumadin for minimum of 3 months. She will follow up with Dr. Wyline Mood for discussion of need to continue anticoagulation.

## 2013-01-01 ENCOUNTER — Encounter: Payer: Self-pay | Admitting: Vascular Surgery

## 2013-01-01 ENCOUNTER — Ambulatory Visit (INDEPENDENT_AMBULATORY_CARE_PROVIDER_SITE_OTHER): Payer: Self-pay | Admitting: Vascular Surgery

## 2013-01-01 VITALS — BP 132/66 | HR 69 | Ht 61.0 in | Wt 168.6 lb

## 2013-01-01 DIAGNOSIS — I742 Embolism and thrombosis of arteries of the upper extremities: Secondary | ICD-10-CM

## 2013-01-01 NOTE — Progress Notes (Signed)
Status post left brachial embolectomy by Dr. Imogene Burn on 12/19/2012. She called yesterday evening stating that she was having some numbness and tingling in her left hand. She reported that it was not cold and she had good grip strength. I asked that she followup today for evaluation.  On physical exam she does have normal grip strength in the left hand. Her antecubital incision is well-healed there are staples present. She does have a 2+ left radial pulse  Impression and plan the normal recovery following brachial embolectomy for ischemic left arm. I discussed this at length with the patient and her family present she will continue to have resolution of the numbness and tingling sensation that she is having. We will DC her staples today and she will followup with Dr. Imogene Burn in 2 months. She is to continue her anticoagulation with Coumadin

## 2013-01-01 NOTE — Progress Notes (Signed)
This encounter was created in error - please disregard.

## 2013-01-03 ENCOUNTER — Ambulatory Visit (INDEPENDENT_AMBULATORY_CARE_PROVIDER_SITE_OTHER): Payer: Medicare Other | Admitting: *Deleted

## 2013-01-03 DIAGNOSIS — I82629 Acute embolism and thrombosis of deep veins of unspecified upper extremity: Secondary | ICD-10-CM | POA: Diagnosis not present

## 2013-01-03 DIAGNOSIS — Z7901 Long term (current) use of anticoagulants: Secondary | ICD-10-CM

## 2013-01-03 DIAGNOSIS — I82622 Acute embolism and thrombosis of deep veins of left upper extremity: Secondary | ICD-10-CM

## 2013-01-04 ENCOUNTER — Encounter: Payer: Medicare Other | Admitting: Vascular Surgery

## 2013-01-10 ENCOUNTER — Ambulatory Visit (INDEPENDENT_AMBULATORY_CARE_PROVIDER_SITE_OTHER): Payer: Medicare Other | Admitting: *Deleted

## 2013-01-10 DIAGNOSIS — I82629 Acute embolism and thrombosis of deep veins of unspecified upper extremity: Secondary | ICD-10-CM | POA: Diagnosis not present

## 2013-01-10 DIAGNOSIS — Z7901 Long term (current) use of anticoagulants: Secondary | ICD-10-CM | POA: Diagnosis not present

## 2013-01-10 DIAGNOSIS — I82622 Acute embolism and thrombosis of deep veins of left upper extremity: Secondary | ICD-10-CM

## 2013-01-10 LAB — POCT INR: INR: 2.5

## 2013-01-17 DIAGNOSIS — R51 Headache: Secondary | ICD-10-CM | POA: Diagnosis not present

## 2013-01-17 DIAGNOSIS — R0989 Other specified symptoms and signs involving the circulatory and respiratory systems: Secondary | ICD-10-CM | POA: Diagnosis not present

## 2013-01-18 ENCOUNTER — Encounter: Payer: Self-pay | Admitting: Family Medicine

## 2013-01-18 LAB — SEDIMENTATION RATE: Sed Rate: 16 mm/hr (ref 0–22)

## 2013-01-21 ENCOUNTER — Ambulatory Visit (INDEPENDENT_AMBULATORY_CARE_PROVIDER_SITE_OTHER): Payer: Medicare Other | Admitting: *Deleted

## 2013-01-21 DIAGNOSIS — I82629 Acute embolism and thrombosis of deep veins of unspecified upper extremity: Secondary | ICD-10-CM | POA: Diagnosis not present

## 2013-01-21 DIAGNOSIS — Z7901 Long term (current) use of anticoagulants: Secondary | ICD-10-CM

## 2013-01-21 DIAGNOSIS — I82622 Acute embolism and thrombosis of deep veins of left upper extremity: Secondary | ICD-10-CM

## 2013-01-21 LAB — POCT INR: INR: 2.3

## 2013-01-29 ENCOUNTER — Other Ambulatory Visit: Payer: Self-pay | Admitting: Family Medicine

## 2013-01-29 NOTE — Telephone Encounter (Signed)
Ok times 4 

## 2013-01-31 DIAGNOSIS — H0230 Blepharochalasis unspecified eye, unspecified eyelid: Secondary | ICD-10-CM | POA: Diagnosis not present

## 2013-01-31 DIAGNOSIS — H40059 Ocular hypertension, unspecified eye: Secondary | ICD-10-CM | POA: Diagnosis not present

## 2013-01-31 DIAGNOSIS — H2510 Age-related nuclear cataract, unspecified eye: Secondary | ICD-10-CM | POA: Diagnosis not present

## 2013-02-05 ENCOUNTER — Other Ambulatory Visit: Payer: Self-pay | Admitting: Family Medicine

## 2013-02-07 ENCOUNTER — Other Ambulatory Visit: Payer: Self-pay

## 2013-02-11 ENCOUNTER — Ambulatory Visit (INDEPENDENT_AMBULATORY_CARE_PROVIDER_SITE_OTHER): Payer: Medicare Other | Admitting: *Deleted

## 2013-02-11 DIAGNOSIS — Z7901 Long term (current) use of anticoagulants: Secondary | ICD-10-CM

## 2013-02-11 DIAGNOSIS — I82622 Acute embolism and thrombosis of deep veins of left upper extremity: Secondary | ICD-10-CM

## 2013-02-11 DIAGNOSIS — I82629 Acute embolism and thrombosis of deep veins of unspecified upper extremity: Secondary | ICD-10-CM | POA: Diagnosis not present

## 2013-02-21 ENCOUNTER — Ambulatory Visit (INDEPENDENT_AMBULATORY_CARE_PROVIDER_SITE_OTHER): Payer: Medicare Other | Admitting: *Deleted

## 2013-02-21 DIAGNOSIS — I82629 Acute embolism and thrombosis of deep veins of unspecified upper extremity: Secondary | ICD-10-CM | POA: Diagnosis not present

## 2013-02-21 DIAGNOSIS — Z7901 Long term (current) use of anticoagulants: Secondary | ICD-10-CM | POA: Diagnosis not present

## 2013-02-21 DIAGNOSIS — I82622 Acute embolism and thrombosis of deep veins of left upper extremity: Secondary | ICD-10-CM

## 2013-02-21 LAB — POCT INR: INR: 3

## 2013-02-21 MED ORDER — WARFARIN SODIUM 5 MG PO TABS: ORAL_TABLET | ORAL | Status: DC

## 2013-02-25 DIAGNOSIS — H2510 Age-related nuclear cataract, unspecified eye: Secondary | ICD-10-CM | POA: Diagnosis not present

## 2013-03-07 ENCOUNTER — Encounter: Payer: Self-pay | Admitting: Vascular Surgery

## 2013-03-07 ENCOUNTER — Ambulatory Visit (INDEPENDENT_AMBULATORY_CARE_PROVIDER_SITE_OTHER): Payer: Medicare Other | Admitting: *Deleted

## 2013-03-07 DIAGNOSIS — Z7901 Long term (current) use of anticoagulants: Secondary | ICD-10-CM

## 2013-03-07 DIAGNOSIS — I82622 Acute embolism and thrombosis of deep veins of left upper extremity: Secondary | ICD-10-CM

## 2013-03-07 DIAGNOSIS — I82629 Acute embolism and thrombosis of deep veins of unspecified upper extremity: Secondary | ICD-10-CM | POA: Diagnosis not present

## 2013-03-07 LAB — POCT INR: INR: 2.4

## 2013-03-08 ENCOUNTER — Encounter: Payer: Self-pay | Admitting: Vascular Surgery

## 2013-03-08 ENCOUNTER — Ambulatory Visit (INDEPENDENT_AMBULATORY_CARE_PROVIDER_SITE_OTHER): Payer: Self-pay | Admitting: Vascular Surgery

## 2013-03-08 VITALS — BP 145/77 | HR 96 | Resp 16 | Ht 60.0 in | Wt 167.0 lb

## 2013-03-08 DIAGNOSIS — I742 Embolism and thrombosis of arteries of the upper extremities: Secondary | ICD-10-CM

## 2013-03-08 NOTE — Progress Notes (Signed)
[facility]  Postoperative Visit  History of Present Illness  Jacqueline Morgan is a 72 y.o. year old female who presents for postoperative follow-up for: L brachial artery TE (Date: 12/19/12).  The patient's wounds are healed.  The patient notes improvement in left forearm numbness.  The patient is able to complete their activities of daily living.  The patient's current symptoms are: radicular sx in upper arm.  For [facility] Use Only  PRE-ADM LIVING: Home  AMB STATUS: Ambulatory  Physical Examination  Filed Vitals:   03/08/13 1515  BP: 145/77  Pulse: 96  Resp: 16   LUE: Incisions are healed, pink warm hand with 5/5 hand grip with intact sensation, palpable radial signal with faintly palpable ulnar artery  Medical Decision Making  Jacqueline Morgan is a 72 y.o. year old female who presents s/p L brachial thromboembolectomy .  The patient sx are not consistent with ischemic neuropathy, as the embolus was in the distal brachial artery in the forearm, so it should have no bearing on any upper arm sx.  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    After 6 months of recovery, it might be helpful to obtain a NCV/EMG to evaluate the left arm for possible etiologies for her sx.  At this point, I would continued with anticoagulation indefinitely due to risk of embolism to other vessel bed.  The patient can follow up with [place] as needed in the future.  Thank you for allowing [place] to participate in this patient's care.  Leonides Sake, MD [facility] Office: [phone] Pager: [phone]  03/08/2013, 3:49 PM

## 2013-03-22 ENCOUNTER — Ambulatory Visit (INDEPENDENT_AMBULATORY_CARE_PROVIDER_SITE_OTHER): Payer: Medicare Other | Admitting: Cardiology

## 2013-03-22 ENCOUNTER — Encounter: Payer: Self-pay | Admitting: Cardiology

## 2013-03-22 DIAGNOSIS — R002 Palpitations: Secondary | ICD-10-CM | POA: Diagnosis not present

## 2013-03-22 DIAGNOSIS — M79609 Pain in unspecified limb: Secondary | ICD-10-CM | POA: Diagnosis not present

## 2013-03-22 NOTE — Progress Notes (Signed)
Clinical Summary Jacqueline Morgan is a 72 y.o.female last seen by NP Lyman Bishop, this is our first visit together. She was seen for the following medical problems.  1. Left brachial artery occlusion - recent admit 12/2012 with left arm pain, found to have occlusion of the left brachial artery.  - underwent left arm thromboembolectomy 12/19/12 by Dr Leonides Sake - echo was negative for thrombus. There was some question of possible aortic arch atherosclerosis. Discharged on coumadin with plan for at least 3 months of therapy. She is followed here for coumadin clinic.   - no recent symptoms of significant arm pain, mild tingling at times.  - still on coumadin - has had some palps in the past. Since starting metorolol reports symptoms have improved. Previously was having episodes of palpitations once weekly, typically would stop with coughing. Denies any associated symptoms. She also reports a history of panic attacks, not sure if her symptoms were related to.     Past Medical History  Diagnosis Date  . Hyperlipidemia   . Palpitations   . Anxiety   . Fibromyalgia   . GERD (gastroesophageal reflux disease)      Allergies  Allergen Reactions  . Codeine     REACTION: Adverse gastrointestinal effects     Current Outpatient Prescriptions  Medication Sig Dispense Refill  . acetaminophen (TYLENOL) 500 MG tablet Take 1,000 mg by mouth once as needed. For headache/pain      . Bromfenac Sodium (PROLENSA) 0.07 % SOLN Apply to eye daily.      . calcium carbonate (OS-CAL) 600 MG TABS Take 600 mg by mouth daily.        [name] enoxaparin (LOVENOX) 100 MG/ML injection       . latanoprost (XALATAN) 0.005 % ophthalmic solution Place 1 drop into both eyes at bedtime.      [name] LORazepam (ATIVAN) 1 MG tablet TAKE ONE TABLET AT NOON, 1/2 AT SUPPER AND 1 AT BEDTIME.  75 tablet  4  . loteprednol (LOTEMAX) 0.5 % ophthalmic suspension 4 (four) times daily.      . magnesium oxide (MAG-OX) 400 MG tablet Take 400 mg  by mouth 2 (two) times daily.        . metoprolol succinate (TOPROL-XL) 50 MG 24 hr tablet Take 1 tablet (50 mg total) by mouth daily.  90 tablet  3  . nabumetone (RELAFEN) 500 MG tablet TAKE (1) TABLET BY MOUTH TWICE DAILY.  60 tablet  2  . oxyCODONE-acetaminophen (PERCOCET/ROXICET) 5-325 MG per tablet Take 1-2 tablets by mouth every 4 (four) hours as needed for pain.  30 tablet  0  . pantoprazole (PROTONIX) 40 MG tablet Take 40 mg by mouth 2 (two) times daily.      [name] PARoxetine (PAXIL) 30 MG tablet Take 30 mg by mouth at bedtime.       [name] warfarin (COUMADIN) 5 MG tablet Take 1/2 tablet daily except 1 tablet on T,TH,SAT  30 tablet  3   No current facility-administered medications for this visit.     Past Surgical History  Procedure Laterality Date  . Embolectomy Left 12/19/2012    Procedure: EMBOLECTOMY OF LEFT BRACHIAL ARTERY;  Surgeon: Fransisco Hertz, MD;  Location: [facility] OR;  Service: Vascular;  Laterality: Left;  [name] Eye surgery       Allergies  Allergen Reactions  . Codeine     REACTION: Adverse gastrointestinal effects      Family History  Problem Relation Age of Onset  .  Cancer Mother     ovarian  . Heart disease Mother      Social History Ms. Baucom reports that she has never smoked. She has never used smokeless tobacco. Ms. Patteson reports that she does not drink alcohol.   Review of Systems CONSTITUTIONAL: No weight loss, fever, chills, weakness or fatigue.  HEENT: Eyes: No visual loss, blurred vision, double vision or yellow sclerae.No hearing loss, sneezing, congestion, runny nose or sore throat.  SKIN: No rash or itching.  CARDIOVASCULAR: per HPI RESPIRATORY: No shortness of breath, cough or sputum.  GASTROINTESTINAL: No anorexia, nausea, vomiting or diarrhea. No abdominal pain or blood.  GENITOURINARY: No burning on urination, no polyuria NEUROLOGICAL: No headache, dizziness, syncope, paralysis, ataxia, numbness or tingling in the extremities. No change in  bowel or bladder control.  MUSCULOSKELETAL: occasional arm numbness.  LYMPHATICS: No enlarged nodes. No history of splenectomy.  PSYCHIATRIC: No history of depression or anxiety.  ENDOCRINOLOGIC: No reports of sweating, cold or heat intolerance. No polyuria or polydipsia.  [name]   Physical Examination p 64 bp 153/59 Wt 168 lbs BMI 33 Gen: resting comfortably, no acute distress HEENT: no scleral icterus, pupils equal round and reactive, no palptable cervical adenopathy,  CV: RRR, no m/r/g, no JVD, on carotid bruits. 2+ radial pulses Resp: Clear to auscultation bilaterally GI: abdomen is soft, non-tender, non-distended, normal bowel sounds, no hepatosplenomegaly MSK: extremities are warm, no edema.  Skin: warm, no rash Neuro:  no focal deficits Psych: appropriate affect   Diagnostic Studies 12/2012 Echo LVEF 65-70%, no WMAs, grade I diastolic dysfunction, no PFO, normal LA.   12/2012 Carotid US IMPRESSION: Mild bilateral carotid atherosclerosis but no hemodynamically significant ICA stenosis on either side. Degree of narrowing less than 50% bilaterally.  12/2012 Chest CT IMPRESSION: 1. Mild atherosclerotic calcification of the aorta for age, within most notable calcification along the inferior wall of the distal arch. 2. Calcified plaque involving the left vertebral artery ostium, which arises directly from the aorta. 3. Small peripheral opacity in the right middle lobe, presumably infectious or inflammatory.     Assessment and Plan  1. 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 Continue until more information known about  possible afib.   Follow up 2 months    Antoine Poche, M.D., F.A.C.C.

## 2013-03-22 NOTE — Patient Instructions (Signed)
Your physician recommends that you schedule a follow-up appointment in: [facility]   Your physician has recommended that you wear an event monitor. Event monitors are medical devices that record the heart's electrical activity. Doctors most often [place] these monitors to diagnose arrhythmias. Arrhythmias are problems with the speed or rhythm of the heartbeat. The monitor is a small, portable device. You can wear one while you do your normal daily activities. This is usually used to diagnose what is causing palpitations/syncope (passing out).  WE WILL MAIL THE MONITOR OUT AFTER THE NEW YEAR AS REQUESTED WE WILL CALL YOU WITH YOUR TEST RESULTS/INSTRUCTIONS/NEXT STEPS ONCE RECEIVED BY THE PROVIDER

## 2013-03-25 DIAGNOSIS — H2510 Age-related nuclear cataract, unspecified eye: Secondary | ICD-10-CM | POA: Diagnosis not present

## 2013-04-01 DIAGNOSIS — H2510 Age-related nuclear cataract, unspecified eye: Secondary | ICD-10-CM | POA: Diagnosis not present

## 2013-04-15 ENCOUNTER — Ambulatory Visit (INDEPENDENT_AMBULATORY_CARE_PROVIDER_SITE_OTHER): Payer: Medicare Other | Admitting: *Deleted

## 2013-04-15 DIAGNOSIS — I82622 Acute embolism and thrombosis of deep veins of left upper extremity: Secondary | ICD-10-CM

## 2013-04-15 DIAGNOSIS — Z7901 Long term (current) use of anticoagulants: Secondary | ICD-10-CM | POA: Diagnosis not present

## 2013-04-15 DIAGNOSIS — I82629 Acute embolism and thrombosis of deep veins of unspecified upper extremity: Secondary | ICD-10-CM

## 2013-04-15 LAB — POCT INR: INR: 3.2

## 2013-04-22 DIAGNOSIS — R002 Palpitations: Secondary | ICD-10-CM

## 2013-05-06 ENCOUNTER — Ambulatory Visit (INDEPENDENT_AMBULATORY_CARE_PROVIDER_SITE_OTHER): Payer: Medicare Other | Admitting: *Deleted

## 2013-05-06 DIAGNOSIS — I82629 Acute embolism and thrombosis of deep veins of unspecified upper extremity: Secondary | ICD-10-CM

## 2013-05-06 DIAGNOSIS — Z7901 Long term (current) use of anticoagulants: Secondary | ICD-10-CM | POA: Diagnosis not present

## 2013-05-06 DIAGNOSIS — I82622 Acute embolism and thrombosis of deep veins of left upper extremity: Secondary | ICD-10-CM

## 2013-05-06 DIAGNOSIS — Z5181 Encounter for therapeutic drug level monitoring: Secondary | ICD-10-CM | POA: Insufficient documentation

## 2013-05-06 LAB — POCT INR: INR: 2.9

## 2013-05-13 ENCOUNTER — Ambulatory Visit: Payer: Medicare Other | Admitting: Cardiology

## 2013-05-14 ENCOUNTER — Other Ambulatory Visit: Payer: Self-pay | Admitting: *Deleted

## 2013-05-14 MED ORDER — PAROXETINE HCL 30 MG PO TABS: 30.0000 mg | ORAL_TABLET | Freq: Every day | ORAL | Status: DC

## 2013-05-15 ENCOUNTER — Other Ambulatory Visit: Payer: Self-pay | Admitting: *Deleted

## 2013-05-15 DIAGNOSIS — R002 Palpitations: Secondary | ICD-10-CM

## 2013-05-22 ENCOUNTER — Telehealth: Payer: Self-pay

## 2013-05-22 NOTE — Telephone Encounter (Signed)
Received End of Service Report from [facility] on this patient.  Placed on Dr.Branch's desk for review

## 2013-06-03 ENCOUNTER — Encounter: Payer: Self-pay | Admitting: Cardiology

## 2013-06-03 ENCOUNTER — Ambulatory Visit (INDEPENDENT_AMBULATORY_CARE_PROVIDER_SITE_OTHER): Payer: Medicare Other | Admitting: *Deleted

## 2013-06-03 ENCOUNTER — Ambulatory Visit (INDEPENDENT_AMBULATORY_CARE_PROVIDER_SITE_OTHER): Payer: Medicare Other | Admitting: Cardiology

## 2013-06-03 VITALS — BP 136/64 | HR 74 | Ht 60.0 in | Wt 167.0 lb

## 2013-06-03 DIAGNOSIS — I82629 Acute embolism and thrombosis of deep veins of unspecified upper extremity: Secondary | ICD-10-CM

## 2013-06-03 DIAGNOSIS — Z7901 Long term (current) use of anticoagulants: Secondary | ICD-10-CM | POA: Diagnosis not present

## 2013-06-03 DIAGNOSIS — Z5181 Encounter for therapeutic drug level monitoring: Secondary | ICD-10-CM

## 2013-06-03 DIAGNOSIS — I82622 Acute embolism and thrombosis of deep veins of left upper extremity: Secondary | ICD-10-CM

## 2013-06-03 DIAGNOSIS — I749 Embolism and thrombosis of unspecified artery: Secondary | ICD-10-CM | POA: Diagnosis not present

## 2013-06-03 LAB — POCT INR: INR: 2.4

## 2013-06-03 NOTE — Progress Notes (Signed)
Clinical Summary Jacqueline Morgan is a 73 y.o.female seen for follow up of th following medical problems.   1. Left brachial artery occlusion  - recent admit 12/2012 with left arm pain, found to have occlusion of the left brachial artery.  - underwent left arm thromboembolectomy 12/19/12 by Dr Adele Barthel  - echo was negative for thrombus. There was some question of possible aortic arch atherosclerosis. Discharged on coumadin with plan for at least 3 months of therapy. She is followed here for coumadin clinic.  - no recent symptoms of significant arm pain, mild tingling at times.  - still on coumadin, the question has been how long to maintain therapy. She completed a recent event monitor, symptoms of palps correlated with NSR, no afib  Past Medical History  Diagnosis Date  . Hyperlipidemia   . Palpitations   . Anxiety   . Fibromyalgia   . GERD (gastroesophageal reflux disease)      Allergies  Allergen Reactions  . Codeine     REACTION: Adverse gastrointestinal effects     Current Outpatient Prescriptions  Medication Sig Dispense Refill  . acetaminophen (TYLENOL) 500 MG tablet Take 1,000 mg by mouth once as needed. For headache/pain      . Bromfenac Sodium (PROLENSA) 0.07 % SOLN Apply to eye daily.      . calcium carbonate (OS-CAL) 600 MG TABS Take 600 mg by mouth daily.        [name] latanoprost (XALATAN) 0.005 % ophthalmic solution Place 1 drop into both eyes at bedtime.      [name] LORazepam (ATIVAN) 1 MG tablet TAKE ONE TABLET AT NOON, 1/2 AT SUPPER AND 1 AT BEDTIME.  75 tablet  4  . loteprednol (LOTEMAX) 0.5 % ophthalmic suspension 4 (four) times daily.      . metoprolol succinate (TOPROL-XL) 50 MG 24 hr tablet Take 1 tablet (50 mg total) by mouth daily.  90 tablet  3  . nabumetone (RELAFEN) 500 MG tablet TAKE (1) TABLET BY MOUTH TWICE DAILY.  60 tablet  2  . oxyCODONE-acetaminophen (PERCOCET/ROXICET) 5-325 MG per tablet Take 1-2 tablets by mouth every 4 (four) hours as needed for  pain.  30 tablet  0  . pantoprazole (PROTONIX) 40 MG tablet Take 40 mg by mouth 2 (two) times daily.      [name] PARoxetine (PAXIL) 30 MG tablet Take 1 tablet (30 mg total) by mouth at bedtime.  90 tablet  0  . warfarin (COUMADIN) 5 MG tablet Take 1/2 tablet daily except 1 tablet on T,TH,SAT  30 tablet  3   No current facility-administered medications for this visit.     Past Surgical History  Procedure Laterality Date  . Embolectomy Left 12/19/2012    Procedure: EMBOLECTOMY OF LEFT BRACHIAL ARTERY;  Surgeon: Jacqueline Forest Park, MD;  Location: [place];  Service: Vascular;  Laterality: Left;  [name] Eye surgery       Allergies  Allergen Reactions  . Codeine     REACTION: Adverse gastrointestinal effects      Family History  Problem Relation Age of Onset  . Cancer Mother     ovarian  . Heart disease Mother      Social History Jacqueline Morgan reports that she has never smoked. She has never used smokeless tobacco. Jacqueline Morgan reports that she does not drink alcohol.   Review of Systems CONSTITUTIONAL: No weight loss, fever, chills, weakness or fatigue.  HEENT: Eyes: No visual loss, blurred vision, double vision or  yellow sclerae.No hearing loss, sneezing, congestion, runny nose or sore throat.  SKIN: No rash or itching.  CARDIOVASCULAR: per HPI RESPIRATORY: No shortness of breath, cough or sputum.  GASTROINTESTINAL: No anorexia, nausea, vomiting or diarrhea. No abdominal pain or blood.  GENITOURINARY: No burning on urination, no polyuria NEUROLOGICAL: No headache, dizziness, syncope, paralysis, ataxia, numbness or tingling in the extremities. No change in bowel or bladder control.  MUSCULOSKELETAL: No muscle, back pain, joint pain or stiffness.  LYMPHATICS: No enlarged nodes. No history of splenectomy.  PSYCHIATRIC: No history of depression or anxiety.  ENDOCRINOLOGIC: No reports of sweating, cold or heat intolerance. No polyuria or polydipsia.  [name]   Physical Examination p 74 bp 136/64  Wt 167 lbs BMI 33 Gen: resting comfortably, no acute distress HEENT: no scleral icterus, pupils equal round and reactive, no palptable cervical adenopathy,  CV: RRR, no m/r/g,no JVD, no carotid bruits Resp: Clear to auscultation bilaterally GI: abdomen is soft, non-tender, non-distended, normal bowel sounds, no hepatosplenomegaly MSK: extremities are warm, no edema.  Skin: warm, no rash Neuro:  no focal deficits Psych: appropriate affect   Diagnostic Studies 05/2013 21 day Event monitor No arrythmias  Assessment and Plan   1. Left brachial artery occlusion  - secondary to thromboembolism, unclear source of the embolism (cardiac vs aortic atheroscerlosis)  - recent event monitor showed no evidence of afib, prior echo without clot.  - discussed risk and benefits of continued anticoagulation, emphasized we do not know the true source of her embolic event - She is in favor of continuing and I agree due to risk or recurrent thromboembolic event, will continue coumadin indefinitely.    Follow up 1 year   Arnoldo Lenis, M.D., F.A.C.C.

## 2013-06-03 NOTE — Patient Instructions (Addendum)
Your physician wants you to follow-up in: 1 year You will receive a reminder letter in the mail two months in advance. If you don't receive a letter, please call our office to schedule the follow-up appointment.    Your physician recommends that you continue on your current medications as directed. Please refer to the Current Medication list given to you today.     Thank you for choosing [facility] !  

## 2013-06-21 ENCOUNTER — Encounter: Payer: Self-pay | Admitting: Nurse Practitioner

## 2013-06-21 ENCOUNTER — Ambulatory Visit (INDEPENDENT_AMBULATORY_CARE_PROVIDER_SITE_OTHER): Payer: Medicare Other | Admitting: Nurse Practitioner

## 2013-06-21 VITALS — BP 136/80 | Ht 60.0 in | Wt 164.0 lb

## 2013-06-21 DIAGNOSIS — I749 Embolism and thrombosis of unspecified artery: Secondary | ICD-10-CM

## 2013-06-21 DIAGNOSIS — N950 Postmenopausal bleeding: Secondary | ICD-10-CM

## 2013-06-21 DIAGNOSIS — Z124 Encounter for screening for malignant neoplasm of cervix: Secondary | ICD-10-CM

## 2013-06-21 NOTE — Patient Instructions (Signed)
Replens  Luvena

## 2013-06-24 LAB — PAP IG W/ RFLX HPV ASCU

## 2013-06-25 ENCOUNTER — Encounter: Payer: Self-pay | Admitting: Nurse Practitioner

## 2013-06-25 NOTE — Progress Notes (Signed)
Subjective:  Presents complaints of some vaginal bleeding, first noticed some brown discharge/spotting after urination 2 weeks ago. Last night had a slightly larger amount of orange tinted blood. Patient is confident it came from the vaginal area. Has been post menopausal for many years. Has not had a Pap smear or pelvic exam in over 40 years. Her husband passed away recently, has not had any new sexual partners. No fever or chills. Some constipation. No burning pain or bleeding from the rectal area. Some vaginal itching and dryness. No discharge. No pelvic pain. Has history of chronic urinary incontinence, does not wear a pad. [facility] her mother had ovarian cancer.  Objective:   BP 136/80  Ht 5' (1.524 m)  Wt 164 lb (74.39 kg)  BMI 32.03 kg/m2 NAD. Alert, oriented. Lungs clear. Heart regular rate rhythm. Abdomen soft nondistended nontender. External GU skin pale and dry, no active bleeding or lesions noted. Vagina pale and dry. Cervix mildly friable with minimal pressure, Pap smear obtained. No lesions noted. No CMT. No vaginal discharge. Bimanual exam: No obvious masses, ovaries nonpalpable. Nontender.  Assessment:Post-menopausal bleeding - Plan: Pap IG w/ reflex to HPV when ASC-U, US Pelvis Complete, US Transvaginal Non-OB  Screening for cervical cancer - Plan: Pap IG w/ reflex to HPV when ASC-U, US Pelvis Complete, US Transvaginal Non-OB  Plan:  Further followup based on test results.

## 2013-06-26 ENCOUNTER — Other Ambulatory Visit: Payer: Self-pay | Admitting: Cardiology

## 2013-06-26 MED ORDER — METOPROLOL SUCCINATE ER 50 MG PO TB24: 50.0000 mg | ORAL_TABLET | Freq: Every day | ORAL | Status: DC

## 2013-06-27 ENCOUNTER — Ambulatory Visit (HOSPITAL_COMMUNITY)
Admission: RE | Admit: 2013-06-27 | Discharge: 2013-06-27 | Disposition: A | Discharge status: Home / Self Care | Payer: Medicare Other | Source: Ambulatory Visit | Attending: Nurse Practitioner | Admitting: Nurse Practitioner

## 2013-06-27 ENCOUNTER — Telehealth: Payer: Self-pay | Admitting: *Deleted

## 2013-06-27 DIAGNOSIS — N950 Postmenopausal bleeding: Secondary | ICD-10-CM | POA: Insufficient documentation

## 2013-06-27 DIAGNOSIS — R9389 Abnormal findings on diagnostic imaging of other specified body structures: Secondary | ICD-10-CM | POA: Insufficient documentation

## 2013-06-27 DIAGNOSIS — Z124 Encounter for screening for malignant neoplasm of cervix: Secondary | ICD-10-CM

## 2013-06-27 MED ORDER — METOPROLOL SUCCINATE ER 50 MG PO TB24: 50.0000 mg | ORAL_TABLET | Freq: Every day | ORAL | Status: DC

## 2013-06-27 NOTE — Telephone Encounter (Signed)
Refill sent to [facility] per request

## 2013-06-27 NOTE — Telephone Encounter (Signed)
Pt had toprolol called in to mail order but she will not have it before she runs out. She would like [place] to call a weeks supply in to [facility].

## 2013-07-01 ENCOUNTER — Ambulatory Visit (INDEPENDENT_AMBULATORY_CARE_PROVIDER_SITE_OTHER): Payer: Medicare Other | Admitting: *Deleted

## 2013-07-01 DIAGNOSIS — Z7901 Long term (current) use of anticoagulants: Secondary | ICD-10-CM

## 2013-07-01 DIAGNOSIS — I82629 Acute embolism and thrombosis of deep veins of unspecified upper extremity: Secondary | ICD-10-CM

## 2013-07-01 DIAGNOSIS — I82622 Acute embolism and thrombosis of deep veins of left upper extremity: Secondary | ICD-10-CM

## 2013-07-01 DIAGNOSIS — Z5181 Encounter for therapeutic drug level monitoring: Secondary | ICD-10-CM | POA: Diagnosis not present

## 2013-07-01 LAB — POCT INR: INR: 3.6

## 2013-07-01 NOTE — Progress Notes (Signed)
Patient notified and verbalized understanding of the test results. No further questions. 

## 2013-07-02 DIAGNOSIS — D249 Benign neoplasm of unspecified breast: Secondary | ICD-10-CM | POA: Diagnosis not present

## 2013-07-02 DIAGNOSIS — Z9889 Other specified postprocedural states: Secondary | ICD-10-CM | POA: Diagnosis not present

## 2013-07-02 DIAGNOSIS — Z09 Encounter for follow-up examination after completed treatment for conditions other than malignant neoplasm: Secondary | ICD-10-CM | POA: Diagnosis not present

## 2013-07-22 ENCOUNTER — Ambulatory Visit (INDEPENDENT_AMBULATORY_CARE_PROVIDER_SITE_OTHER): Payer: Medicare Other | Admitting: *Deleted

## 2013-07-22 DIAGNOSIS — Z5181 Encounter for therapeutic drug level monitoring: Secondary | ICD-10-CM | POA: Diagnosis not present

## 2013-07-22 DIAGNOSIS — I82629 Acute embolism and thrombosis of deep veins of unspecified upper extremity: Secondary | ICD-10-CM

## 2013-07-22 DIAGNOSIS — Z7901 Long term (current) use of anticoagulants: Secondary | ICD-10-CM

## 2013-07-22 DIAGNOSIS — I82622 Acute embolism and thrombosis of deep veins of left upper extremity: Secondary | ICD-10-CM

## 2013-07-22 LAB — POCT INR: INR: 2.5

## 2013-07-29 ENCOUNTER — Encounter: Payer: Self-pay | Admitting: Family Medicine

## 2013-07-31 DIAGNOSIS — H40059 Ocular hypertension, unspecified eye: Secondary | ICD-10-CM | POA: Diagnosis not present

## 2013-07-31 DIAGNOSIS — Z961 Presence of intraocular lens: Secondary | ICD-10-CM | POA: Diagnosis not present

## 2013-07-31 DIAGNOSIS — H02839 Dermatochalasis of unspecified eye, unspecified eyelid: Secondary | ICD-10-CM | POA: Diagnosis not present

## 2013-07-31 DIAGNOSIS — H26499 Other secondary cataract, unspecified eye: Secondary | ICD-10-CM | POA: Diagnosis not present

## 2013-08-15 ENCOUNTER — Other Ambulatory Visit: Payer: Self-pay | Admitting: Adult Health

## 2013-08-19 ENCOUNTER — Ambulatory Visit (INDEPENDENT_AMBULATORY_CARE_PROVIDER_SITE_OTHER): Payer: Medicare Other | Admitting: *Deleted

## 2013-08-19 DIAGNOSIS — Z5181 Encounter for therapeutic drug level monitoring: Secondary | ICD-10-CM | POA: Diagnosis not present

## 2013-08-19 DIAGNOSIS — Z7901 Long term (current) use of anticoagulants: Secondary | ICD-10-CM

## 2013-08-19 DIAGNOSIS — I82629 Acute embolism and thrombosis of deep veins of unspecified upper extremity: Secondary | ICD-10-CM | POA: Diagnosis not present

## 2013-08-19 DIAGNOSIS — I82622 Acute embolism and thrombosis of deep veins of left upper extremity: Secondary | ICD-10-CM

## 2013-08-19 LAB — POCT INR: INR: 2.9

## 2013-08-27 ENCOUNTER — Other Ambulatory Visit: Payer: Self-pay | Admitting: *Deleted

## 2013-08-27 MED ORDER — PAROXETINE HCL 30 MG PO TABS: 30.0000 mg | ORAL_TABLET | Freq: Every day | ORAL | Status: DC

## 2013-09-16 ENCOUNTER — Ambulatory Visit (INDEPENDENT_AMBULATORY_CARE_PROVIDER_SITE_OTHER): Payer: Medicare Other | Admitting: *Deleted

## 2013-09-16 DIAGNOSIS — Z7901 Long term (current) use of anticoagulants: Secondary | ICD-10-CM | POA: Diagnosis not present

## 2013-09-16 DIAGNOSIS — I82622 Acute embolism and thrombosis of deep veins of left upper extremity: Secondary | ICD-10-CM

## 2013-09-16 DIAGNOSIS — I82629 Acute embolism and thrombosis of deep veins of unspecified upper extremity: Secondary | ICD-10-CM | POA: Diagnosis not present

## 2013-09-16 DIAGNOSIS — Z5181 Encounter for therapeutic drug level monitoring: Secondary | ICD-10-CM | POA: Diagnosis not present

## 2013-09-16 LAB — POCT INR: INR: 2.2

## 2013-09-17 ENCOUNTER — Telehealth: Payer: Self-pay | Admitting: Family Medicine

## 2013-09-17 NOTE — Telephone Encounter (Signed)
Rx prior auth APPROVED for pt's PANTOPRAZOLE 40mg  BID dose, expires 09/17/2014 through [facility], they notified pt of approval

## 2013-09-30 ENCOUNTER — Other Ambulatory Visit: Payer: Self-pay | Admitting: Family Medicine

## 2013-09-30 ENCOUNTER — Telehealth: Payer: Self-pay | Admitting: Cardiology

## 2013-09-30 NOTE — Telephone Encounter (Signed)
Please see paper in refill bin / tgs  °

## 2013-09-30 NOTE — Telephone Encounter (Signed)
May refill this +2 additional refills 

## 2013-09-30 NOTE — Telephone Encounter (Signed)
Last seen 06/21/13 (sick)

## 2013-10-02 ENCOUNTER — Telehealth: Payer: Self-pay | Admitting: *Deleted

## 2013-10-02 NOTE — Telephone Encounter (Signed)
Pt has been has been on toprol XL since 2012. i guess it is an economic thing with insurance.do you want to change pt to tartrate?

## 2013-10-02 NOTE — Telephone Encounter (Signed)
[employer] IS CALLING TO GET MORE INFORMATION ON THE METOPROLOL THAT THE PATIENT IS ON. THEY NEED TO KNOW IF THE PATIENT HAS EVER TRIED THE NON EXTENDED AND WHAT THE DIAGNOSIS IS FOR PT TO BE TAKING IT/TMJ

## 2013-10-03 NOTE — Telephone Encounter (Signed)
Pt has decided to pay $15 for 90 day supply of Toprol XL.She will call me in 2 months and we will switch to tartrate which will be free for her

## 2013-10-03 NOTE — Telephone Encounter (Signed)
Medication was started sometime around 2011 by her previous cardiologist Dr Lattie Haw for palpitations. From his available records does not appear that metoprolol tartrate was tried. If insurance does not want to pay for metoprolol succinate changing to metoprolol tartrate 50mg  bid is reasonable alternative.   Zandra Abts MD

## 2013-10-09 ENCOUNTER — Telehealth: Payer: Self-pay | Admitting: Cardiology

## 2013-10-09 NOTE — Telephone Encounter (Signed)
Patient is requested Tier exception for Metoprolol and it is has been approved / tgs

## 2013-10-10 NOTE — Telephone Encounter (Signed)
Called pt to clarify approval of metoprolol and she uses mail order and is now on tier 1 and has received 3 months supply.

## 2013-10-14 ENCOUNTER — Other Ambulatory Visit: Payer: Self-pay | Admitting: *Deleted

## 2013-10-14 ENCOUNTER — Telehealth: Payer: Self-pay | Admitting: *Deleted

## 2013-10-14 MED ORDER — NABUMETONE 500 MG PO TABS: 500.0000 mg | ORAL_TABLET | Freq: Two times a day (BID) | ORAL | Status: DC | PRN

## 2013-10-14 NOTE — Telephone Encounter (Signed)
May refill times 3 needs OV by Oct

## 2013-10-14 NOTE — Telephone Encounter (Signed)
Requesting refill on relafen. [facility]. Last seen 06/21/13

## 2013-10-14 NOTE — Telephone Encounter (Signed)
Med sent to pharm. Pt's daughter notified.

## 2013-10-18 ENCOUNTER — Encounter (HOSPITAL_COMMUNITY): Payer: Self-pay | Admitting: Emergency Medicine

## 2013-10-18 ENCOUNTER — Emergency Department (HOSPITAL_COMMUNITY)
Admission: EM | Admit: 2013-10-18 | Discharge: 2013-10-19 | Disposition: A | Discharge status: Home / Self Care | Payer: Medicare Other | Attending: Emergency Medicine | Admitting: Emergency Medicine

## 2013-10-18 DIAGNOSIS — K219 Gastro-esophageal reflux disease without esophagitis: Secondary | ICD-10-CM | POA: Diagnosis not present

## 2013-10-18 DIAGNOSIS — R110 Nausea: Secondary | ICD-10-CM | POA: Diagnosis not present

## 2013-10-18 DIAGNOSIS — D689 Coagulation defect, unspecified: Secondary | ICD-10-CM | POA: Diagnosis not present

## 2013-10-18 DIAGNOSIS — Z79899 Other long term (current) drug therapy: Secondary | ICD-10-CM | POA: Insufficient documentation

## 2013-10-18 DIAGNOSIS — Y92009 Unspecified place in unspecified non-institutional (private) residence as the place of occurrence of the external cause: Secondary | ICD-10-CM | POA: Insufficient documentation

## 2013-10-18 DIAGNOSIS — Z7901 Long term (current) use of anticoagulants: Secondary | ICD-10-CM | POA: Insufficient documentation

## 2013-10-18 DIAGNOSIS — R296 Repeated falls: Secondary | ICD-10-CM | POA: Insufficient documentation

## 2013-10-18 DIAGNOSIS — S0993XA Unspecified injury of face, initial encounter: Secondary | ICD-10-CM | POA: Diagnosis not present

## 2013-10-18 DIAGNOSIS — R109 Unspecified abdominal pain: Secondary | ICD-10-CM | POA: Insufficient documentation

## 2013-10-18 DIAGNOSIS — Y9389 Activity, other specified: Secondary | ICD-10-CM | POA: Insufficient documentation

## 2013-10-18 DIAGNOSIS — R55 Syncope and collapse: Secondary | ICD-10-CM | POA: Insufficient documentation

## 2013-10-18 DIAGNOSIS — S0100XA Unspecified open wound of scalp, initial encounter: Secondary | ICD-10-CM | POA: Diagnosis not present

## 2013-10-18 DIAGNOSIS — E785 Hyperlipidemia, unspecified: Secondary | ICD-10-CM | POA: Diagnosis not present

## 2013-10-18 DIAGNOSIS — S0190XA Unspecified open wound of unspecified part of head, initial encounter: Secondary | ICD-10-CM | POA: Diagnosis present

## 2013-10-18 DIAGNOSIS — Z9889 Other specified postprocedural states: Secondary | ICD-10-CM | POA: Insufficient documentation

## 2013-10-18 DIAGNOSIS — F411 Generalized anxiety disorder: Secondary | ICD-10-CM | POA: Diagnosis not present

## 2013-10-18 DIAGNOSIS — S0101XA Laceration without foreign body of scalp, initial encounter: Secondary | ICD-10-CM

## 2013-10-18 DIAGNOSIS — W19XXXA Unspecified fall, initial encounter: Secondary | ICD-10-CM

## 2013-10-18 DIAGNOSIS — M542 Cervicalgia: Secondary | ICD-10-CM | POA: Diagnosis not present

## 2013-10-18 NOTE — ED Notes (Signed)
Patient states she began having abdominal cramps and nausea and was in the bathroom and fell.  Patient with approximate 4-5 inch laceration.

## 2013-10-19 ENCOUNTER — Other Ambulatory Visit: Payer: Self-pay | Admitting: Nurse Practitioner

## 2013-10-19 ENCOUNTER — Emergency Department (HOSPITAL_COMMUNITY): Payer: Medicare Other

## 2013-10-19 DIAGNOSIS — S0100XA Unspecified open wound of scalp, initial encounter: Secondary | ICD-10-CM | POA: Diagnosis not present

## 2013-10-19 DIAGNOSIS — M542 Cervicalgia: Secondary | ICD-10-CM | POA: Diagnosis not present

## 2013-10-19 DIAGNOSIS — S0993XA Unspecified injury of face, initial encounter: Secondary | ICD-10-CM | POA: Diagnosis not present

## 2013-10-19 LAB — PROTIME-INR
INR: 1.87 — AB (ref 0.00–1.49)
Prothrombin Time: 21.5 seconds — ABNORMAL HIGH (ref 11.6–15.2)

## 2013-10-19 MED ORDER — LIDOCAINE-EPINEPHRINE (PF) 1 %-1:200000 IJ SOLN
10.0000 mL | Freq: Once | INTRAMUSCULAR | Status: AC
  Administered 2013-10-19: 10 mL

## 2013-10-19 MED ORDER — ONDANSETRON 8 MG PO TBDP: 8.0000 mg | ORAL_TABLET | Freq: Three times a day (TID) | ORAL | Status: DC | PRN

## 2013-10-19 MED ORDER — ONDANSETRON 8 MG PO TBDP
8.0000 mg | ORAL_TABLET | Freq: Once | ORAL | Status: AC
  Administered 2013-10-19: 8 mg via ORAL
  Filled 2013-10-19: qty 1

## 2013-10-19 NOTE — ED Provider Notes (Signed)
CSN: [id number]     Arrival date & time 10/18/13  2342 History   First MD Initiated Contact with Patient 10/19/13 0009   This chart was scribed for Delora Fuel, MD by Rosary Lively, ED scribe. This patient was seen in room APA18/APA18 and the patient's care was started at 12:15 AM.     Chief Complaint  Patient presents with  . Head Laceration   The history is provided by the patient. No language interpreter was used.   HPI Comments:  Jacqueline Morgan is a 73 y.o. female who presents to the Emergency Department complaining of a head laceration, with associated symtpoms of LOC,  nausea, and abdominal cramps,onset today. Pt reports that her head pain is a five out of 10 on pain scale. Pt reports that her last episode with these symptoms was 1 year ago. Pt reports that she has seen a Cardiologist for issue and has been kept on blood thinner because the location of the clot is unknown. Pt reports that Tetanus is UTD.   Past Medical History  Diagnosis Date  . Hyperlipidemia   . Palpitations   . Anxiety   . Fibromyalgia   . GERD (gastroesophageal reflux disease)    Past Surgical History  Procedure Laterality Date  . Embolectomy Left 12/19/2012    Procedure: EMBOLECTOMY OF LEFT BRACHIAL ARTERY;  Surgeon: Conrad Murchison, MD;  Location: [place];  Service: Vascular;  Laterality: Left;  [name] Eye surgery     Family History  Problem Relation Age of Onset  . Cancer Mother     ovarian  . Heart disease Mother    History  Substance Use Topics  . Smoking status: Never Smoker   . Smokeless tobacco: Never Used  . Alcohol Use: No   OB History   Grav Para Term Preterm Abortions TAB SAB Ect Mult Living                 Review of Systems  Gastrointestinal: Positive for nausea and abdominal pain.  Neurological: Positive for syncope.  All other systems reviewed and are negative.     Allergies  Codeine  Home Medications   Prior to Admission medications   Medication Sig Start Date End Date  Taking? Authorizing Provider  acetaminophen (TYLENOL) 500 MG tablet Take 1,000 mg by mouth once as needed. For headache/pain   Yes Historical Provider, MD  calcium carbonate (OS-CAL) 600 MG TABS Take 600 mg by mouth daily.     Yes Historical Provider, MD  latanoprost (XALATAN) 0.005 % ophthalmic solution  05/16/13  Yes Historical Provider, MD  LORazepam (ATIVAN) 1 MG tablet TAKE ONE TABLET AT NOON, 1/2 AT SUPPER AND 1 AT BEDTIME.   Yes Kathyrn Drown, MD  metoprolol succinate (TOPROL-XL) 50 MG 24 hr tablet Take 1 tablet (50 mg total) by mouth daily. 06/27/13  Yes Arnoldo Lenis, MD  nabumetone (RELAFEN) 500 MG tablet Take 1 tablet (500 mg total) by mouth 2 (two) times daily as needed. 10/14/13  Yes Kathyrn Drown, MD  pantoprazole (PROTONIX) 40 MG tablet Take 40 mg by mouth 2 (two) times daily.   Yes Historical Provider, MD  PARoxetine (PAXIL) 30 MG tablet Take 1 tablet (30 mg total) by mouth at bedtime. 08/27/13  Yes Kathyrn Drown, MD  warfarin (COUMADIN) 5 MG tablet Take 1/2 tablet daily except 1 tablet on Tuesdays and Saturdays 08/15/13  Yes Arnoldo Lenis, MD   BP 135/50  Pulse 57  Resp 12  Ht 5' (1.524 m)  Wt 165 lb (74.844 kg)  BMI 32.22 kg/m2  SpO2 94% Physical Exam  HENT:  8cm laceration of the scalp to the right of midline and the frontal area.  Neurological:  Mild resting tremor.    ED Course  Procedures  DIAGNOSTIC STUDIES: Oxygen Saturation is 94% on RA, low  by my interpretation.  COORDINATION OF CARE: 12:20 AM-Discussed treatment plan with pt at bedside and pt agreed to plan.  LACERATION REPAIR Performed by: VZDGL,OVFIE Authorized by: PPIRJ,JOACZ Consent: Verbal consent obtained. Risks and benefits: risks, benefits and alternatives were discussed Consent given by: patient Patient identity confirmed: provided demographic data Prepped and Draped in normal sterile fashion Wound explored  Laceration Location: scalp  Laceration Length: 8.0 cm  No Foreign  Bodies seen or palpated  Anesthesia: local infiltration  Local anesthetic: lidocaine 1% with epinephrine  Anesthetic total: 7 ml  Amount of cleaning: standard  Skin closure: close  Technique: stapling  Number of staples: 8  Patient tolerance: Patient tolerated the procedure well with no immediate complications.\   EKG Interpretation   Date/Time:  Friday October 18 2013 23:58:03 EDT Ventricular Rate:  57 PR Interval:  188 QRS Duration: 82 QT Interval:  475 QTC Calculation: 462 R Axis:   -33 Text Interpretation:  Sinus rhythm Left axis deviation Low voltage,  precordial leads Consider anterior infarct Nonspecific T abnormalities,  lateral leads When compared with ECG of 12/09/2012, No significant change  was found Confirmed by [facility]  MD, Lillion Elbert ([id number]) on 10/19/2013 12:12:40 AM      MDM   Final diagnoses:  Fall at home, initial encounter  Laceration of scalp without complication, initial encounter  Anticoagulated on warfarin    Fall with scalp laceration. She is sent for CT of head and cervical spine which are unremarkable. She needs to be watched for delayed signs of bleeding and this is explained to patient and family. Laceration is closed with staples and she is advised of staples removed in one-10 days. INR has come back slightly subtherapeutic but probably not to a degree to warrant change in warfarin dose. I personally performed the services described in this documentation, which was scribed in my presence. The recorded information has been reviewed and is accurate.       Delora Fuel, MD 01/60/10 9323

## 2013-10-19 NOTE — ED Notes (Signed)
MD at bedside. 

## 2013-10-19 NOTE — Discharge Instructions (Signed)
Staples need to be removed in 7-10 days. This can be done at your doctor's office, in an Urgent [facility], or in the ED.  Staple Care and Removal Your caregiver has used staples today to repair your wound. Staples are used to help a wound heal faster by holding the edges of the wound together. The staples can be removed when the wound has healed well enough to stay together after the staples are removed. A dressing (wound covering), depending on the location of the wound, may have been applied. This may be changed once per day or as instructed. If the dressing sticks, it may be soaked off with soapy water or hydrogen peroxide. Only take over-the-counter or prescription medicines for pain, discomfort, or fever as directed by your caregiver.  If you did not receive a tetanus shot today because you did not recall when your last one was given, check with your caregiver when you have your staples removed to determine if one is needed. Return to your caregiver's office in 1 week or as suggested to have your staples removed. SEEK IMMEDIATE MEDICAL CARE IF:   You have redness, swelling, or increasing pain in the wound.  You have pus coming from the wound.  You have a fever.  You notice a bad smell coming from the wound or dressing.  Your wound edges break open after staples have been removed. Document Released: 12/14/2000 Document Revised: 06/13/2011 Document Reviewed: 12/29/2004 [facility] Patient Information 2015 [place], [place]. This information is not intended to replace advice given to you by your health care provider. Make sure you discuss any questions you have with your health care provider.  Fall Prevention and Home Safety Falls cause injuries and can affect all age groups. It is possible to use preventive measures to significantly decrease the likelihood of falls. There are many simple measures which can make your home safer and prevent falls. OUTDOORS  Repair cracks and edges of walkways  and driveways.  Remove high doorway thresholds.  Trim shrubbery on the main path into your home.  Have good outside lighting.  Clear walkways of tools, rocks, debris, and clutter.  Check that handrails are not broken and are securely fastened. Both sides of steps should have handrails.  Have leaves, snow, and ice cleared regularly.  Use sand or salt on walkways during winter months.  In the garage, clean up grease or oil spills. BATHROOM  Install night lights.  Install grab bars by the toilet and in the tub and shower.  Use non-skid mats or decals in the tub or shower.  Place a plastic non-slip stool in the shower to sit on, if needed.  Keep floors dry and clean up all water on the floor immediately.  Remove soap buildup in the tub or shower on a regular basis.  Secure bath mats with non-slip, double-sided rug tape.  Remove throw rugs and tripping hazards from the floors. BEDROOMS  Install night lights.  Make sure a bedside light is easy to reach.  Do not use oversized bedding.  Keep a telephone by your bedside.  Have a firm chair with side arms to use for getting dressed.  Remove throw rugs and tripping hazards from the floor. KITCHEN  Keep handles on pots and pans turned toward the center of the stove. Use back burners when possible.  Clean up spills quickly and allow time for drying.  Avoid walking on wet floors.  Avoid hot utensils and knives.  Position shelves so they are not  too high or low.  Place commonly used objects within easy reach.  If necessary, use a sturdy step stool with a grab bar when reaching.  Keep electrical cables out of the way.  Do not use floor polish or wax that makes floors slippery. If you must use wax, use non-skid floor wax.  Remove throw rugs and tripping hazards from the floor. STAIRWAYS  Never leave objects on stairs.  Place handrails on both sides of stairways and use them. Fix any loose handrails. Make sure  handrails on both sides of the stairways are as long as the stairs.  Check carpeting to make sure it is firmly attached along stairs. Make repairs to worn or loose carpet promptly.  Avoid placing throw rugs at the top or bottom of stairways, or properly secure the rug with carpet tape to prevent slippage. Get rid of throw rugs, if possible.  Have an electrician put in a light switch at the top and bottom of the stairs. OTHER FALL PREVENTION TIPS  Wear low-heel or rubber-soled shoes that are supportive and fit well. Wear closed toe shoes.  When using a stepladder, make sure it is fully opened and both spreaders are firmly locked. Do not climb a closed stepladder.  Add color or contrast paint or tape to grab bars and handrails in your home. Place contrasting color strips on first and last steps.  Learn and use mobility aids as needed. Install an electrical emergency response system.  Turn on lights to avoid dark areas. Replace light bulbs that burn out immediately. Get light switches that glow.  Arrange furniture to create clear pathways. Keep furniture in the same place.  Firmly attach carpet with non-skid or double-sided tape.  Eliminate uneven floor surfaces.  Select a carpet pattern that does not visually hide the edge of steps.  Be aware of all pets. OTHER HOME SAFETY TIPS  Set the water temperature for 120 F (48.8 C).  Keep emergency numbers on or near the telephone.  Keep smoke detectors on every level of the home and near sleeping areas. Document Released: 03/11/2002 Document Revised: 09/20/2011 Document Reviewed: 06/10/2011 [facility] Patient Information 2015 [place], [place]. This information is not intended to replace advice given to you by your health care provider. Make sure you discuss any questions you have with your health care provider.

## 2013-10-25 ENCOUNTER — Encounter: Payer: Self-pay | Admitting: Family Medicine

## 2013-10-25 ENCOUNTER — Ambulatory Visit (INDEPENDENT_AMBULATORY_CARE_PROVIDER_SITE_OTHER): Payer: Medicare Other | Admitting: Family Medicine

## 2013-10-25 VITALS — BP 124/80 | Resp 18 | Ht 60.0 in | Wt 165.0 lb

## 2013-10-25 DIAGNOSIS — E041 Nontoxic single thyroid nodule: Secondary | ICD-10-CM

## 2013-10-25 DIAGNOSIS — I749 Embolism and thrombosis of unspecified artery: Secondary | ICD-10-CM | POA: Diagnosis not present

## 2013-10-25 DIAGNOSIS — R55 Syncope and collapse: Secondary | ICD-10-CM | POA: Diagnosis not present

## 2013-10-25 NOTE — Progress Notes (Signed)
   Subjective:    Patient ID: Jacqueline Morgan, female    DOB: October 29, 1940, 73 y.o.   MRN: [id number]  HPI Patient is here for follow-up after E.R. Visit on last Friday July 17,2015. Patient stated that she passed out & hit her head while in bathroom. Patient had bad stomach cramps prior to incident. Laceration 6 days ago.  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 We talked about different ways to try to avoid having this lead to another injury  She will be having her INR checked at cardiology on Monday  Half the staples were removed the other half will be removed on Wednesday  Patient has not been able to do colonoscopy in the past because of severe vomiting issues with trying the prep she has been Hemoccult cards in the past which have been negative.  Thyroid nodule noted on CAT scan set up thyroid ultrasound await the result for this.

## 2013-10-28 ENCOUNTER — Ambulatory Visit (INDEPENDENT_AMBULATORY_CARE_PROVIDER_SITE_OTHER): Payer: Medicare Other | Admitting: *Deleted

## 2013-10-28 DIAGNOSIS — I82629 Acute embolism and thrombosis of deep veins of unspecified upper extremity: Secondary | ICD-10-CM | POA: Diagnosis not present

## 2013-10-28 DIAGNOSIS — Z5181 Encounter for therapeutic drug level monitoring: Secondary | ICD-10-CM | POA: Diagnosis not present

## 2013-10-28 DIAGNOSIS — Z7901 Long term (current) use of anticoagulants: Secondary | ICD-10-CM | POA: Diagnosis not present

## 2013-10-28 DIAGNOSIS — I82622 Acute embolism and thrombosis of deep veins of left upper extremity: Secondary | ICD-10-CM

## 2013-10-28 LAB — POCT INR: INR: 2.9

## 2013-10-29 ENCOUNTER — Ambulatory Visit (HOSPITAL_COMMUNITY)
Admission: RE | Admit: 2013-10-29 | Discharge: 2013-10-29 | Disposition: A | Discharge status: Home / Self Care | Payer: Medicare Other | Source: Ambulatory Visit | Attending: Family Medicine | Admitting: Family Medicine

## 2013-10-29 DIAGNOSIS — E041 Nontoxic single thyroid nodule: Secondary | ICD-10-CM | POA: Diagnosis not present

## 2013-10-29 DIAGNOSIS — E042 Nontoxic multinodular goiter: Secondary | ICD-10-CM | POA: Diagnosis not present

## 2013-10-30 ENCOUNTER — Encounter: Payer: Self-pay | Admitting: Family Medicine

## 2013-10-30 ENCOUNTER — Ambulatory Visit (INDEPENDENT_AMBULATORY_CARE_PROVIDER_SITE_OTHER): Payer: Medicare Other | Admitting: Family Medicine

## 2013-10-30 VITALS — BP 108/68 | Ht 60.0 in | Wt 165.0 lb

## 2013-10-30 DIAGNOSIS — R21 Rash and other nonspecific skin eruption: Secondary | ICD-10-CM | POA: Diagnosis not present

## 2013-10-30 DIAGNOSIS — I749 Embolism and thrombosis of unspecified artery: Secondary | ICD-10-CM | POA: Diagnosis not present

## 2013-10-30 NOTE — Progress Notes (Signed)
   Subjective:    Patient ID: Jacqueline Morgan, female    DOB: September 03, 1940, 73 y.o.   MRN: [id number]  HPIStaple removal. No other concerns.  No significant discomfort at this time no fever no chills no drainage from wound. Has artery seen Dr. Nicki Reaper for syncope discussion.   Review of Systems Otherwise as noted    Objective:   Physical Exam  Vital signs stable alert no apparent distress slight irritation rash around the laceration site. Staples intact      Assessment & Plan:  Impression wound with expected features plan staples removed. Concerns discussed. WSL

## 2013-12-04 ENCOUNTER — Other Ambulatory Visit: Payer: Self-pay | Admitting: Family Medicine

## 2013-12-05 ENCOUNTER — Ambulatory Visit (INDEPENDENT_AMBULATORY_CARE_PROVIDER_SITE_OTHER): Payer: Medicare Other | Admitting: Pharmacist

## 2013-12-05 DIAGNOSIS — I82622 Acute embolism and thrombosis of deep veins of left upper extremity: Secondary | ICD-10-CM

## 2013-12-05 DIAGNOSIS — Z5181 Encounter for therapeutic drug level monitoring: Secondary | ICD-10-CM

## 2013-12-05 DIAGNOSIS — I82629 Acute embolism and thrombosis of deep veins of unspecified upper extremity: Secondary | ICD-10-CM

## 2013-12-05 DIAGNOSIS — Z7901 Long term (current) use of anticoagulants: Secondary | ICD-10-CM | POA: Diagnosis not present

## 2013-12-05 LAB — POCT INR: INR: 3.4

## 2013-12-12 ENCOUNTER — Telehealth: Payer: Self-pay

## 2013-12-12 NOTE — Telephone Encounter (Signed)
error 

## 2014-01-06 ENCOUNTER — Ambulatory Visit (INDEPENDENT_AMBULATORY_CARE_PROVIDER_SITE_OTHER): Payer: Medicare Other | Admitting: Pharmacist

## 2014-01-06 DIAGNOSIS — Z7901 Long term (current) use of anticoagulants: Secondary | ICD-10-CM

## 2014-01-06 DIAGNOSIS — I82622 Acute embolism and thrombosis of deep veins of left upper extremity: Secondary | ICD-10-CM

## 2014-01-06 DIAGNOSIS — Z5181 Encounter for therapeutic drug level monitoring: Secondary | ICD-10-CM | POA: Diagnosis not present

## 2014-01-06 LAB — POCT INR: INR: 3

## 2014-01-08 DIAGNOSIS — H26493 Other secondary cataract, bilateral: Secondary | ICD-10-CM | POA: Diagnosis not present

## 2014-01-08 DIAGNOSIS — H40053 Ocular hypertension, bilateral: Secondary | ICD-10-CM | POA: Diagnosis not present

## 2014-01-08 DIAGNOSIS — H04123 Dry eye syndrome of bilateral lacrimal glands: Secondary | ICD-10-CM | POA: Diagnosis not present

## 2014-01-08 DIAGNOSIS — H02831 Dermatochalasis of right upper eyelid: Secondary | ICD-10-CM | POA: Diagnosis not present

## 2014-01-08 DIAGNOSIS — H02834 Dermatochalasis of left upper eyelid: Secondary | ICD-10-CM | POA: Diagnosis not present

## 2014-01-08 DIAGNOSIS — H10413 Chronic giant papillary conjunctivitis, bilateral: Secondary | ICD-10-CM | POA: Diagnosis not present

## 2014-01-08 DIAGNOSIS — Z961 Presence of intraocular lens: Secondary | ICD-10-CM | POA: Diagnosis not present

## 2014-01-09 ENCOUNTER — Encounter: Payer: Self-pay | Admitting: Family Medicine

## 2014-01-09 ENCOUNTER — Ambulatory Visit (INDEPENDENT_AMBULATORY_CARE_PROVIDER_SITE_OTHER): Payer: Medicare Other | Admitting: Family Medicine

## 2014-01-09 VITALS — BP 118/78 | Ht 60.0 in | Wt 168.0 lb

## 2014-01-09 DIAGNOSIS — M8580 Other specified disorders of bone density and structure, unspecified site: Secondary | ICD-10-CM

## 2014-01-09 DIAGNOSIS — G44319 Acute post-traumatic headache, not intractable: Secondary | ICD-10-CM

## 2014-01-09 DIAGNOSIS — I749 Embolism and thrombosis of unspecified artery: Secondary | ICD-10-CM | POA: Diagnosis not present

## 2014-01-09 MED ORDER — TOPIRAMATE 50 MG PO TABS: 50.0000 mg | ORAL_TABLET | Freq: Every day | ORAL | Status: DC

## 2014-01-09 NOTE — Progress Notes (Signed)
   Subjective:    Patient ID: Jacqueline Morgan, female    DOB: [birth year], 73 y.o.   MRN: [id number]  HPI Patient states she been having headaches for the last couple of months or so. Also having dizziness & lost of balance which has been going on quite awhile.  Having headaches, pain near previous injury, radiates across scalp.  Headaches are everyday starts near 4 pm,has nasusea  l arm weak and tingles Usually takes tylenol helps some Occasionally Ha in the am  Saw eye doctor with good results this week  Balance poor, staggers at times, one fall out of truck Cognitive- some forgetfulness. Feels dizzy at times even with sitting still Sometimes difficult to speak/  Had hx of migraines in youth.  Review of Systems See above and below. Denies fever denies vomiting positive for ataxia weakness tremor nausea headaches    Objective:   Physical Exam  Finger to nose wave burst. Romberg is positive. EOMI normal. Strength bilateral normal. Hyperreflexia in both arms and legs. Lungs clear heart rate is controlled. 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 This patient is on blood thinners. An initial CT scan did not show any bleed. This patient has had increased tremors. Frontal headaches that cause nausea. Occur every day whereas in the past but did not do this. She states she is waking up on some days with a headache. She denies vomiting she denies double vision she states significant ataxia she has had several times where she is stumbled and one other time that she has fallen. Because of all of these issues this patient needs MRI of her brain. She will probably also need physical therapy for ataxia training and possibly need neurology consultation. We will try Topamax to see if that will help  with the headache. She will continue her current medications except for Relafen. She should not be taking this being on a blood thinner.

## 2014-01-14 ENCOUNTER — Ambulatory Visit (HOSPITAL_COMMUNITY)
Admission: RE | Admit: 2014-01-14 | Discharge: 2014-01-14 | Disposition: A | Discharge status: Home / Self Care | Payer: Medicare Other | Source: Ambulatory Visit | Attending: Family Medicine | Admitting: Family Medicine

## 2014-01-14 DIAGNOSIS — G44319 Acute post-traumatic headache, not intractable: Secondary | ICD-10-CM | POA: Diagnosis not present

## 2014-01-14 DIAGNOSIS — I10 Essential (primary) hypertension: Secondary | ICD-10-CM | POA: Insufficient documentation

## 2014-01-14 DIAGNOSIS — R51 Headache: Secondary | ICD-10-CM | POA: Diagnosis not present

## 2014-01-14 DIAGNOSIS — E785 Hyperlipidemia, unspecified: Secondary | ICD-10-CM | POA: Diagnosis not present

## 2014-01-14 DIAGNOSIS — R55 Syncope and collapse: Secondary | ICD-10-CM | POA: Diagnosis not present

## 2014-01-16 ENCOUNTER — Ambulatory Visit (HOSPITAL_COMMUNITY)
Admission: RE | Admit: 2014-01-16 | Discharge: 2014-01-16 | Disposition: A | Discharge status: Home / Self Care | Payer: Medicare Other | Source: Ambulatory Visit | Attending: Family Medicine | Admitting: Family Medicine

## 2014-01-16 DIAGNOSIS — M8580 Other specified disorders of bone density and structure, unspecified site: Secondary | ICD-10-CM | POA: Diagnosis present

## 2014-01-16 DIAGNOSIS — M859 Disorder of bone density and structure, unspecified: Secondary | ICD-10-CM | POA: Insufficient documentation

## 2014-01-16 DIAGNOSIS — Z780 Asymptomatic menopausal state: Secondary | ICD-10-CM | POA: Diagnosis not present

## 2014-01-16 DIAGNOSIS — M810 Age-related osteoporosis without current pathological fracture: Secondary | ICD-10-CM | POA: Diagnosis not present

## 2014-01-31 ENCOUNTER — Encounter: Payer: Self-pay | Admitting: Family Medicine

## 2014-01-31 ENCOUNTER — Ambulatory Visit (INDEPENDENT_AMBULATORY_CARE_PROVIDER_SITE_OTHER): Payer: Medicare Other | Admitting: Family Medicine

## 2014-01-31 VITALS — BP 118/72 | Ht 60.0 in | Wt 168.0 lb

## 2014-01-31 DIAGNOSIS — G44319 Acute post-traumatic headache, not intractable: Secondary | ICD-10-CM | POA: Diagnosis not present

## 2014-01-31 DIAGNOSIS — I749 Embolism and thrombosis of unspecified artery: Secondary | ICD-10-CM | POA: Diagnosis not present

## 2014-01-31 NOTE — Patient Instructions (Signed)
Please consider pneumonia vaccine

## 2014-01-31 NOTE — Progress Notes (Signed)
   Subjective:    Patient ID: Jacqueline Morgan, female    DOB: 1940-06-02, 73 y.o.   MRN: [id number]  HPI Patient arrives for a follow up on headaches. Patient said she has not had a headache since last visit.i. Patient states that she never had to get Topamax filled she relates that her headaches have gotten better. Have gone away. In addition to this she states her balance is doing better. No more falls no double vision no vomiting does not wake her up at night. Please see previous note.   Review of Systems  Constitutional: Negative for activity change, appetite change and fatigue.  Respiratory: Negative for cough, choking and chest tightness.   Cardiovascular: Negative for chest pain and leg swelling.  Endocrine: Negative for polydipsia and polyphagia.  Genitourinary: Negative for frequency.  Neurological: Negative for weakness and headaches.  Psychiatric/Behavioral: Negative for confusion.       Objective:   Physical Exam  Vitals reviewed. Constitutional: She appears well-nourished. No distress.  Cardiovascular: Normal rate, regular rhythm and normal heart sounds.   No murmur heard. Pulmonary/Chest: Effort normal and breath sounds normal. No respiratory distress.  Musculoskeletal: She exhibits no edema.  Lymphadenopathy:    She has no cervical adenopathy.  Neurological: She is alert. She exhibits normal muscle tone.  Psychiatric: Her behavior is normal.  balance good   She has some small lumps on the forearm it is hard to discern what is causing these does not appear to be any type of infection No ataxia detected on walking    Assessment & Plan:  Lumps on forearm if these persist or get worse may need dermatology consultation  Coumadin via cardiology-cardiology is managing this she has been warned regarding signs of bleeding in need to follow-up if problems  Headache resolved follow-up if problems  Recheck 6 months., Patient defers on flu vaccine

## 2014-02-17 ENCOUNTER — Ambulatory Visit (INDEPENDENT_AMBULATORY_CARE_PROVIDER_SITE_OTHER): Payer: Medicare Other | Admitting: *Deleted

## 2014-02-17 DIAGNOSIS — Z7901 Long term (current) use of anticoagulants: Secondary | ICD-10-CM

## 2014-02-17 DIAGNOSIS — I82622 Acute embolism and thrombosis of deep veins of left upper extremity: Secondary | ICD-10-CM | POA: Diagnosis not present

## 2014-02-17 DIAGNOSIS — I82629 Acute embolism and thrombosis of deep veins of unspecified upper extremity: Secondary | ICD-10-CM | POA: Diagnosis not present

## 2014-02-17 DIAGNOSIS — Z5181 Encounter for therapeutic drug level monitoring: Secondary | ICD-10-CM | POA: Diagnosis not present

## 2014-02-17 LAB — POCT INR: INR: 2.4

## 2014-03-31 ENCOUNTER — Ambulatory Visit (INDEPENDENT_AMBULATORY_CARE_PROVIDER_SITE_OTHER): Payer: Medicare Other | Admitting: *Deleted

## 2014-03-31 DIAGNOSIS — Z7901 Long term (current) use of anticoagulants: Secondary | ICD-10-CM | POA: Diagnosis not present

## 2014-03-31 DIAGNOSIS — I82622 Acute embolism and thrombosis of deep veins of left upper extremity: Secondary | ICD-10-CM

## 2014-03-31 DIAGNOSIS — I82629 Acute embolism and thrombosis of deep veins of unspecified upper extremity: Secondary | ICD-10-CM

## 2014-03-31 DIAGNOSIS — Z5181 Encounter for therapeutic drug level monitoring: Secondary | ICD-10-CM | POA: Diagnosis not present

## 2014-03-31 LAB — POCT INR: INR: 1.8

## 2014-04-07 ENCOUNTER — Other Ambulatory Visit: Payer: Self-pay | Admitting: Family Medicine

## 2014-04-07 NOTE — Telephone Encounter (Signed)
May refill this +3 additional refills 

## 2014-04-08 ENCOUNTER — Other Ambulatory Visit: Payer: Self-pay | Admitting: *Deleted

## 2014-04-08 ENCOUNTER — Other Ambulatory Visit: Payer: Self-pay

## 2014-04-08 MED ORDER — METOPROLOL SUCCINATE ER 50 MG PO TB24: 50.0000 mg | ORAL_TABLET | Freq: Every day | ORAL | Status: DC

## 2014-04-08 NOTE — Telephone Encounter (Signed)
Refill done.  

## 2014-04-08 NOTE — Telephone Encounter (Signed)
Refill complete 

## 2014-04-08 NOTE — Telephone Encounter (Signed)
Pt needs metoprolol called in to mail order. It was called in to [facility]

## 2014-04-09 ENCOUNTER — Other Ambulatory Visit: Payer: Self-pay | Admitting: *Deleted

## 2014-04-09 MED ORDER — PAROXETINE HCL 30 MG PO TABS: 30.0000 mg | ORAL_TABLET | Freq: Every day | ORAL | Status: DC

## 2014-04-10 ENCOUNTER — Other Ambulatory Visit: Payer: Self-pay | Admitting: Cardiology

## 2014-04-10 MED ORDER — METOPROLOL SUCCINATE ER 50 MG PO TB24: 50.0000 mg | ORAL_TABLET | Freq: Every day | ORAL | Status: DC

## 2014-04-10 NOTE — Telephone Encounter (Signed)
Please see refill bin / [facility]  °

## 2014-04-12 ENCOUNTER — Other Ambulatory Visit: Payer: Self-pay | Admitting: Nurse Practitioner

## 2014-04-12 MED ORDER — PANTOPRAZOLE SODIUM 40 MG PO TBEC: 40.0000 mg | DELAYED_RELEASE_TABLET | Freq: Two times a day (BID) | ORAL | Status: DC

## 2014-04-21 ENCOUNTER — Ambulatory Visit (INDEPENDENT_AMBULATORY_CARE_PROVIDER_SITE_OTHER): Payer: Medicare Other | Admitting: *Deleted

## 2014-04-21 ENCOUNTER — Other Ambulatory Visit: Payer: Self-pay | Admitting: *Deleted

## 2014-04-21 DIAGNOSIS — I82629 Acute embolism and thrombosis of deep veins of unspecified upper extremity: Secondary | ICD-10-CM | POA: Diagnosis not present

## 2014-04-21 DIAGNOSIS — Z5181 Encounter for therapeutic drug level monitoring: Secondary | ICD-10-CM

## 2014-04-21 DIAGNOSIS — I82622 Acute embolism and thrombosis of deep veins of left upper extremity: Secondary | ICD-10-CM

## 2014-04-21 DIAGNOSIS — Z7901 Long term (current) use of anticoagulants: Secondary | ICD-10-CM

## 2014-04-21 LAB — POCT INR: INR: 2.3

## 2014-04-21 MED ORDER — PANTOPRAZOLE SODIUM 40 MG PO TBEC
40.0000 mg | DELAYED_RELEASE_TABLET | Freq: Two times a day (BID) | ORAL | Status: DC
Start: 2014-04-21 — End: 2015-10-31

## 2014-05-20 ENCOUNTER — Telehealth: Payer: Self-pay | Admitting: Cardiology

## 2014-05-20 MED ORDER — WARFARIN SODIUM 5 MG PO TABS: ORAL_TABLET | ORAL | Status: DC

## 2014-05-20 NOTE — Telephone Encounter (Signed)
Patient needs RX for Warfarin sent to [facility] / [facility]

## 2014-05-21 ENCOUNTER — Telehealth: Payer: Self-pay | Admitting: *Deleted

## 2014-05-21 MED ORDER — WARFARIN SODIUM 5 MG PO TABS: ORAL_TABLET | ORAL | Status: DC

## 2014-05-21 NOTE — Telephone Encounter (Signed)
warfarin needs called in to [facility] not [facility] pt is out. Was called in yesterday to [facility]

## 2014-05-26 ENCOUNTER — Ambulatory Visit (INDEPENDENT_AMBULATORY_CARE_PROVIDER_SITE_OTHER): Payer: Medicare Other | Admitting: *Deleted

## 2014-05-26 DIAGNOSIS — I82622 Acute embolism and thrombosis of deep veins of left upper extremity: Secondary | ICD-10-CM | POA: Diagnosis not present

## 2014-05-26 DIAGNOSIS — Z5181 Encounter for therapeutic drug level monitoring: Secondary | ICD-10-CM

## 2014-05-26 DIAGNOSIS — I82629 Acute embolism and thrombosis of deep veins of unspecified upper extremity: Secondary | ICD-10-CM | POA: Diagnosis not present

## 2014-05-26 DIAGNOSIS — Z7901 Long term (current) use of anticoagulants: Secondary | ICD-10-CM

## 2014-05-26 LAB — POCT INR: INR: 2.5

## 2014-06-06 ENCOUNTER — Telehealth: Payer: Self-pay | Admitting: *Deleted

## 2014-06-06 ENCOUNTER — Other Ambulatory Visit: Payer: Self-pay | Admitting: *Deleted

## 2014-06-06 DIAGNOSIS — E041 Nontoxic single thyroid nodule: Secondary | ICD-10-CM

## 2014-06-06 NOTE — Telephone Encounter (Signed)
Pt in [facility] file for repeat thyroid ultrasound. [place] schedule aph march 9th 2 pm. Pt notified of appt.

## 2014-06-09 ENCOUNTER — Encounter: Payer: Self-pay | Admitting: Cardiology

## 2014-06-09 ENCOUNTER — Ambulatory Visit (INDEPENDENT_AMBULATORY_CARE_PROVIDER_SITE_OTHER): Payer: Medicare Other | Admitting: Cardiology

## 2014-06-09 VITALS — BP 142/76 | HR 87 | Ht 60.0 in | Wt 169.6 lb

## 2014-06-09 DIAGNOSIS — R002 Palpitations: Secondary | ICD-10-CM

## 2014-06-09 DIAGNOSIS — Z7901 Long term (current) use of anticoagulants: Secondary | ICD-10-CM

## 2014-06-09 NOTE — Progress Notes (Signed)
Clinical Summary Jacqueline Morgan is a 74 y.o.female seen today for follow up of the following medical problems.   1. 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  - underwent left arm thromboembolectomy 12/19/12 by Jacqueline Morgan  - echo was negative for thrombus. There was some question of possible aortic arch atherosclerosis. Discharged on coumadin. - denies any bleeding issues on coumadin. Notes some occsaional pain in left arm with activity that is unchanged..  - has not been in favor of NOACs   2. Palpitations - can often feel heart racing - feels heart racing daily, often brought on by drinking liquids. Can last for a few minutes - does not drink coffee, drinks x 1 Jacqueline Morgan, decaf tea, no EtOH.  - previous event monitor shows symptoms correlate with NSR.    Past Medical History  Diagnosis Date  . Hyperlipidemia   . Palpitations   . Anxiety   . Fibromyalgia   . GERD (gastroesophageal reflux disease)      Allergies  Allergen Reactions  . Codeine     REACTION: Adverse gastrointestinal effects     Current Outpatient Prescriptions  Medication Sig Dispense Refill  . acetaminophen (TYLENOL) 500 MG tablet Take 1,000 mg by mouth once as needed. For headache/pain    . calcium carbonate (OS-CAL) 600 MG TABS Take 600 mg by mouth daily.      [name] latanoprost (XALATAN) 0.005 % ophthalmic solution     . LORazepam (ATIVAN) 1 MG tablet TAKE ONE TABLET AT NOON, 1/2 AT SUPPER AND 1 AT BEDTIME. 75 tablet 3  . metoprolol succinate (TOPROL-XL) 50 MG 24 hr tablet Take 1 tablet (50 mg total) by mouth daily. 90 tablet 1  . ondansetron (ZOFRAN-ODT) 8 MG disintegrating tablet Take 1 tablet (8 mg total) by mouth every 8 (eight) hours as needed for nausea or vomiting. 30 tablet 0  . pantoprazole (PROTONIX) 40 MG tablet Take 1 tablet (40 mg total) by mouth 2 (two) times daily. 180 tablet 1  . PARoxetine (PAXIL) 30 MG  tablet Take 1 tablet (30 mg total) by mouth at bedtime. 90 tablet 1  . sucralfate (CARAFATE) 1 G tablet TAKE 1 TABLET BY MOUTH BEFORE MEALS AND AT BEDTIME. 120 tablet 1  . warfarin (COUMADIN) 5 MG tablet Take 1/2 tablet daily except 1 tablet on Tuesdays and Saturdays 30 tablet 6   No current facility-administered medications for this visit.     Past Surgical History  Procedure Laterality Date  . Embolectomy Left 12/19/2012    Procedure: EMBOLECTOMY OF LEFT BRACHIAL ARTERY;  Surgeon: Jacqueline Greentree, MD;  Location: [facility];  Service: Vascular;  Laterality: Left;  [name] Eye surgery       Allergies  Allergen Reactions  . Codeine     REACTION: Adverse gastrointestinal effects      Family History  Problem Relation Age of Onset  . Cancer Mother     ovarian  . Heart disease Mother      Social History Jacqueline Morgan reports that she has never smoked. She has never used smokeless tobacco. Jacqueline Morgan reports that she does not drink alcohol.   Review of Systems CONSTITUTIONAL: No weight loss, fever, chills, weakness or fatigue.  HEENT: Eyes: No visual loss, blurred vision, double vision or yellow sclerae.No hearing loss, sneezing, congestion, runny nose or sore throat.  SKIN: No rash or itching.  CARDIOVASCULAR: per HPI  RESPIRATORY: No shortness of breath, cough or sputum.  GASTROINTESTINAL: No anorexia, nausea, vomiting or diarrhea. No abdominal pain or blood.  GENITOURINARY: No burning on urination, no polyuria NEUROLOGICAL: No headache, dizziness, syncope, paralysis, ataxia, numbness or tingling in the extremities. No change in bowel or bladder control.  MUSCULOSKELETAL: No muscle, back pain, joint pain or stiffness.  LYMPHATICS: No enlarged nodes. No history of splenectomy.  PSYCHIATRIC: No history of depression or anxiety.  ENDOCRINOLOGIC: No reports of sweating, cold or heat intolerance. No polyuria or polydipsia.  [name]   Physical Examination p 87 bp 142/76 Wt 169 lbs BMI 33 Gen:  resting comfortably, no acute distress HEENT: no scleral icterus, pupils equal round and reactive, no palptable cervical adenopathy,  CV: RRR, no m/r/g, no JVD, no carotid bruits Resp: Clear to auscultation bilaterally GI: abdomen is soft, non-tender, non-distended, normal bowel sounds, no hepatosplenomegaly MSK: extremities are warm, no edema.  Skin: warm, no rash Neuro:  no focal deficits Psych: appropriate affect   Diagnostic Studies 05/2013 21 day Event monitor No arrythmias    Assessment and Plan  1. Left brachial artery occlusion  - secondary to thromboembolism, unclear source of the embolism (cardiac vs aortic atheroscerlosis)  - recent event monitor showed no evidence of afib, prior echo without clot.  - will continue coumadin. She is not interested in NOACs.   2. Palpitations - nevative event monitor 05/2013. Hx of anxiety, visibly anxious and tremulous during visit today. Reports she is prone to panic attacks. Suspect this is anxiety related. - discussed trying higher doses of Toprol XL to see if any benefit however she is concerned about side effects. Will continue anxiety management by pcp.     F/u 1 year  Jacqueline Morgan, M.D.

## 2014-06-09 NOTE — Patient Instructions (Signed)
Your physician wants you to follow-up in: 1 year with DrBranch You will receive a reminder letter in the mail two months in advance. If you don't receive a letter, please call our office to schedule the follow-up appointment.     Your physician recommends that you continue on your current medications as directed. Please refer to the Current Medication list given to you today.      Thank you for choosing [facility] !        

## 2014-06-11 ENCOUNTER — Ambulatory Visit (HOSPITAL_COMMUNITY)
Admission: RE | Admit: 2014-06-11 | Discharge: 2014-06-11 | Disposition: A | Discharge status: Home / Self Care | Payer: Medicare Other | Source: Ambulatory Visit | Attending: Family Medicine | Admitting: Family Medicine

## 2014-06-11 DIAGNOSIS — E041 Nontoxic single thyroid nodule: Secondary | ICD-10-CM | POA: Diagnosis not present

## 2014-06-23 ENCOUNTER — Ambulatory Visit (INDEPENDENT_AMBULATORY_CARE_PROVIDER_SITE_OTHER): Payer: Medicare Other | Admitting: *Deleted

## 2014-06-23 DIAGNOSIS — Z7901 Long term (current) use of anticoagulants: Secondary | ICD-10-CM

## 2014-06-23 DIAGNOSIS — Z5181 Encounter for therapeutic drug level monitoring: Secondary | ICD-10-CM

## 2014-06-23 DIAGNOSIS — I82622 Acute embolism and thrombosis of deep veins of left upper extremity: Secondary | ICD-10-CM

## 2014-06-23 DIAGNOSIS — I82629 Acute embolism and thrombosis of deep veins of unspecified upper extremity: Secondary | ICD-10-CM | POA: Diagnosis not present

## 2014-06-23 LAB — POCT INR: INR: 2.4

## 2014-06-24 ENCOUNTER — Ambulatory Visit (INDEPENDENT_AMBULATORY_CARE_PROVIDER_SITE_OTHER): Payer: Medicare Other | Admitting: Family Medicine

## 2014-06-24 VITALS — BP 118/82 | Ht 60.0 in | Wt 171.0 lb

## 2014-06-24 DIAGNOSIS — R739 Hyperglycemia, unspecified: Secondary | ICD-10-CM | POA: Diagnosis not present

## 2014-06-24 DIAGNOSIS — K219 Gastro-esophageal reflux disease without esophagitis: Secondary | ICD-10-CM | POA: Diagnosis not present

## 2014-06-24 DIAGNOSIS — D6489 Other specified anemias: Secondary | ICD-10-CM | POA: Diagnosis not present

## 2014-06-24 DIAGNOSIS — R05 Cough: Secondary | ICD-10-CM | POA: Diagnosis not present

## 2014-06-24 DIAGNOSIS — R059 Cough, unspecified: Secondary | ICD-10-CM

## 2014-06-24 DIAGNOSIS — E042 Nontoxic multinodular goiter: Secondary | ICD-10-CM | POA: Diagnosis not present

## 2014-06-24 DIAGNOSIS — E785 Hyperlipidemia, unspecified: Secondary | ICD-10-CM

## 2014-06-24 NOTE — Patient Instructions (Signed)
Wear velcro brace on left wrist at bedtime  Recheck hand in 4 to 6 weeks  Do your labs and chest xray

## 2014-06-24 NOTE — Progress Notes (Signed)
   Subjective:    Patient ID: Jacqueline Morgan, female    DOB: 1940-11-14, 74 y.o.   MRN: [id number]  HPI  Patient arrives to discuss results of thyroid ultrasound.please see previous note. Ultrasound does show slight worsening of thyroid nodules. No obvious cancer. Patient deconditioned does not exercise on a regular basis does not do much to help herself. We talked at length about the importance of eating healthy and stay physically active.   Patient would like chest xray due to chronic cough. Patient denies being depressed but does relate being anxious. Patient does not feel any area in her neck that's bothering her. Review of Systems Patient relates some numbness in the left hand denies any chest pressure relate some intermittent cough relates fatigue tiredness with activity.    Objective:   Physical Exam Neck no masses felt lungs are clear heart regular no arrhythmia  extremities no edema No weakness in the hands pulses normal color normal      Assessment & Plan:  Chronic cough-has history of reflux. Could be related to that. Will get a chest x-ray. Patient states that time has phlegm no antibiotics indicated currently await the result the x-ray  Reflux apparently stable not having symptoms takes medication on a daily basis  Hyperlipidemia paced states statins caused her to feel bad she does not one take statins but she agrees to get lab work  Fatigue tiredness on Coumadin will check CBC to rule out anemia.  Thyroid nodules even though 1 area slightly enlarge radiologist feels that it would be reasonable to repeat tests in 6 months time patient does not one ago have needle biopsy currently with been on Coumadin may need to do this if ultrasound changes. Recommend repeat ultrasound in 6 months time patient in follow-up in 6 months  Patient significant deconditioning she's gone through depression and anxiety she has some aspects of chronic anxiety worsened by the loss of her  husband she is trying to become more active.  Probable carpal tunnel left wrist wear braces at nighttime It should be noted that greater than 25 minutes was spent handling these multiple issues and in discussion and answering of questions. Follow-up  6 weeks

## 2014-06-25 ENCOUNTER — Ambulatory Visit (HOSPITAL_COMMUNITY)
Admission: RE | Admit: 2014-06-25 | Discharge: 2014-06-25 | Disposition: A | Discharge status: Home / Self Care | Payer: Medicare Other | Source: Ambulatory Visit | Attending: Family Medicine | Admitting: Family Medicine

## 2014-06-25 DIAGNOSIS — E042 Nontoxic multinodular goiter: Secondary | ICD-10-CM | POA: Diagnosis not present

## 2014-06-25 DIAGNOSIS — D6489 Other specified anemias: Secondary | ICD-10-CM | POA: Diagnosis not present

## 2014-06-25 DIAGNOSIS — R7309 Other abnormal glucose: Secondary | ICD-10-CM | POA: Diagnosis not present

## 2014-06-25 DIAGNOSIS — R0602 Shortness of breath: Secondary | ICD-10-CM | POA: Diagnosis not present

## 2014-06-25 DIAGNOSIS — R05 Cough: Secondary | ICD-10-CM | POA: Insufficient documentation

## 2014-06-25 DIAGNOSIS — E785 Hyperlipidemia, unspecified: Secondary | ICD-10-CM | POA: Diagnosis not present

## 2014-06-25 DIAGNOSIS — R739 Hyperglycemia, unspecified: Secondary | ICD-10-CM | POA: Diagnosis not present

## 2014-06-26 LAB — BASIC METABOLIC PANEL
BUN/Creatinine Ratio: 11 (ref 11–26)
BUN: 9 mg/dL (ref 8–27)
CO2: 21 mmol/L (ref 18–29)
CREATININE: 0.82 mg/dL (ref 0.57–1.00)
Calcium: 9.4 mg/dL (ref 8.7–10.3)
Chloride: 99 mmol/L (ref 97–108)
GFR calc Af Amer: 82 mL/min/{1.73_m2} (ref >59)
GFR, EST NON AFRICAN AMERICAN: 71 mL/min/{1.73_m2} (ref >59)
GLUCOSE: 109 mg/dL — AB (ref 65–99)
Potassium: 4.4 mmol/L (ref 3.5–5.2)
Sodium: 138 mmol/L (ref 134–144)

## 2014-06-26 LAB — LIPID PANEL
CHOL/HDL RATIO: 8.6 ratio — AB (ref 0.0–4.4)
Cholesterol, Total: 326 mg/dL — ABNORMAL HIGH (ref 100–199)
HDL: 38 mg/dL — AB (ref >39)
LDL CALC: 223 mg/dL — AB (ref 0–99)
TRIGLYCERIDES: 327 mg/dL — AB (ref 0–149)
VLDL Cholesterol Cal: 65 mg/dL — ABNORMAL HIGH (ref 5–40)

## 2014-06-26 LAB — HEPATIC FUNCTION PANEL
ALT: 18 IU/L (ref 0–32)
AST: 25 IU/L (ref 0–40)
Albumin: 3.9 g/dL (ref 3.5–4.8)
Alkaline Phosphatase: 107 IU/L (ref 39–117)
BILIRUBIN, DIRECT: 0.1 mg/dL (ref 0.00–0.40)
Bilirubin Total: 0.4 mg/dL (ref 0.0–1.2)
Total Protein: 7.1 g/dL (ref 6.0–8.5)

## 2014-06-26 LAB — CBC WITH DIFFERENTIAL/PLATELET
BASOS ABS: 0.1 10*3/uL (ref 0.0–0.2)
Basos: 1 %
EOS ABS: 0.1 10*3/uL (ref 0.0–0.4)
Eos: 2 %
HCT: 42.7 % (ref 34.0–46.6)
HEMOGLOBIN: 14.2 g/dL (ref 11.1–15.9)
IMMATURE GRANULOCYTES: 0 %
Immature Grans (Abs): 0 10*3/uL (ref 0.0–0.1)
Lymphocytes Absolute: 2.5 10*3/uL (ref 0.7–3.1)
Lymphs: 42 %
MCH: 29.3 pg (ref 26.6–33.0)
MCHC: 33.3 g/dL (ref 31.5–35.7)
MCV: 88 fL (ref 79–97)
Monocytes Absolute: 0.4 10*3/uL (ref 0.1–0.9)
Monocytes: 7 %
NEUTROS ABS: 2.9 10*3/uL (ref 1.4–7.0)
Neutrophils Relative %: 48 %
PLATELETS: 353 10*3/uL (ref 150–379)
RBC: 4.84 x10E6/uL (ref 3.77–5.28)
RDW: 14.9 % (ref 12.3–15.4)
WBC: 6 10*3/uL (ref 3.4–10.8)

## 2014-06-26 LAB — TSH: TSH: 1.62 u[IU]/mL (ref 0.450–4.500)

## 2014-06-26 LAB — T4, FREE: Free T4: 1.11 ng/dL (ref 0.82–1.77)

## 2014-06-30 NOTE — Progress Notes (Signed)
Pt said she is going to call us back with what option she wants to do. She said she wants to talk to her brother first.

## 2014-07-08 DIAGNOSIS — Z1231 Encounter for screening mammogram for malignant neoplasm of breast: Secondary | ICD-10-CM | POA: Diagnosis not present

## 2014-07-29 ENCOUNTER — Encounter: Payer: Self-pay | Admitting: Family Medicine

## 2014-08-04 ENCOUNTER — Ambulatory Visit (INDEPENDENT_AMBULATORY_CARE_PROVIDER_SITE_OTHER): Payer: Medicare Other | Admitting: *Deleted

## 2014-08-04 DIAGNOSIS — I82622 Acute embolism and thrombosis of deep veins of left upper extremity: Secondary | ICD-10-CM

## 2014-08-04 DIAGNOSIS — Z7901 Long term (current) use of anticoagulants: Secondary | ICD-10-CM

## 2014-08-04 DIAGNOSIS — I82629 Acute embolism and thrombosis of deep veins of unspecified upper extremity: Secondary | ICD-10-CM

## 2014-08-04 DIAGNOSIS — Z5181 Encounter for therapeutic drug level monitoring: Secondary | ICD-10-CM | POA: Diagnosis not present

## 2014-08-04 LAB — POCT INR: INR: 2.8

## 2014-08-07 ENCOUNTER — Ambulatory Visit (INDEPENDENT_AMBULATORY_CARE_PROVIDER_SITE_OTHER): Payer: Medicare Other | Admitting: Family Medicine

## 2014-08-07 ENCOUNTER — Encounter: Payer: Self-pay | Admitting: Family Medicine

## 2014-08-07 VITALS — BP 100/70 | Ht 60.0 in | Wt 169.2 lb

## 2014-08-07 DIAGNOSIS — E785 Hyperlipidemia, unspecified: Secondary | ICD-10-CM | POA: Diagnosis not present

## 2014-08-07 MED ORDER — ROSUVASTATIN CALCIUM 5 MG PO TABS: 5.0000 mg | ORAL_TABLET | Freq: Every day | ORAL | Status: DC

## 2014-08-07 MED ORDER — LORAZEPAM 1 MG PO TABS: ORAL_TABLET | ORAL | Status: DC

## 2014-08-07 NOTE — Progress Notes (Signed)
   Subjective:    Patient ID: Jacqueline Morgan, female    DOB: 1940-11-19, 74 y.o.   MRN: [id number]  Hyperlipidemia This is a chronic problem. The current episode started more than 1 year ago. The problem is uncontrolled. There are no known factors aggravating her hyperlipidemia. She is currently on no antihyperlipidemic treatment. The current treatment provides no improvement of lipids. Compliance problems include medication side effects.  There are no known risk factors for coronary artery disease.   Patient states that she has a lot of left arm pain. This has been present for awhile now.   Review of Systems Denies chest tightness pressure pain shortness of breath    Objective:   Physical Exam  Lungs clear heart regular pulse and arms are good no swelling noted left axilla. Nontender no masses felt  Patient faces at over 12% risk in the next 10 years of heart disease    Assessment & Plan:  Patient with intermittent pain into the left arm examination normal if worsens need further interventions. Circulation seems good.  Hyperlipidemia patient in the past has not tolerated statins well but it's not a concrete contraindication. I believe this patient would benefit from a low-dose statin she agrees to try Crestor 5 mg we will do it 3 days a week she will check her lab work in 6-8 weeks and have sent here  Follow-up 5 months.

## 2014-08-12 DIAGNOSIS — H02831 Dermatochalasis of right upper eyelid: Secondary | ICD-10-CM | POA: Diagnosis not present

## 2014-08-12 DIAGNOSIS — H40053 Ocular hypertension, bilateral: Secondary | ICD-10-CM | POA: Diagnosis not present

## 2014-08-12 DIAGNOSIS — Z961 Presence of intraocular lens: Secondary | ICD-10-CM | POA: Diagnosis not present

## 2014-08-12 DIAGNOSIS — H02834 Dermatochalasis of left upper eyelid: Secondary | ICD-10-CM | POA: Diagnosis not present

## 2014-08-12 DIAGNOSIS — H26493 Other secondary cataract, bilateral: Secondary | ICD-10-CM | POA: Diagnosis not present

## 2014-08-12 DIAGNOSIS — D2212 Melanocytic nevi of left eyelid, including canthus: Secondary | ICD-10-CM | POA: Diagnosis not present

## 2014-08-13 IMAGING — US US PELVIS COMPLETE
1 series · 13 of 25 positions shown · non-contrast
Comparison: None

CLINICAL DATA: Postmenopausal bleeding

EXAM:
TRANSABDOMINAL AND TRANSVAGINAL ULTRASOUND OF PELVIS
TECHNIQUE: Both transabdominal and transvaginal ultrasound examinations of the
pelvis were performed. Transabdominal technique was performed for
global imaging of the pelvis including uterus, ovaries, adnexal
regions, and pelvic cul-de-sac. It was necessary to proceed with
endovaginal exam following the transabdominal exam to visualize the
endometrium and ovaries. Suboptimal visualization of intrapelvic
contents due to bowel gas.

[Series 1: us pelvis complete · 0.18mm/px · 13 of 65 slices shown]
[im 1/65]
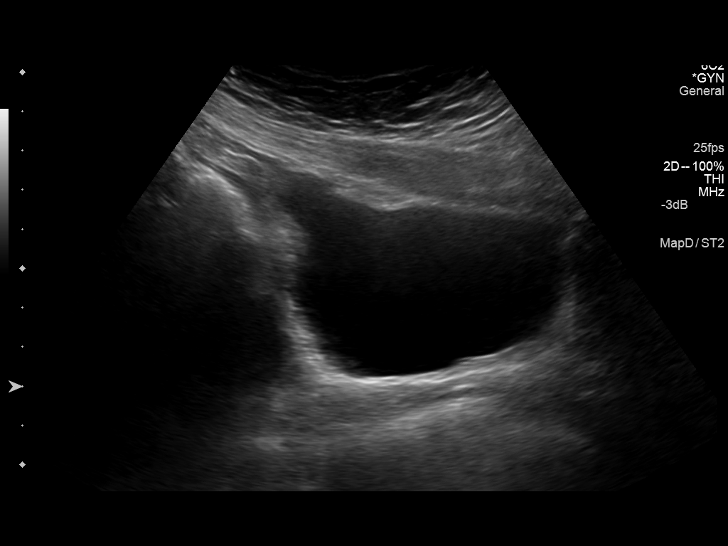
[im 6/65]
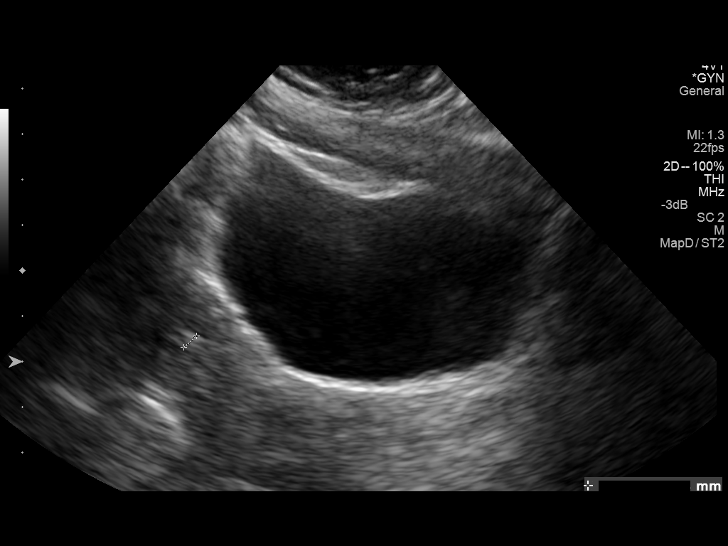
[im 11/65]
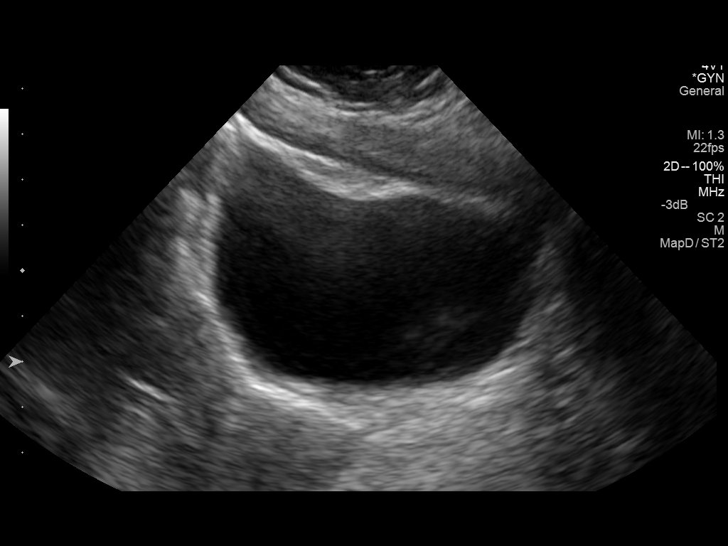
[im 17/65]
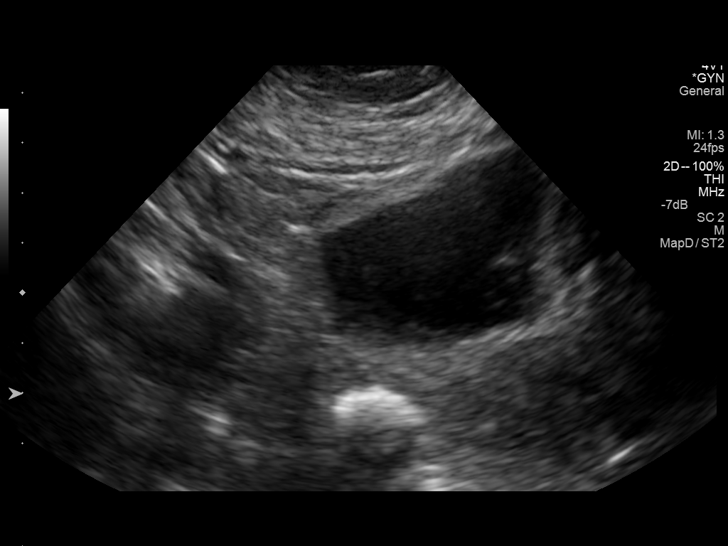
[im 22/65]
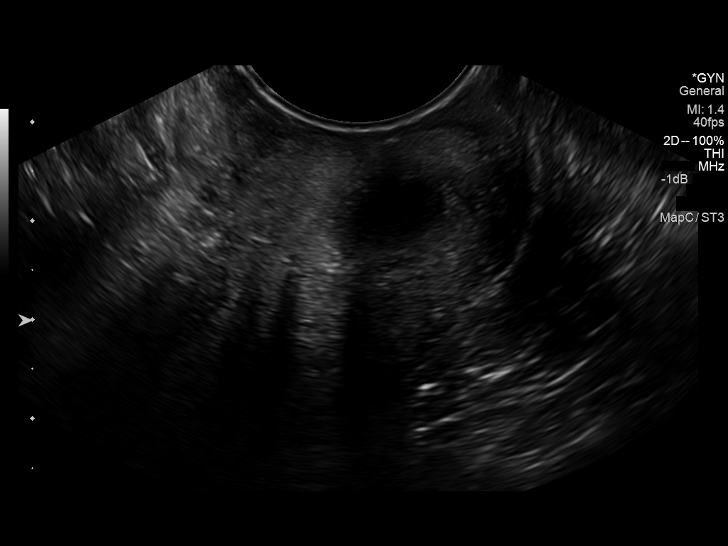
[im 27/65]
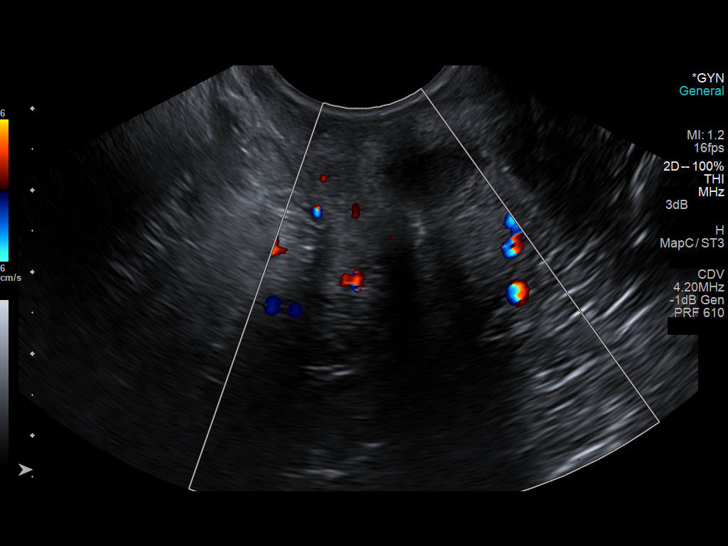
[im 33/65]
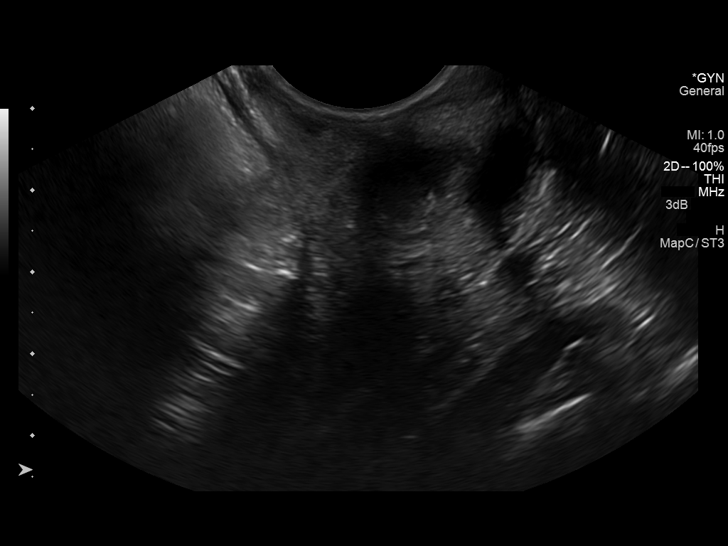
[im 38/65]
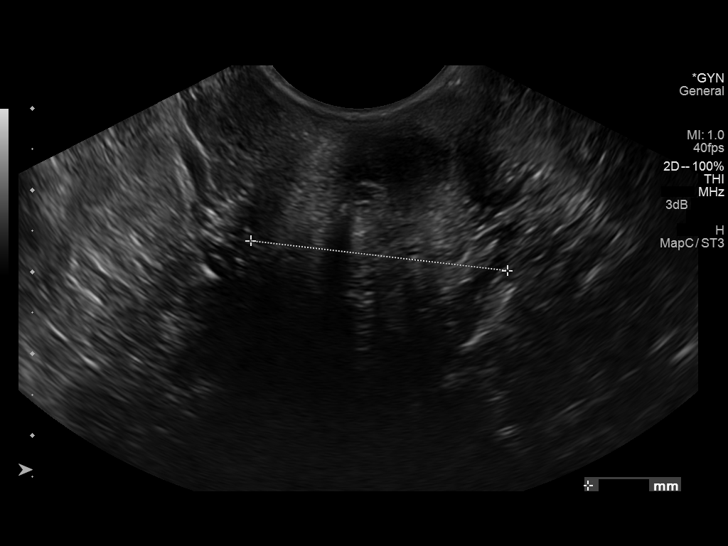
[im 43/65]
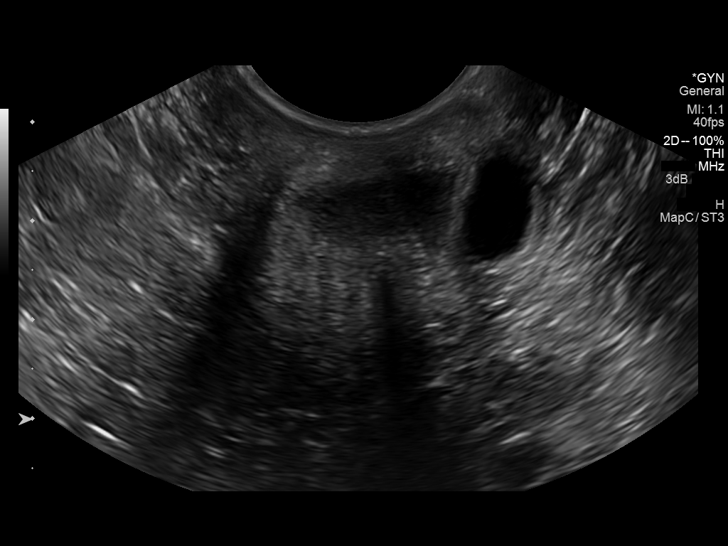
[im 49/65]
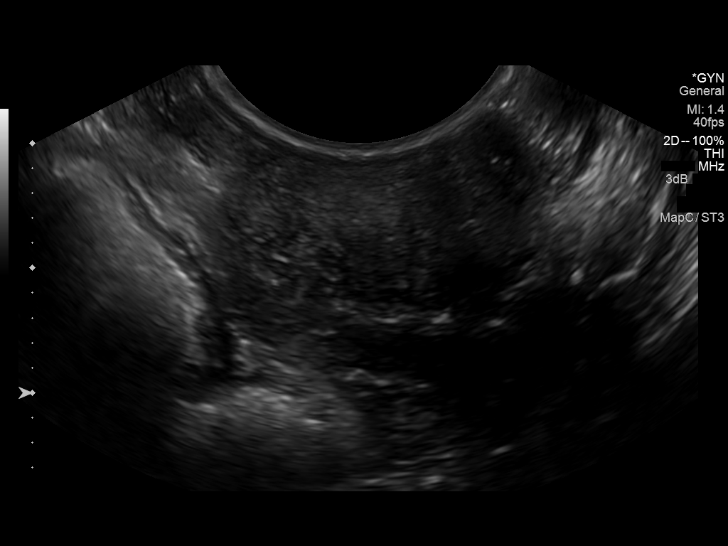
[im 54/65]
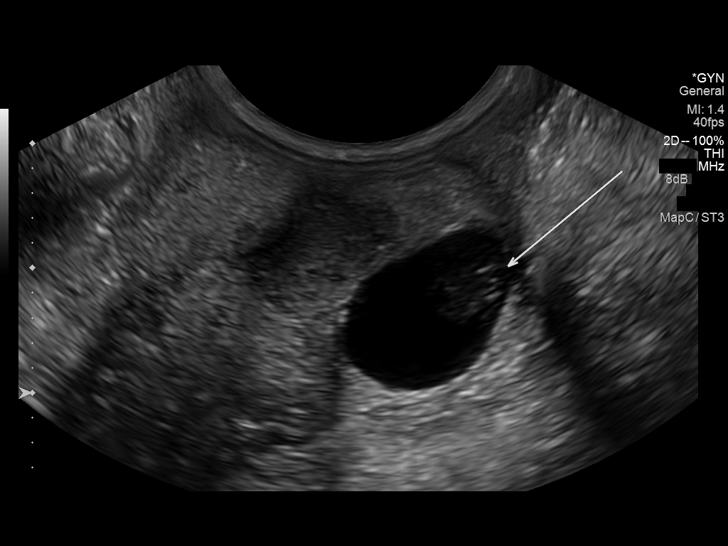
[im 59/65]
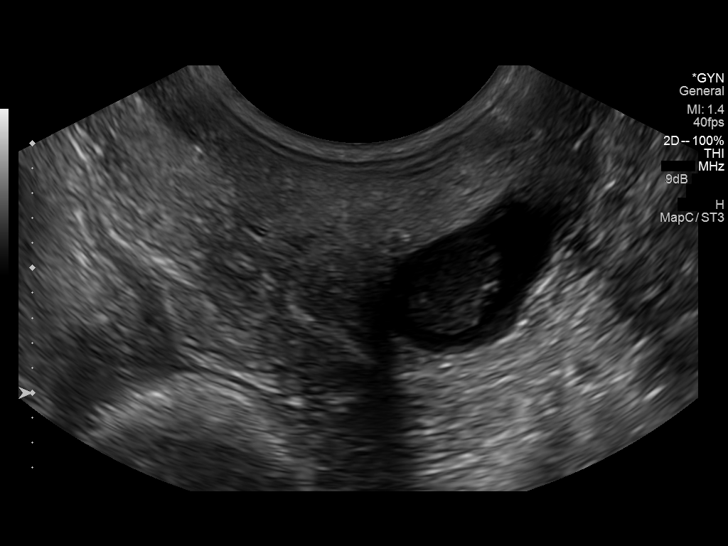
[im 65/65]
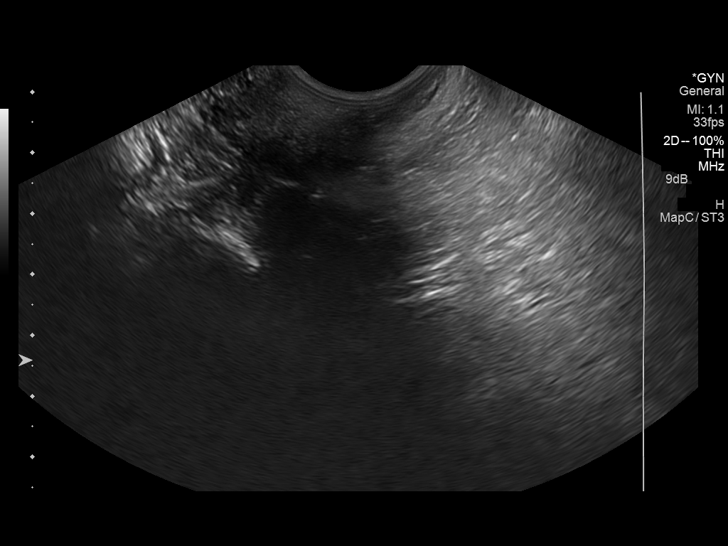

[13 of 25 positions shown; findings below may reference images not displayed]

FINDINGS: Uterus

Measurements: 5.9 x 2.3 x 2.8 cm. Grossly normal morphology without
mass.

Endometrium

Thickness: 4 mm thick. Complex hypoechoic material within the
endocervical canal, question represents a small a moderate amount of
blood.

Right ovary

Not visualized on either transabdominal or endovaginal imaging
question related to position, atrophy or obscuration by bowel.

Left ovary

Not visualized on either transabdominal or endovaginal imaging
question related to position, atrophy or obscuration by bowel.

Other findings

No free-fluid. Cystic structure measuring 1.6 x 1.1 x 1.2 cm
diameter identified to left lateral aspect of the cervix, containing
a small amount of debris, question Gartner duct cyst.
IMPRESSION: Blood within the endometrial/endocervical canal.

Question 1.6 cm diameter Gartner duct cyst adjacent to the left
lateral aspect of the cervix.

## 2014-08-21 DIAGNOSIS — D2312 Other benign neoplasm of skin of left eyelid, including canthus: Secondary | ICD-10-CM | POA: Diagnosis not present

## 2014-08-21 DIAGNOSIS — L821 Other seborrheic keratosis: Secondary | ICD-10-CM | POA: Diagnosis not present

## 2014-08-28 DIAGNOSIS — H26493 Other secondary cataract, bilateral: Secondary | ICD-10-CM | POA: Diagnosis not present

## 2014-09-04 DIAGNOSIS — H26491 Other secondary cataract, right eye: Secondary | ICD-10-CM | POA: Diagnosis not present

## 2014-09-15 ENCOUNTER — Ambulatory Visit (INDEPENDENT_AMBULATORY_CARE_PROVIDER_SITE_OTHER): Payer: Medicare Other | Admitting: *Deleted

## 2014-09-15 DIAGNOSIS — Z7901 Long term (current) use of anticoagulants: Secondary | ICD-10-CM | POA: Diagnosis not present

## 2014-09-15 DIAGNOSIS — Z5181 Encounter for therapeutic drug level monitoring: Secondary | ICD-10-CM

## 2014-09-15 DIAGNOSIS — I82629 Acute embolism and thrombosis of deep veins of unspecified upper extremity: Secondary | ICD-10-CM

## 2014-09-15 DIAGNOSIS — I82622 Acute embolism and thrombosis of deep veins of left upper extremity: Secondary | ICD-10-CM

## 2014-09-15 LAB — POCT INR: INR: 3.4

## 2014-09-29 ENCOUNTER — Other Ambulatory Visit: Payer: Self-pay

## 2014-10-13 ENCOUNTER — Ambulatory Visit (INDEPENDENT_AMBULATORY_CARE_PROVIDER_SITE_OTHER): Payer: Medicare Other | Admitting: *Deleted

## 2014-10-13 DIAGNOSIS — Z5181 Encounter for therapeutic drug level monitoring: Secondary | ICD-10-CM

## 2014-10-13 DIAGNOSIS — I82629 Acute embolism and thrombosis of deep veins of unspecified upper extremity: Secondary | ICD-10-CM

## 2014-10-13 DIAGNOSIS — I82622 Acute embolism and thrombosis of deep veins of left upper extremity: Secondary | ICD-10-CM

## 2014-10-13 DIAGNOSIS — Z7901 Long term (current) use of anticoagulants: Secondary | ICD-10-CM | POA: Diagnosis not present

## 2014-10-13 LAB — POCT INR: INR: 1.7

## 2014-10-22 ENCOUNTER — Other Ambulatory Visit: Payer: Self-pay | Admitting: Family Medicine

## 2014-10-27 ENCOUNTER — Ambulatory Visit (INDEPENDENT_AMBULATORY_CARE_PROVIDER_SITE_OTHER): Payer: Medicare Other | Admitting: *Deleted

## 2014-10-27 DIAGNOSIS — I82622 Acute embolism and thrombosis of deep veins of left upper extremity: Secondary | ICD-10-CM | POA: Diagnosis not present

## 2014-10-27 DIAGNOSIS — Z5181 Encounter for therapeutic drug level monitoring: Secondary | ICD-10-CM | POA: Diagnosis not present

## 2014-10-27 DIAGNOSIS — Z7901 Long term (current) use of anticoagulants: Secondary | ICD-10-CM

## 2014-10-27 LAB — POCT INR: INR: 2.4

## 2014-11-04 DIAGNOSIS — E785 Hyperlipidemia, unspecified: Secondary | ICD-10-CM | POA: Diagnosis not present

## 2014-11-05 LAB — HEPATIC FUNCTION PANEL
ALBUMIN: 3.9 g/dL (ref 3.5–4.8)
ALT: 16 IU/L (ref 0–32)
AST: 22 IU/L (ref 0–40)
Alkaline Phosphatase: 94 IU/L (ref 39–117)
BILIRUBIN, DIRECT: 0.09 mg/dL (ref 0.00–0.40)
Bilirubin Total: 0.3 mg/dL (ref 0.0–1.2)
Total Protein: 7.1 g/dL (ref 6.0–8.5)

## 2014-11-05 LAB — LIPID PANEL
Chol/HDL Ratio: 6.1 ratio units — ABNORMAL HIGH (ref 0.0–4.4)
Cholesterol, Total: 233 mg/dL — ABNORMAL HIGH (ref 100–199)
HDL: 38 mg/dL — ABNORMAL LOW (ref >39)
LDL Calculated: 135 mg/dL — ABNORMAL HIGH (ref 0–99)
TRIGLYCERIDES: 300 mg/dL — AB (ref 0–149)
VLDL CHOLESTEROL CAL: 60 mg/dL — AB (ref 5–40)

## 2014-11-06 ENCOUNTER — Other Ambulatory Visit: Payer: Self-pay | Admitting: Cardiology

## 2014-11-06 ENCOUNTER — Encounter: Payer: Self-pay | Admitting: Family Medicine

## 2014-11-06 NOTE — Telephone Encounter (Signed)
Unfortunately there are no easy answers. Although the medication certainly can be contributing to the joint pain the medication is reducing the risk of heart attacks and strokes. Nurse's-please communicate with the patient that I would like for her to try the medication just 3 days a week and see if she can tolerate that low dose. In 3 months we can recheck lab work. If over the next few weeks the discomfort is no better in the patient wants to stop the medicine she can. Unfortunately there are no other alternative medications at this time

## 2014-11-17 ENCOUNTER — Ambulatory Visit (INDEPENDENT_AMBULATORY_CARE_PROVIDER_SITE_OTHER): Payer: Medicare Other | Admitting: *Deleted

## 2014-11-17 DIAGNOSIS — Z5181 Encounter for therapeutic drug level monitoring: Secondary | ICD-10-CM

## 2014-11-17 DIAGNOSIS — I82629 Acute embolism and thrombosis of deep veins of unspecified upper extremity: Secondary | ICD-10-CM | POA: Diagnosis not present

## 2014-11-17 DIAGNOSIS — Z7901 Long term (current) use of anticoagulants: Secondary | ICD-10-CM

## 2014-11-17 DIAGNOSIS — I82622 Acute embolism and thrombosis of deep veins of left upper extremity: Secondary | ICD-10-CM | POA: Diagnosis not present

## 2014-11-17 LAB — POCT INR: INR: 2.8

## 2014-11-27 ENCOUNTER — Other Ambulatory Visit: Payer: Self-pay | Admitting: Family Medicine

## 2014-12-05 IMAGING — CT CT HEAD W/O CM
4 of 5 series · 14 of 47 positions shown, 15 images · non-contrast
Comparison: CT of the head performed 04/19/2011, and cervical spine
radiographs performed 03/20/2012

CLINICAL DATA: Status post fall. Right-sided scalp laceration,
weakness and syncope. Posterior neck pain.

EXAM:
CT HEAD WITHOUT CONTRAST
CT CERVICAL SPINE WITHOUT CONTRAST
TECHNIQUE: Multidetector CT imaging of the head and cervical spine was
performed following the standard protocol without intravenous
contrast. Multiplanar CT image reconstructions of the cervical spine
were also generated.

[Series 2: headseq 4.8 h37s · axial · 0.43mm/px · z∈[+30,+118]mm · 3 of 36 slices shown, 4 images]
[im 9/36  brain]
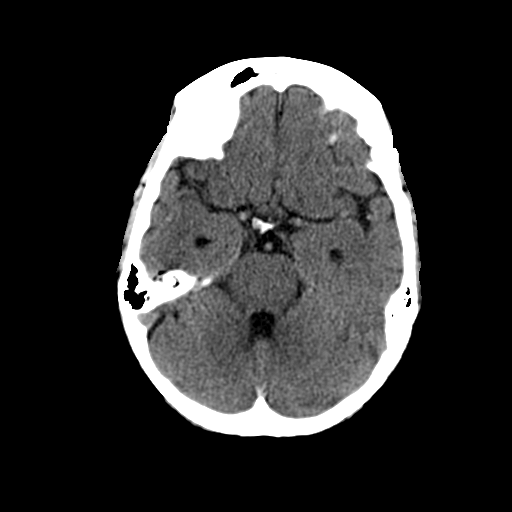
[im 9/36  bone]
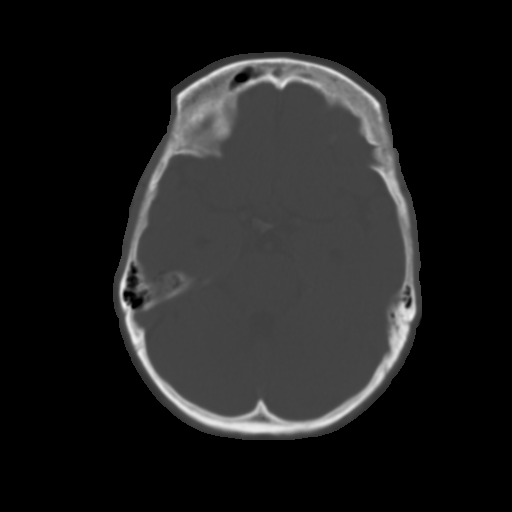
[im 18/36  brain]
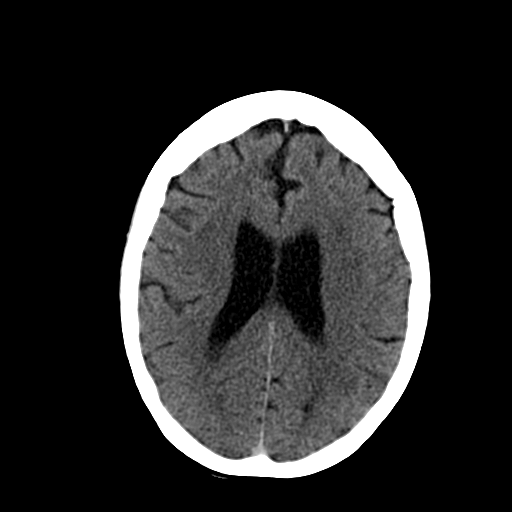
[im 27/36  brain]
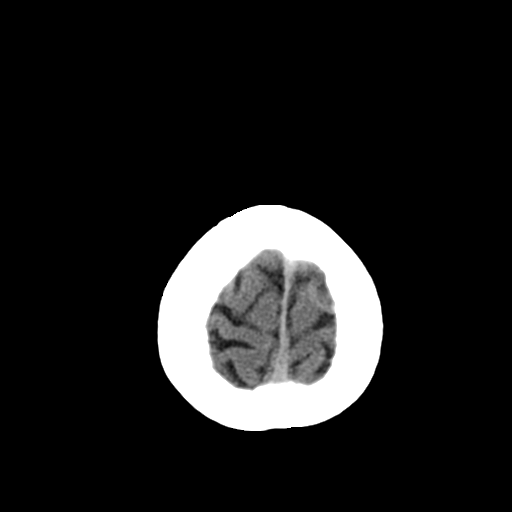

[Series 7: sagittal bone 2.0 · sagittal · 0.22mm/px · 3 of 59 slices shown]
[im 20/59  brain]
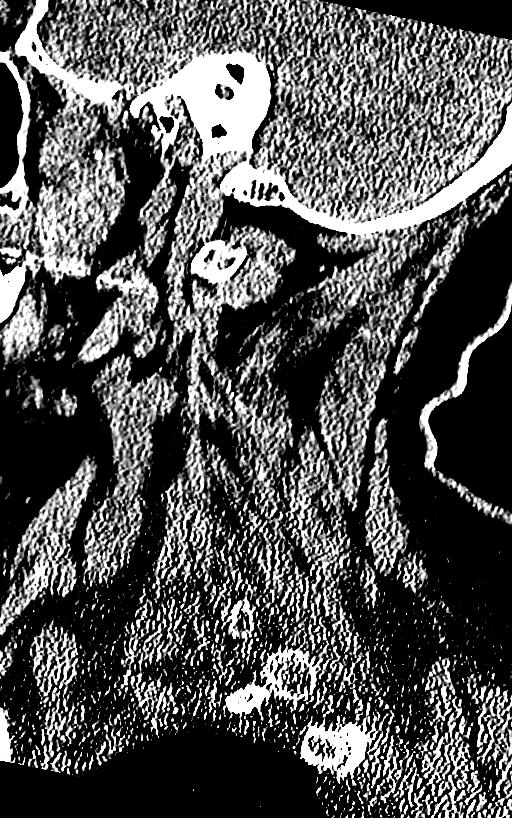
[im 30/59  brain]
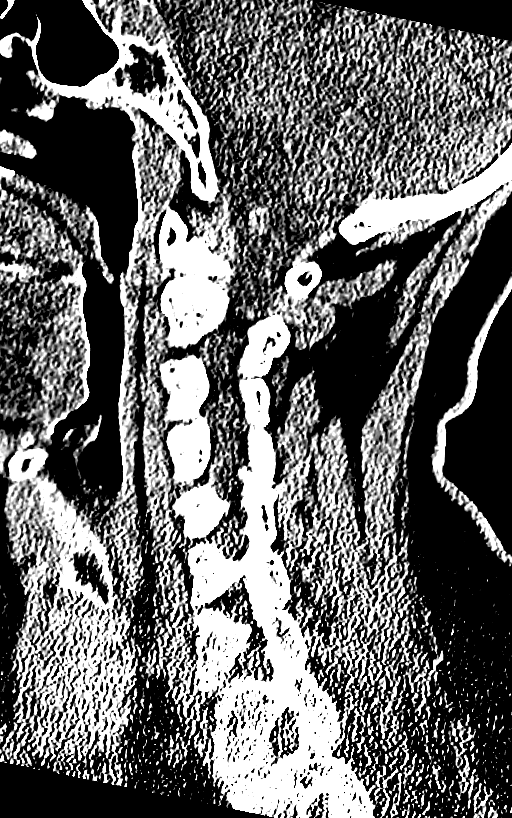
[im 39/59  brain]
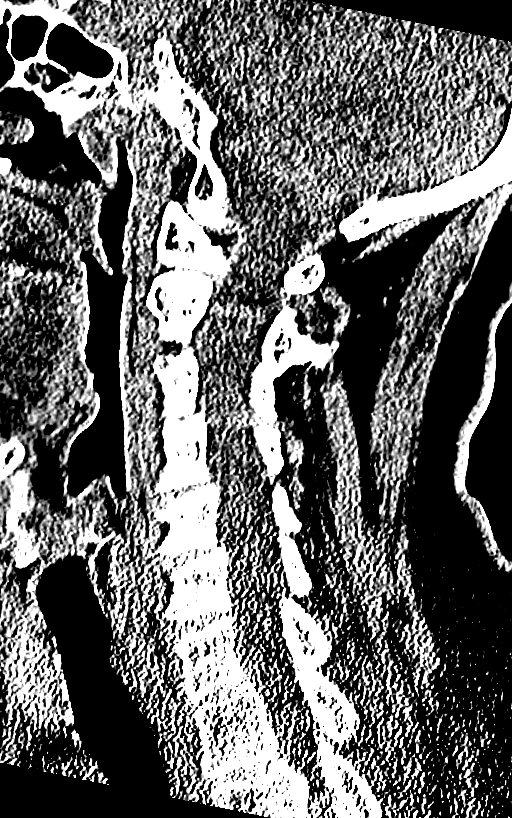

[Series 8: coronal bone 2.0 · coronal · 0.26mm/px · 3 of 54 slices shown]
[im 18/54  brain]
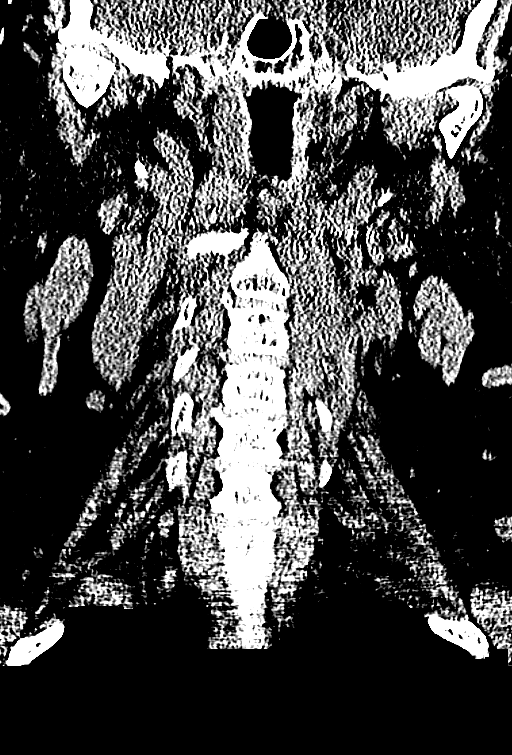
[im 24/54  brain]
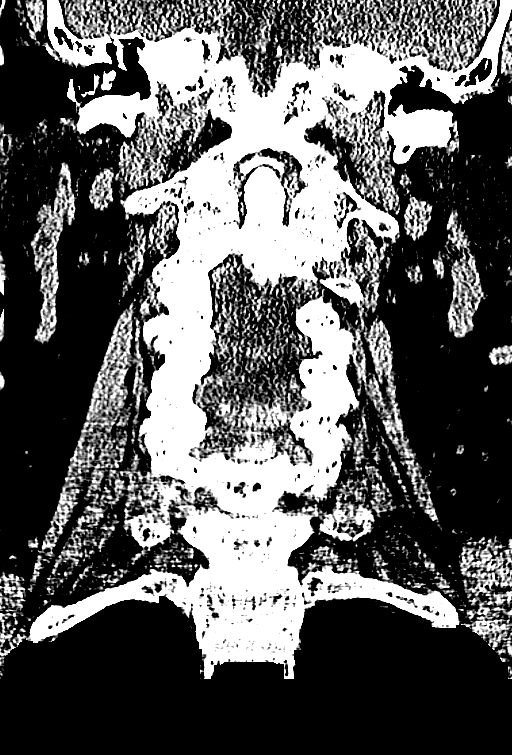
[im 30/54  brain]
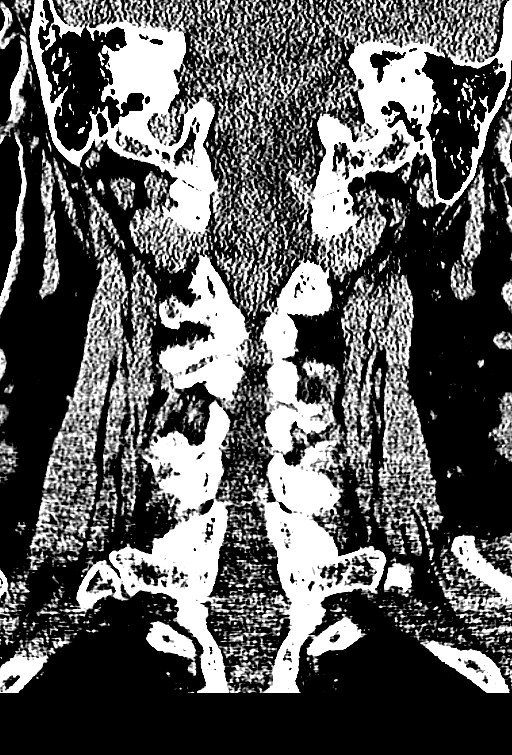

[Series 9: axial bone 2.0 · axial · 0.22mm/px · z∈[-182,-95]mm · 5 of 94 slices shown]
[im 8/94  bone]
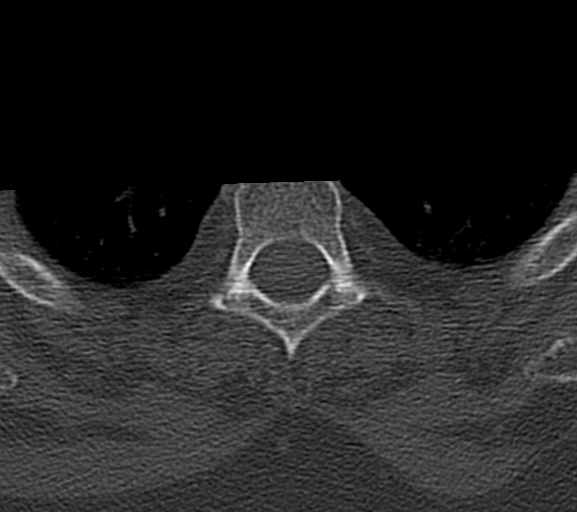
[im 24/94  bone]
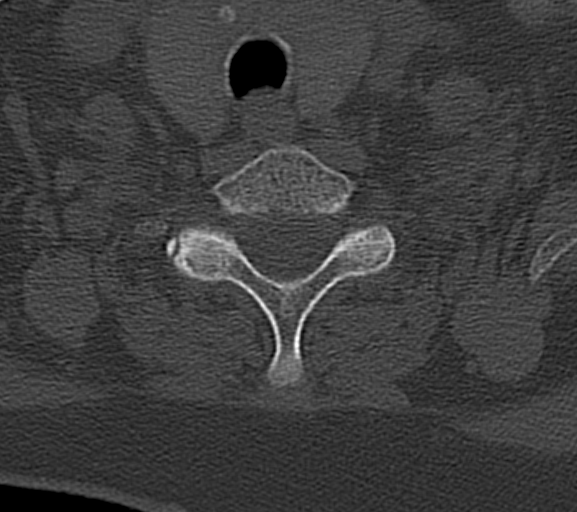
[im 32/94  bone]
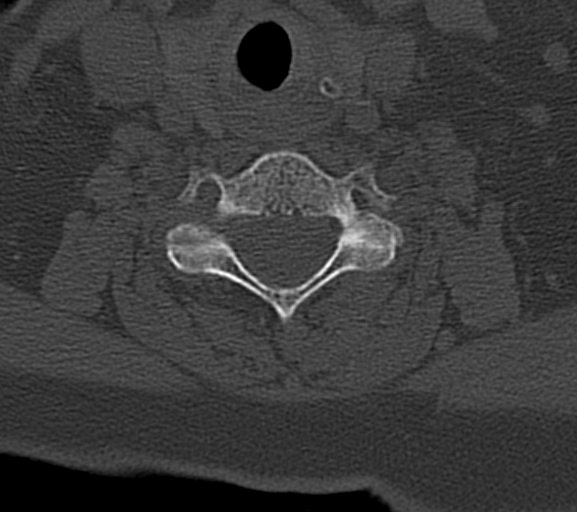
[im 39/94  bone]
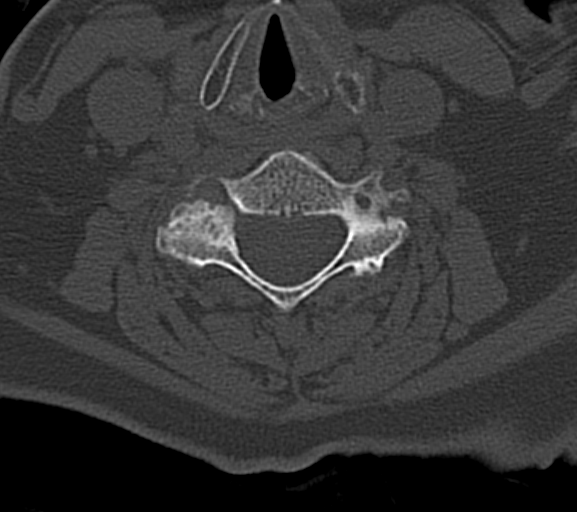
[im 55/94  bone]
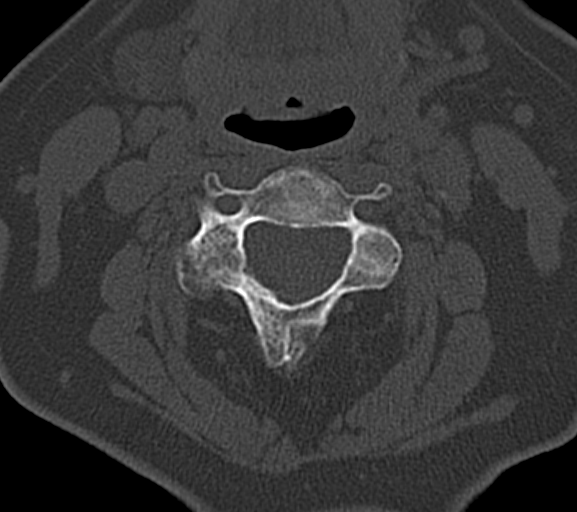

[14 of 47 positions shown; findings below may reference images not displayed]

FINDINGS: CT HEAD FINDINGS

There is no evidence of acute infarction, mass lesion, or intra- or
extra-axial hemorrhage on CT.

Mild subcortical white matter change likely reflects small vessel
ischemic microangiopathy.

The posterior fossa, including the cerebellum, brainstem and fourth
ventricle, is within normal limits. The third and lateral
ventricles, and basal ganglia are unremarkable in appearance. The
cerebral hemispheres are symmetric in appearance, with normal
gray-white differentiation. No mass effect or midline shift is seen.

There is no evidence of fracture; visualized osseous structures are
unremarkable in appearance. The visualized portions of the orbits
are within normal limits. The paranasal sinuses and mastoid air
cells are well-aerated. A soft tissue laceration is noted overlying
the high right frontal calvarium.

CT CERVICAL SPINE FINDINGS

There is no evidence of fracture or subluxation. Vertebral bodies
demonstrate normal height and alignment. Intervertebral disc spaces
are preserved. Prevertebral soft tissues are within normal limits.
Mild facet disease is noted along the cervical spine. The visualized
neural foramina are grossly unremarkable.

A calcified granuloma is noted within the thyroid isthmus, and a
vague subcentimeter hypodensity is noted at the anterior left
thyroid lobe. The visualized lung apices are clear. The right
external auditory canal is partially filled with cerumen.
IMPRESSION: 1. No evidence of traumatic intracranial injury or fracture.
2. No evidence of fracture or subluxation along the cervical spine.
3. Soft tissue laceration overlying the high right frontal
calvarium.
4. Mild small vessel ischemic microangiopathy.
5. Right external auditory canal is partially filled with cerumen.
6. Sub-centimeter thyroid nodule(s) noted, too small to
characterize, but most likely benign in the absence of known
clinical risk factors for thyroid carcinoma.

## 2014-12-15 ENCOUNTER — Ambulatory Visit (INDEPENDENT_AMBULATORY_CARE_PROVIDER_SITE_OTHER): Payer: Medicare Other | Admitting: *Deleted

## 2014-12-15 DIAGNOSIS — Z5181 Encounter for therapeutic drug level monitoring: Secondary | ICD-10-CM | POA: Diagnosis not present

## 2014-12-15 DIAGNOSIS — I82629 Acute embolism and thrombosis of deep veins of unspecified upper extremity: Secondary | ICD-10-CM | POA: Diagnosis not present

## 2014-12-15 DIAGNOSIS — Z7901 Long term (current) use of anticoagulants: Secondary | ICD-10-CM | POA: Diagnosis not present

## 2014-12-15 DIAGNOSIS — I82622 Acute embolism and thrombosis of deep veins of left upper extremity: Secondary | ICD-10-CM

## 2014-12-15 LAB — POCT INR: INR: 3

## 2015-01-12 ENCOUNTER — Ambulatory Visit (INDEPENDENT_AMBULATORY_CARE_PROVIDER_SITE_OTHER): Payer: Medicare Other | Admitting: Family Medicine

## 2015-01-12 ENCOUNTER — Encounter: Payer: Self-pay | Admitting: Family Medicine

## 2015-01-12 VITALS — BP 128/80 | Ht 61.0 in | Wt 169.0 lb

## 2015-01-12 DIAGNOSIS — Z7901 Long term (current) use of anticoagulants: Secondary | ICD-10-CM

## 2015-01-12 DIAGNOSIS — E041 Nontoxic single thyroid nodule: Secondary | ICD-10-CM

## 2015-01-12 DIAGNOSIS — Z139 Encounter for screening, unspecified: Secondary | ICD-10-CM

## 2015-01-12 DIAGNOSIS — M791 Myalgia: Secondary | ICD-10-CM | POA: Diagnosis not present

## 2015-01-12 DIAGNOSIS — IMO0001 Reserved for inherently not codable concepts without codable children: Secondary | ICD-10-CM

## 2015-01-12 DIAGNOSIS — K219 Gastro-esophageal reflux disease without esophagitis: Secondary | ICD-10-CM

## 2015-01-12 DIAGNOSIS — E785 Hyperlipidemia, unspecified: Secondary | ICD-10-CM | POA: Diagnosis not present

## 2015-01-12 DIAGNOSIS — M609 Myositis, unspecified: Secondary | ICD-10-CM

## 2015-01-12 MED ORDER — ROSUVASTATIN CALCIUM 5 MG PO TABS: 5.0000 mg | ORAL_TABLET | Freq: Every day | ORAL | Status: DC

## 2015-01-12 MED ORDER — PAROXETINE HCL 40 MG PO TABS: 40.0000 mg | ORAL_TABLET | Freq: Every day | ORAL | Status: DC

## 2015-01-12 MED ORDER — ONDANSETRON 8 MG PO TBDP: 8.0000 mg | ORAL_TABLET | Freq: Three times a day (TID) | ORAL | Status: DC | PRN

## 2015-01-12 NOTE — Progress Notes (Signed)
   Subjective:    Patient ID: Jacqueline Morgan, female    DOB: 12-Dec-1940, 74 y.o.   MRN: [id number]  Hyperlipidemia This is a chronic problem. Pertinent negatives include no chest pain. Treatments tried: crestor 5mg . Compliance problems include adherence to exercise.    nausea. Worse in the am. Started months ago. Taking zofran for nausea. Needs refill.patient states nausea is intermittent. She doesn't know if it's due to stress. She denies reflux. She denies vomiting denies abdominal pain no weight loss. Bowel movements normal no blood in her bowel movements.  Having pain all over. Worse in knees. Pt thinks its from fibromyalgia. Patient relates soreness in the muscles Paced states this been going on for quite some time she doesn't really know what is triggered this. Denies any fever sweats chills or weight change with it.  Patient has history of thyroid nodule it was recommended to repeat the ultrasound he denies dysphagia denies difficulty breathing  Patient under a lot of stress at times depressed stays at home a lot. Does not want counseling does not want no pills Declines flu vaccine.     Review of Systems  Constitutional: Negative for activity change, appetite change and fatigue.  HENT: Negative for congestion.   Respiratory: Negative for cough.   Cardiovascular: Negative for chest pain.  Gastrointestinal: Negative for abdominal pain.  Endocrine: Negative for polydipsia and polyphagia.  Neurological: Negative for weakness.  Psychiatric/Behavioral: Negative for confusion.  patient states he is clear intermittently to help her symptoms otherwise PPI does well     Objective:   Physical Exam  Constitutional: She appears well-nourished. No distress.  Cardiovascular: Normal rate, regular rhythm and normal heart sounds.   No murmur heard. Pulmonary/Chest: Effort normal and breath sounds normal. No respiratory distress.  Musculoskeletal: She exhibits no edema.  Lymphadenopathy:     She has no cervical adenopathy.  Neurological: She is alert. She exhibits normal muscle tone.  Psychiatric: Her behavior is normal.  Vitals reviewed.   Greater than 25 minutes spent with patient discussing multiple different issues moderate complexity given myalgias lab work thyroid nodule on ultrasound. Greater than half the time spent in consultation      Assessment & Plan:  Hyperlipidemia patient is having side effects from the medication but not severe she would prefer to continue to medication.  Patient with history of thyroid nodule this needs to be rechecked her radiologist's recommendation  Reflux under good control occasionally has to use Carafate  Patient defers on colonoscopy but does agree to Hemoccult cards 3  Significant anxiety mood disorder agrees to having some increased does not want nerve pills does not want counseling denies being suicidal

## 2015-01-13 NOTE — Progress Notes (Signed)
Blood work ordered in [facility]. Patient notified. 

## 2015-01-16 ENCOUNTER — Ambulatory Visit (HOSPITAL_COMMUNITY)
Admission: RE | Admit: 2015-01-16 | Discharge: 2015-01-16 | Disposition: A | Discharge status: Home / Self Care | Payer: Medicare Other | Source: Ambulatory Visit | Attending: Family Medicine | Admitting: Family Medicine

## 2015-01-16 DIAGNOSIS — Z7901 Long term (current) use of anticoagulants: Secondary | ICD-10-CM | POA: Diagnosis not present

## 2015-01-16 DIAGNOSIS — E042 Nontoxic multinodular goiter: Secondary | ICD-10-CM | POA: Diagnosis not present

## 2015-01-16 DIAGNOSIS — Z139 Encounter for screening, unspecified: Secondary | ICD-10-CM | POA: Diagnosis not present

## 2015-01-16 DIAGNOSIS — K219 Gastro-esophageal reflux disease without esophagitis: Secondary | ICD-10-CM | POA: Diagnosis not present

## 2015-01-16 DIAGNOSIS — M791 Myalgia: Secondary | ICD-10-CM | POA: Diagnosis not present

## 2015-01-16 DIAGNOSIS — M609 Myositis, unspecified: Secondary | ICD-10-CM | POA: Diagnosis not present

## 2015-01-16 DIAGNOSIS — E785 Hyperlipidemia, unspecified: Secondary | ICD-10-CM | POA: Diagnosis not present

## 2015-01-16 DIAGNOSIS — E041 Nontoxic single thyroid nodule: Secondary | ICD-10-CM | POA: Diagnosis not present

## 2015-01-17 LAB — HEPATIC FUNCTION PANEL
ALT: 16 IU/L (ref 0–32)
AST: 24 IU/L (ref 0–40)
Albumin: 3.9 g/dL (ref 3.5–4.8)
Alkaline Phosphatase: 92 IU/L (ref 39–117)
BILIRUBIN, DIRECT: 0.09 mg/dL (ref 0.00–0.40)
Bilirubin Total: 0.4 mg/dL (ref 0.0–1.2)
TOTAL PROTEIN: 6.7 g/dL (ref 6.0–8.5)

## 2015-01-17 LAB — CK: Total CK: 58 U/L (ref 24–173)

## 2015-01-17 LAB — SEDIMENTATION RATE

## 2015-01-26 ENCOUNTER — Ambulatory Visit (INDEPENDENT_AMBULATORY_CARE_PROVIDER_SITE_OTHER): Payer: Medicare Other | Admitting: *Deleted

## 2015-01-26 DIAGNOSIS — Z7901 Long term (current) use of anticoagulants: Secondary | ICD-10-CM

## 2015-01-26 DIAGNOSIS — I82622 Acute embolism and thrombosis of deep veins of left upper extremity: Secondary | ICD-10-CM | POA: Diagnosis not present

## 2015-01-26 DIAGNOSIS — Z5181 Encounter for therapeutic drug level monitoring: Secondary | ICD-10-CM | POA: Diagnosis not present

## 2015-01-26 LAB — POCT INR: INR: 3.4

## 2015-02-03 ENCOUNTER — Other Ambulatory Visit: Payer: Self-pay | Admitting: *Deleted

## 2015-02-03 ENCOUNTER — Telehealth: Payer: Self-pay | Admitting: *Deleted

## 2015-02-03 ENCOUNTER — Encounter: Payer: Self-pay | Admitting: Family Medicine

## 2015-02-03 DIAGNOSIS — Z139 Encounter for screening, unspecified: Secondary | ICD-10-CM

## 2015-02-03 DIAGNOSIS — Z7901 Long term (current) use of anticoagulants: Secondary | ICD-10-CM

## 2015-02-03 DIAGNOSIS — R195 Other fecal abnormalities: Secondary | ICD-10-CM

## 2015-02-03 LAB — POC HEMOCCULT BLD/STL (HOME/3-CARD/SCREEN)
CARD #3 DATE: 10252016
Card #1 Date: 10212016
Card #2 Date: 10242016
Card #3 Fecal Occult Blood, POC: POSITIVE
FECAL OCCULT BLD: NEGATIVE
Fecal Occult Blood, POC: NEGATIVE

## 2015-02-03 NOTE — Telephone Encounter (Signed)
Patient will need referral to gastroenterology for further evaluation please put in referral, notify patient, encouraged her not to get worried that it is best to get this checked out

## 2015-02-03 NOTE — Telephone Encounter (Signed)
Discussed with pt. Pt wants to talk with daughter first and call back with gi specialist that she wants to see.

## 2015-02-03 NOTE — Telephone Encounter (Signed)
Pt wants to see dr Laural Golden. Order for referral put in.

## 2015-02-03 NOTE — Telephone Encounter (Signed)
See result note for hemoccult cards. Negative times 2 and postivie times one.

## 2015-02-16 ENCOUNTER — Ambulatory Visit (INDEPENDENT_AMBULATORY_CARE_PROVIDER_SITE_OTHER): Payer: Medicare Other | Admitting: *Deleted

## 2015-02-16 DIAGNOSIS — I82A29 Chronic embolism and thrombosis of unspecified axillary vein: Secondary | ICD-10-CM

## 2015-02-16 DIAGNOSIS — Z7901 Long term (current) use of anticoagulants: Secondary | ICD-10-CM

## 2015-02-16 DIAGNOSIS — Z5181 Encounter for therapeutic drug level monitoring: Secondary | ICD-10-CM | POA: Diagnosis not present

## 2015-02-16 DIAGNOSIS — I82622 Acute embolism and thrombosis of deep veins of left upper extremity: Secondary | ICD-10-CM

## 2015-02-16 LAB — POCT INR: INR: 2.2

## 2015-03-05 ENCOUNTER — Other Ambulatory Visit: Payer: Self-pay | Admitting: Family Medicine

## 2015-03-05 NOTE — Telephone Encounter (Signed)
May have this and 4 refills 

## 2015-03-06 ENCOUNTER — Telehealth (INDEPENDENT_AMBULATORY_CARE_PROVIDER_SITE_OTHER): Payer: Self-pay | Admitting: *Deleted

## 2015-03-06 ENCOUNTER — Encounter (INDEPENDENT_AMBULATORY_CARE_PROVIDER_SITE_OTHER): Payer: Self-pay | Admitting: Internal Medicine

## 2015-03-06 ENCOUNTER — Ambulatory Visit (INDEPENDENT_AMBULATORY_CARE_PROVIDER_SITE_OTHER): Payer: Medicare Other | Admitting: Internal Medicine

## 2015-03-06 ENCOUNTER — Other Ambulatory Visit (INDEPENDENT_AMBULATORY_CARE_PROVIDER_SITE_OTHER): Payer: Self-pay | Admitting: Internal Medicine

## 2015-03-06 VITALS — BP 112/72 | HR 70 | Temp 97.7°F | Ht 60.0 in | Wt 168.2 lb

## 2015-03-06 DIAGNOSIS — R195 Other fecal abnormalities: Secondary | ICD-10-CM

## 2015-03-06 DIAGNOSIS — Z1211 Encounter for screening for malignant neoplasm of colon: Secondary | ICD-10-CM

## 2015-03-06 NOTE — Patient Instructions (Signed)
Colonoscopy.  The risks and benefits such as perforation, bleeding, and infection were reviewed with the patient and is agreeable. 

## 2015-03-06 NOTE — Telephone Encounter (Signed)
Patient needs trilyte 

## 2015-03-06 NOTE — Telephone Encounter (Signed)
Patient is scheduled for colonoscopy 04/01/15 and needs to stop Warfarin 5 days prior to procedure -- please advise if this is ok

## 2015-03-06 NOTE — Progress Notes (Signed)
   Subjective:    Patient ID: Jacqueline Morgan, female    DOB: 09-Aug-1940, 74 y.o.   MRN: [id number]  HPI Referred by Dr. Sallee Lange for + stool card. She denies seeing any blood. Her stools are brown in color.  She usually has a BM once every 2 weeks. There has been no weight loss. She says her appetite is not good. She eats 2 meals a day. Really no abdominal pain. Occasionally has bloating.  No dysphagia.  Patient states she will pass out of she has nausea, stomach ache or diarrhea. Has never had a colonoscopy.    CBC Latest Ref Rng 06/25/2014 12/20/2012 12/19/2012  WBC 3.4 - 10.8 x10E3/uL 6.0 9.1 9.5  Hemoglobin 11.1 - 15.9 g/dL 14.2 12.2 12.7  Hematocrit 34.0 - 46.6 % 42.7 36.1 38.0  Platelets 150 - 379 x10E3/uL 353 322 328    Review of Systems Past Medical History  Diagnosis Date  . Hyperlipidemia   . Palpitations   . Anxiety   . Fibromyalgia   . GERD (gastroesophageal reflux disease)     Past Surgical History  Procedure Laterality Date  . Embolectomy Left 12/19/2012    Procedure: EMBOLECTOMY OF LEFT BRACHIAL ARTERY;  Surgeon: Conrad Lafayette, MD;  Location: [facility];  Service: Vascular;  Laterality: Left;  [name] Eye surgery      Allergies  Allergen Reactions  . Codeine     REACTION: Adverse gastrointestinal effects  . Statins     Felt bad    Current Outpatient Prescriptions on File Prior to Visit  Medication Sig Dispense Refill  . acetaminophen (TYLENOL) 500 MG tablet Take 1,000 mg by mouth once as needed. For headache/pain    . calcium carbonate (OS-CAL) 600 MG TABS Take 600 mg by mouth daily.      [name] latanoprost (XALATAN) 0.005 % ophthalmic solution     . LORazepam (ATIVAN) 1 MG tablet TAKE ONE TABLET AT NOON, 1/2 AT SUPPER AND 1 AT BEDTIME. 75 tablet 4  . metoprolol succinate (TOPROL-XL) 50 MG 24 hr tablet Take 1 tablet by mouth  daily 90 tablet 3  . ondansetron (ZOFRAN-ODT) 8 MG disintegrating tablet Take 1 tablet (8 mg total) by mouth every 8 (eight) hours as needed  for nausea or vomiting. 30 tablet 0  . pantoprazole (PROTONIX) 40 MG tablet Take 1 tablet (40 mg total) by mouth 2 (two) times daily. 180 tablet 1  . PARoxetine (PAXIL) 40 MG tablet Take 1 tablet (40 mg total) by mouth daily. 30 tablet 5  . rosuvastatin (CRESTOR) 5 MG tablet Take 1 tablet (5 mg total) by mouth daily. 30 tablet 6  . sucralfate (CARAFATE) 1 G tablet TAKE 1 TABLET BY MOUTH BEFORE MEALS AND AT BEDTIME. 120 tablet 1  . warfarin (COUMADIN) 5 MG tablet Take 1/2 tablet daily except 1 tablet on Tuesdays and Saturdays 30 tablet 6   No current facility-administered medications on file prior to visit.        Objective:   Physical Exam   Alert and oriented. Skin warm and dry. Oral mucosa is moist.   . Sclera anicteric, conjunctivae is pink. Thyroid not enlarged. No cervical lymphadenopathy. Lungs clear. Heart regular rate and rhythm.  Abdomen is soft. Bowel sounds are positive. No hepatomegaly. No abdominal masses felt. No tenderness.  No edema to lower extremities.       Assessment & Plan:  Heme positive stool. Colonic neoplasm needs to be ruled out.

## 2015-03-09 ENCOUNTER — Encounter (INDEPENDENT_AMBULATORY_CARE_PROVIDER_SITE_OTHER): Payer: Self-pay | Admitting: Internal Medicine

## 2015-03-09 MED ORDER — PEG 3350-KCL-NA BICARB-NACL 420 G PO SOLR: 4000.0000 mL | Freq: Once | ORAL | Status: DC

## 2015-03-09 NOTE — Telephone Encounter (Signed)
Ok to hold coumadin 5 days prior to procedure.  Restart night of procedure.  I have an appt to see pt 03/16/15.  Will make her f/u appt with me then.

## 2015-03-09 NOTE — Telephone Encounter (Signed)
Forwarded to Dr Laural Golden, patient aware

## 2015-03-09 NOTE — Telephone Encounter (Signed)
Recommendations regarding holding warfarin noted.

## 2015-03-16 ENCOUNTER — Ambulatory Visit (INDEPENDENT_AMBULATORY_CARE_PROVIDER_SITE_OTHER): Payer: Medicare Other | Admitting: *Deleted

## 2015-03-16 DIAGNOSIS — Z7901 Long term (current) use of anticoagulants: Secondary | ICD-10-CM

## 2015-03-16 DIAGNOSIS — Z5181 Encounter for therapeutic drug level monitoring: Secondary | ICD-10-CM

## 2015-03-16 DIAGNOSIS — I82622 Acute embolism and thrombosis of deep veins of left upper extremity: Secondary | ICD-10-CM

## 2015-03-16 LAB — POCT INR: INR: 1.8

## 2015-04-01 ENCOUNTER — Encounter (HOSPITAL_COMMUNITY)
Admission: RE | Disposition: A | Discharge status: Home / Self Care | Payer: Self-pay | Source: Ambulatory Visit | Attending: Internal Medicine

## 2015-04-01 ENCOUNTER — Encounter (HOSPITAL_COMMUNITY): Payer: Self-pay

## 2015-04-01 ENCOUNTER — Ambulatory Visit (HOSPITAL_COMMUNITY)
Admission: RE | Admit: 2015-04-01 | Discharge: 2015-04-01 | Disposition: A | Discharge status: Home / Self Care | Payer: Medicare Other | Source: Ambulatory Visit | Attending: Internal Medicine | Admitting: Internal Medicine

## 2015-04-01 DIAGNOSIS — Z79899 Other long term (current) drug therapy: Secondary | ICD-10-CM | POA: Insufficient documentation

## 2015-04-01 DIAGNOSIS — Z800 Family history of malignant neoplasm of digestive organs: Secondary | ICD-10-CM | POA: Insufficient documentation

## 2015-04-01 DIAGNOSIS — K219 Gastro-esophageal reflux disease without esophagitis: Secondary | ICD-10-CM | POA: Diagnosis not present

## 2015-04-01 DIAGNOSIS — K644 Residual hemorrhoidal skin tags: Secondary | ICD-10-CM | POA: Insufficient documentation

## 2015-04-01 DIAGNOSIS — M797 Fibromyalgia: Secondary | ICD-10-CM | POA: Diagnosis not present

## 2015-04-01 DIAGNOSIS — D122 Benign neoplasm of ascending colon: Secondary | ICD-10-CM | POA: Insufficient documentation

## 2015-04-01 DIAGNOSIS — R195 Other fecal abnormalities: Secondary | ICD-10-CM | POA: Diagnosis not present

## 2015-04-01 DIAGNOSIS — K5730 Diverticulosis of large intestine without perforation or abscess without bleeding: Secondary | ICD-10-CM | POA: Diagnosis not present

## 2015-04-01 DIAGNOSIS — D124 Benign neoplasm of descending colon: Secondary | ICD-10-CM | POA: Diagnosis not present

## 2015-04-01 DIAGNOSIS — E785 Hyperlipidemia, unspecified: Secondary | ICD-10-CM | POA: Insufficient documentation

## 2015-04-01 DIAGNOSIS — F419 Anxiety disorder, unspecified: Secondary | ICD-10-CM | POA: Insufficient documentation

## 2015-04-01 DIAGNOSIS — D120 Benign neoplasm of cecum: Secondary | ICD-10-CM | POA: Diagnosis not present

## 2015-04-01 DIAGNOSIS — D123 Benign neoplasm of transverse colon: Secondary | ICD-10-CM | POA: Insufficient documentation

## 2015-04-01 DIAGNOSIS — K6389 Other specified diseases of intestine: Secondary | ICD-10-CM | POA: Diagnosis not present

## 2015-04-01 DIAGNOSIS — Z7901 Long term (current) use of anticoagulants: Secondary | ICD-10-CM | POA: Insufficient documentation

## 2015-04-01 HISTORY — PX: COLONOSCOPY: SHX5424

## 2015-04-01 SURGERY — COLONOSCOPY
Anesthesia: Moderate Sedation

## 2015-04-01 MED ORDER — MEPERIDINE HCL 50 MG/ML IJ SOLN
INTRAMUSCULAR | Status: AC
  Filled 2015-04-01: qty 1

## 2015-04-01 MED ORDER — STERILE WATER FOR IRRIGATION IR SOLN
Status: DC | PRN
  Administered 2015-04-01: 14:00:00

## 2015-04-01 MED ORDER — SODIUM CHLORIDE 0.9 % IV SOLN
INTRAVENOUS | Status: DC
  Administered 2015-04-01: 13:00:00 via INTRAVENOUS

## 2015-04-01 MED ORDER — MIDAZOLAM HCL 5 MG/5ML IJ SOLN
INTRAMUSCULAR | Status: DC | PRN
  Administered 2015-04-01 (×5): 2 mg via INTRAVENOUS

## 2015-04-01 MED ORDER — MIDAZOLAM HCL 5 MG/5ML IJ SOLN
INTRAMUSCULAR | Status: AC
  Filled 2015-04-01: qty 10

## 2015-04-01 MED ORDER — MEPERIDINE HCL 50 MG/ML IJ SOLN
INTRAMUSCULAR | Status: DC | PRN
  Administered 2015-04-01 (×2): 25 mg via INTRAVENOUS

## 2015-04-01 NOTE — Op Note (Signed)
COLONOSCOPY PROCEDURE REPORT  PATIENT:  Jacqueline Morgan  MR#:  [id number] Birthdate:  Jun 19, 1940, 74 y.o., female Endoscopist:  Dr. Rogene Houston, MD Referred By:  Dr. Sallee Lange, MD Procedure Date: 04/01/2015  Procedure:   Colonoscopy with snare polypectomy and single clip application.  Indications:   Patient is 74 year old Caucasian female who was recently found to have heme-positive stool. She denies melena or rectal bleeding. Warfarin is on hold.  Family history significant for active carcinoma in her mother at age 34. H he is later she is doing fine. Patient has never been screened for CRC.  Informed Consent:  The procedure and risks were reviewed with the patient and informed consent was obtained.  Medications:  Demerol 50 mg IV Versed 10 mg IV  Description of procedure:  After a digital rectal exam was performed, that colonoscope was advanced from the anus through the rectum and colon to the area of the cecum, ileocecal valve and appendiceal orifice. The cecum was deeply intubated. These structures were well-seen and photographed for the record. From the level of the cecum and ileocecal valve, the scope was slowly and cautiously withdrawn. The mucosal surfaces were carefully surveyed utilizing scope tip to flexion to facilitate fold flattening as needed. The scope was pulled down into the rectum where a thorough exam including retroflexion was performed.  Findings:   Prep excellent.  6 mm sessile cecal polyp was cold snared.  Seven small polyps were cold snared from ascending colon.  10 mm broad-based polyp hot snared from ascending colon. Single 360 Applied to  polypectomy site to reduce risk of post polypectomy bleed.  8 mm polyp hot snared from proximal transverse colon.  Three small polyps were cold snared from transverse colon.  Two small polyps were cold snared from descending colon.  Multiple diverticula at sigmoid colon.  All hemorrhoids below the dentate  line.   Therapeutic/Diagnostic Maneuvers Performed:  See above  Complications:  None  [id number]  Cecal Withdrawal Time:  45 minutes  Impression:   Examination performed to cecum.  Thirteen small polyps were cold snared and submitted together. Locations as above.  Two polyps that were hot snared were submitted together. Single clip applied to   polypectomy site at ascending colon.  Multiple sigmoid colon diverticula.  Small external hemorrhoids.    Comment:  All in all patient had 15 polyps ranging in size from 4-10 mm.  Recommendations:  Standard instructions given.  No aspirin or NSAIDs for 1 week.  Patient will resume warfarin at usual dose starting on 03/03/2015.  I will contact patient with biopsy results and further recommendations.  REHMAN,NAJEEB U  04/01/2015 3:09 PM  CC: Dr. Sallee Lange, MD & Dr. Rayne Du ref. provider found

## 2015-04-01 NOTE — Discharge Instructions (Signed)
No aspirin or NSAIDs for 10 days.  Resume warfarin on 04/02/2015 at usual dose.  Get INR checked in 7-10 days.  High fiber diet.  No driving for 24 hours.  Physician will call with biopsy results.     Colonoscopy, Care After These instructions give you information on caring for yourself after your procedure. Your doctor may also give you more specific instructions. Call your doctor if you have any problems or questions after your procedure. HOME CARE  Do not drive for 24 hours.  Do not sign important papers or use machinery for 24 hours.  You may shower.  You may go back to your usual activities, but go slower for the first 24 hours.  Take rest breaks often during the first 24 hours.  Walk around or use warm packs on your belly (abdomen) if you have belly cramping or gas.  Drink enough fluids to keep your pee (urine) clear or pale yellow.  Resume your normal diet. Avoid heavy or fried foods.  Avoid drinking alcohol for 24 hours or as told by your doctor.  Only take medicines as told by your doctor. If a tissue sample (biopsy) was taken during the procedure:   Do not take aspirin or blood thinners for 7 days, or as told by your doctor.  Do not drink alcohol for 7 days, or as told by your doctor.  Eat soft foods for the first 24 hours. GET HELP IF: You still have a small amount of blood in your poop (stool) 2-3 days after the procedure. GET HELP RIGHT AWAY IF:  You have more than a small amount of blood in your poop.  You see clumps of tissue (blood clots) in your poop.  Your belly is puffy (swollen).  You feel sick to your stomach (nauseous) or throw up (vomit).  You have a fever.  You have belly pain that gets worse and medicine does not help. MAKE SURE YOU:  Understand these instructions.  Will watch your condition.  Will get help right away if you are not doing well or get worse.   This information is not intended to replace advice given to you by  your health care provider. Make sure you discuss any questions you have with your health care provider.   Document Released: 04/23/2010 Document Revised: 03/26/2013 Document Reviewed: 11/26/2012 Elsevier Interactive Patient Education 2016 Elsevier Inc[employer]   Colon Polyps Polyps are lumps of extra tissue growing inside the body. Polyps can grow in the large intestine (colon). Most colon polyps are noncancerous (benign). However, some colon polyps can become cancerous over time. Polyps that are larger than a pea may be harmful. To be safe, caregivers remove and test all polyps. CAUSES  Polyps form when mutations in the genes cause your cells to grow and divide even though no more tissue is needed. RISK FACTORS There are a number of risk factors that can increase your chances of getting colon polyps. They include:  Being older than 50 years.  Family history of colon polyps or colon cancer.  Long-term colon diseases, such as colitis or Crohn disease.  Being overweight.  Smoking.  Being inactive.  Drinking too much alcohol. SYMPTOMS  Most small polyps do not cause symptoms. If symptoms are present, they may include:  Blood in the stool. The stool may look dark red or black.  Constipation or diarrhea that lasts longer than 1 week. DIAGNOSIS People often do not know they have polyps until their caregiver finds them during a  regular checkup. Your caregiver can use 4 tests to check for polyps:  Digital rectal exam. The caregiver wears gloves and feels inside the rectum. This test would find polyps only in the rectum.  Barium enema. The caregiver puts a liquid called barium into your rectum before taking X-rays of your colon. Barium makes your colon look white. Polyps are dark, so they are easy to see in the X-ray pictures.  Sigmoidoscopy. A thin, flexible tube (sigmoidoscope) is placed into your rectum. The sigmoidoscope has a light and tiny camera in it. The caregiver uses the  sigmoidoscope to look at the last third of your colon.  Colonoscopy. This test is like sigmoidoscopy, but the caregiver looks at the entire colon. This is the most common method for finding and removing polyps. TREATMENT  Any polyps will be removed during a sigmoidoscopy or colonoscopy. The polyps are then tested for cancer. PREVENTION  To help lower your risk of getting more colon polyps:  Eat plenty of fruits and vegetables. Avoid eating fatty foods.  Do not smoke.  Avoid drinking alcohol.  Exercise every day.  Lose weight if recommended by your caregiver.  Eat plenty of calcium and folate. Foods that are rich in calcium include milk, cheese, and broccoli. Foods that are rich in folate include chickpeas, kidney beans, and spinach. HOME CARE INSTRUCTIONS Keep all follow-up appointments as directed by your caregiver. You may need periodic exams to check for polyps. SEEK MEDICAL CARE IF: You notice bleeding during a bowel movement.   This information is not intended to replace advice given to you by your health care provider. Make sure you discuss any questions you have with your health care provider.   Document Released: 12/16/2003 Document Revised: 04/11/2014 Document Reviewed: 05/31/2011 Elsevier Interactive Patient Education 2016 Elsevier Inc.   High-Fiber Diet Fiber, also called dietary fiber, is a type of carbohydrate found in fruits, vegetables, whole grains, and beans. A high-fiber diet can have many health benefits. Your health care provider may recommend a high-fiber diet to help:  Prevent constipation. Fiber can make your bowel movements more regular.  Lower your cholesterol.  Relieve hemorrhoids, uncomplicated diverticulosis, or irritable bowel syndrome.  Prevent overeating as part of a weight-loss plan.  Prevent heart disease, type 2 diabetes, and certain cancers. WHAT IS MY PLAN? The recommended daily intake of fiber includes:  38 grams for men under age  52.  61 grams for men over age 23.  70 grams for women under age 30.  71 grams for women over age 87. You can get the recommended daily intake of dietary fiber by eating a variety of fruits, vegetables, grains, and beans. Your health care provider may also recommend a fiber supplement if it is not possible to get enough fiber through your diet. WHAT DO I NEED TO KNOW ABOUT A HIGH-FIBER DIET?  Fiber supplements have not been widely studied for their effectiveness, so it is better to get fiber through food sources.  Always check the fiber content on thenutrition facts label of any prepackaged food. Look for foods that contain at least 5 grams of fiber per serving.  Ask your dietitian if you have questions about specific foods that are related to your condition, especially if those foods are not listed in the following section.  Increase your daily fiber consumption gradually. Increasing your intake of dietary fiber too quickly may cause bloating, cramping, or gas.  Drink plenty of water. Water helps you to digest fiber. WHAT FOODS CAN I  EAT? Grains Whole-grain breads. Multigrain cereal. Oats and oatmeal. Brown rice. Barley. Bulgur wheat. Momence. Bran muffins. Popcorn. Rye wafer crackers. Vegetables Sweet potatoes. Spinach. Kale. Artichokes. Cabbage. Broccoli. Green peas. Carrots. Squash. Fruits Berries. Pears. Apples. Oranges. Avocados. Prunes and raisins. Dried figs. Meats and Other Protein Sources Navy, kidney, pinto, and soy beans. Split peas. Lentils. Nuts and seeds. Dairy Fiber-fortified yogurt. Beverages Fiber-fortified soy milk. Fiber-fortified orange juice. Other Fiber bars. The items listed above may not be a complete list of recommended foods or beverages. Contact your dietitian for more options. WHAT FOODS ARE NOT RECOMMENDED? Grains White bread. Pasta made with refined flour. White rice. Vegetables Fried potatoes. Canned vegetables. Well-cooked vegetables.   Fruits Fruit juice. Cooked, strained fruit. Meats and Other Protein Sources Fatty cuts of meat. Fried Sales executive or fried fish. Dairy Milk. Yogurt. Cream cheese. Sour cream. Beverages Soft drinks. Other Cakes and pastries. Butter and oils. The items listed above may not be a complete list of foods and beverages to avoid. Contact your dietitian for more information. WHAT ARE SOME TIPS FOR INCLUDING HIGH-FIBER FOODS IN MY DIET?  Eat a wide variety of high-fiber foods.  Make sure that half of all grains consumed each day are whole grains.  Replace breads and cereals made from refined flour or white flour with whole-grain breads and cereals.  Replace white rice with brown rice, bulgur wheat, or millet.  Start the day with a breakfast that is high in fiber, such as a cereal that contains at least 5 grams of fiber per serving.  Use beans in place of meat in soups, salads, or pasta.  Eat high-fiber snacks, such as berries, raw vegetables, nuts, or popcorn.   This information is not intended to replace advice given to you by your health care provider. Make sure you discuss any questions you have with your health care provider.   Document Released: 03/21/2005 Document Revised: 04/11/2014 Document Reviewed: 09/03/2013 Elsevier Interactive Patient Education [employer].

## 2015-04-01 NOTE — H&P (Signed)
Jacqueline Morgan is an 74 y.o. female.   Chief Complaint:   Jacqueline Morgan is here for colonoscopy. HPI:  Jacqueline Morgan is  74 year old Caucasian female who is here for diagnostic colonoscopy. Jacqueline Morgan was noted to have heme-positive stool. Jacqueline Morgan denies rectal bleeding or melena. Jacqueline Morgan also denies abdominal pain. Jacqueline Morgan is prone to constipation. Jacqueline Morgan usually has 3 bowel movements per week. This is Jacqueline Morgan's first exam. Family history significant for rectal cancer in mother who had colostomy at age 104. Jacqueline Morgan is doing fine at age 68.  Past Medical History  Diagnosis Date  . Hyperlipidemia   . Palpitations   . Anxiety   . Fibromyalgia   . GERD (gastroesophageal reflux disease)     Past Surgical History  Procedure Laterality Date  . Embolectomy Left 12/19/2012    Procedure: EMBOLECTOMY OF LEFT BRACHIAL ARTERY;  Surgeon: Conrad Depew, MD;  Location: [facility];  Service: Vascular;  Laterality: Left;  [name] Eye surgery      Family History  Problem Relation Age of Onset  . Cancer Mother     ovarian  . Heart disease Mother    Social History:  reports that Jacqueline Morgan has never smoked. Jacqueline Morgan has never used smokeless tobacco. Jacqueline Morgan reports that Jacqueline Morgan does not drink alcohol or use illicit drugs.  Allergies:  Allergies  Allergen Reactions  . Codeine     REACTION: Adverse gastrointestinal effects  . Statins     Felt bad    Medications Prior to Admission  Medication Sig Dispense Refill  . acetaminophen (TYLENOL) 500 MG tablet Take 1,000 mg by mouth once as needed. For headache/pain    . calcium carbonate (OS-CAL) 600 MG TABS Take 600 mg by mouth daily.      [name] latanoprost (XALATAN) 0.005 % ophthalmic solution     . LORazepam (ATIVAN) 1 MG tablet TAKE ONE TABLET AT NOON, 1/2 AT SUPPER AND 1 AT BEDTIME. 75 tablet 4  . metoprolol succinate (TOPROL-XL) 50 MG 24 hr tablet Take 1 tablet by mouth  daily 90 tablet 3  . ondansetron (ZOFRAN-ODT) 8 MG disintegrating tablet Take 1 tablet (8 mg total) by mouth every 8 (eight) hours as needed for  nausea or vomiting. 30 tablet 0  . pantoprazole (PROTONIX) 40 MG tablet Take 1 tablet (40 mg total) by mouth 2 (two) times daily. 180 tablet 1  . PARoxetine (PAXIL) 40 MG tablet Take 1 tablet (40 mg total) by mouth daily. 30 tablet 5  . polyethylene glycol-electrolytes (NULYTELY/GOLYTELY) 420 G solution Take 4,000 mLs by mouth once. 4000 mL 0  . rosuvastatin (CRESTOR) 5 MG tablet Take 1 tablet (5 mg total) by mouth daily. 30 tablet 6  . sucralfate (CARAFATE) 1 G tablet TAKE 1 TABLET BY MOUTH BEFORE MEALS AND AT BEDTIME. 120 tablet 1  . warfarin (COUMADIN) 5 MG tablet Take 1/2 tablet daily except 1 tablet on Tuesdays and Saturdays 30 tablet 6    No results found for this or any previous visit (from the past 48 hour(s)). No results found.  ROS  Blood pressure 160/71, pulse 87, temperature 98.4 F (36.9 C), temperature source Oral, resp. rate 26, height 5' (1.524 m), weight 167 lb (75.751 kg), SpO2 97 %. Physical Exam  Constitutional: Jacqueline Morgan appears well-developed and well-nourished.  HENT:  Mouth/Throat: Oropharynx is clear and moist.  Eyes: Conjunctivae are normal. No scleral icterus.  Neck: No thyromegaly present.  Cardiovascular: Normal rate, regular rhythm and normal heart sounds.   No murmur heard. Respiratory: Effort normal and breath  sounds normal.  GI: Soft. Jacqueline Morgan exhibits no distension and no mass. There is no tenderness.  Musculoskeletal: Jacqueline Morgan exhibits no edema.  Lymphadenopathy:    Jacqueline Morgan has no cervical adenopathy.  Neurological: Jacqueline Morgan is alert.  Skin: Skin is warm and dry.     Assessment/Plan  Heme positive stool.  family history of CRC in mother at late onset.  Diagnostic colonoscopy.  Stephaie Dardis U 04/01/2015, 1:39 PM

## 2015-04-02 ENCOUNTER — Other Ambulatory Visit: Payer: Self-pay | Admitting: Nurse Practitioner

## 2015-04-07 ENCOUNTER — Encounter (HOSPITAL_COMMUNITY): Payer: Self-pay | Admitting: Internal Medicine

## 2015-04-08 ENCOUNTER — Ambulatory Visit (INDEPENDENT_AMBULATORY_CARE_PROVIDER_SITE_OTHER): Payer: Medicare Other | Admitting: *Deleted

## 2015-04-08 DIAGNOSIS — Z7901 Long term (current) use of anticoagulants: Secondary | ICD-10-CM | POA: Diagnosis not present

## 2015-04-08 DIAGNOSIS — I82622 Acute embolism and thrombosis of deep veins of left upper extremity: Secondary | ICD-10-CM | POA: Diagnosis not present

## 2015-04-08 DIAGNOSIS — Z5181 Encounter for therapeutic drug level monitoring: Secondary | ICD-10-CM | POA: Diagnosis not present

## 2015-04-08 LAB — POCT INR: INR: 1.4

## 2015-04-20 ENCOUNTER — Ambulatory Visit (INDEPENDENT_AMBULATORY_CARE_PROVIDER_SITE_OTHER): Payer: Medicare Other | Admitting: *Deleted

## 2015-04-20 DIAGNOSIS — Z7901 Long term (current) use of anticoagulants: Secondary | ICD-10-CM

## 2015-04-20 DIAGNOSIS — I82622 Acute embolism and thrombosis of deep veins of left upper extremity: Secondary | ICD-10-CM | POA: Diagnosis not present

## 2015-04-20 DIAGNOSIS — Z5181 Encounter for therapeutic drug level monitoring: Secondary | ICD-10-CM | POA: Diagnosis not present

## 2015-04-20 LAB — POCT INR: INR: 2.8

## 2015-04-28 ENCOUNTER — Other Ambulatory Visit: Payer: Self-pay | Admitting: Cardiology

## 2015-05-11 ENCOUNTER — Ambulatory Visit (INDEPENDENT_AMBULATORY_CARE_PROVIDER_SITE_OTHER): Payer: Medicare Other | Admitting: *Deleted

## 2015-05-11 DIAGNOSIS — Z5181 Encounter for therapeutic drug level monitoring: Secondary | ICD-10-CM

## 2015-05-11 DIAGNOSIS — I82622 Acute embolism and thrombosis of deep veins of left upper extremity: Secondary | ICD-10-CM | POA: Diagnosis not present

## 2015-05-11 DIAGNOSIS — Z7901 Long term (current) use of anticoagulants: Secondary | ICD-10-CM | POA: Diagnosis not present

## 2015-05-11 LAB — POCT INR: INR: 2.5

## 2015-05-21 ENCOUNTER — Other Ambulatory Visit: Payer: Self-pay | Admitting: Family Medicine

## 2015-06-08 ENCOUNTER — Ambulatory Visit (INDEPENDENT_AMBULATORY_CARE_PROVIDER_SITE_OTHER): Payer: Medicare Other | Admitting: *Deleted

## 2015-06-08 DIAGNOSIS — Z5181 Encounter for therapeutic drug level monitoring: Secondary | ICD-10-CM | POA: Diagnosis not present

## 2015-06-08 DIAGNOSIS — Z7901 Long term (current) use of anticoagulants: Secondary | ICD-10-CM | POA: Diagnosis not present

## 2015-06-08 DIAGNOSIS — I82622 Acute embolism and thrombosis of deep veins of left upper extremity: Secondary | ICD-10-CM

## 2015-06-08 LAB — POCT INR: INR: 2.8

## 2015-06-15 ENCOUNTER — Encounter: Payer: Self-pay | Admitting: *Deleted

## 2015-07-06 ENCOUNTER — Ambulatory Visit (INDEPENDENT_AMBULATORY_CARE_PROVIDER_SITE_OTHER): Payer: Medicare Other | Admitting: *Deleted

## 2015-07-06 DIAGNOSIS — Z7901 Long term (current) use of anticoagulants: Secondary | ICD-10-CM | POA: Diagnosis not present

## 2015-07-06 DIAGNOSIS — I82622 Acute embolism and thrombosis of deep veins of left upper extremity: Secondary | ICD-10-CM | POA: Diagnosis not present

## 2015-07-06 DIAGNOSIS — Z5181 Encounter for therapeutic drug level monitoring: Secondary | ICD-10-CM | POA: Diagnosis not present

## 2015-07-06 LAB — POCT INR: INR: 2

## 2015-07-09 ENCOUNTER — Encounter: Payer: Self-pay | Admitting: Cardiology

## 2015-07-09 ENCOUNTER — Ambulatory Visit (INDEPENDENT_AMBULATORY_CARE_PROVIDER_SITE_OTHER): Payer: Medicare Other | Admitting: Cardiology

## 2015-07-09 VITALS — BP 118/78 | HR 70 | Ht 61.0 in | Wt 171.0 lb

## 2015-07-09 DIAGNOSIS — R0789 Other chest pain: Secondary | ICD-10-CM

## 2015-07-09 DIAGNOSIS — R0602 Shortness of breath: Secondary | ICD-10-CM

## 2015-07-09 DIAGNOSIS — M79602 Pain in left arm: Secondary | ICD-10-CM | POA: Diagnosis not present

## 2015-07-09 DIAGNOSIS — I10 Essential (primary) hypertension: Secondary | ICD-10-CM

## 2015-07-09 NOTE — Progress Notes (Addendum)
Patient ID: Jacqueline Morgan, female   DOB: 1940/12/13, 75 y.o.   MRN: [id number]     Clinical Summary Ms. Beye is a 75 y.o.female seen today for follow up of the following medical problems.   1. 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  - underwent left arm thromboembolectomy 12/19/12 by Dr Adele Barthel  - echo was negative for thrombus. There was some question of possible aortic arch atherosclerosis. Discharged on coumadin. - denies any bleeding issues on coumadin. Notes some occsaional pain in left arm with activity that is unchanged..  - has not been in favor of NOACs  - denies any bleeding troubles on coumadin. Recent left arm pain at site of previous embolic event for the last few weeks.   2. Palpitations - previous event monitor shows symptoms correlate with NSR.  - decreased frequency in palpitations since last visit, fairly infrequent.     3. Chest pain -recent  4/10 in severity in mid to left chest pain. Often occurs with rest. Not positional. Pain occurs a few time a month.  DOE room to room which is new. Denies any LE edema. Can have some orthopnea, sleeps on 2-3 pillows.   4. Hyperlipidemia - 11/2014 TC 233 TG 300 HDL 38 LDL 135 - has upcoming labs with pcp, she is taking crestor 3 days a week due to side effects. Followed closely by Dr Wolfgang Phoenix.  Past Medical History  Diagnosis Date  . Hyperlipidemia   . Palpitations   . Anxiety   . Fibromyalgia   . GERD (gastroesophageal reflux disease)      Allergies  Allergen Reactions  . Codeine     REACTION: Adverse gastrointestinal effects  . Statins     Felt bad     Current Outpatient Prescriptions  Medication Sig Dispense Refill  . acetaminophen (TYLENOL) 500 MG tablet Take 1,000 mg by mouth once as needed. For headache/pain    . calcium carbonate (OS-CAL) 600 MG TABS Take 600 mg by mouth daily.      [name] latanoprost (XALATAN)  0.005 % ophthalmic solution     . LORazepam (ATIVAN) 1 MG tablet TAKE ONE TABLET AT NOON, 1/2 AT SUPPER AND 1 AT BEDTIME. 75 tablet 4  . metoprolol succinate (TOPROL-XL) 50 MG 24 hr tablet Take 1 tablet by mouth  daily 90 tablet 3  . ondansetron (ZOFRAN-ODT) 8 MG disintegrating tablet Take 1 tablet (8 mg total) by mouth every 8 (eight) hours as needed for nausea or vomiting. 30 tablet 0  . pantoprazole (PROTONIX) 40 MG tablet Take 1 tablet (40 mg total) by mouth 2 (two) times daily. 180 tablet 1  . pantoprazole (PROTONIX) 40 MG tablet Take 1 tablet by mouth two  times daily 180 tablet 0  . pantoprazole (PROTONIX) 40 MG tablet Take 1 tablet by mouth two  times daily 180 tablet 0  . PARoxetine (PAXIL) 40 MG tablet Take 1 tablet (40 mg total) by mouth daily. 30 tablet 5  . rosuvastatin (CRESTOR) 5 MG tablet Take 1 tablet (5 mg total) by mouth daily. 30 tablet 6  . sucralfate (CARAFATE) 1 G tablet TAKE 1 TABLET BY MOUTH BEFORE MEALS AND AT BEDTIME. 120 tablet 1  . warfarin (COUMADIN) 5 MG tablet Take 1/2 tablet daily except 1 tablet on Mondays and Thursdays 30 tablet 3   No current facility-administered medications for this visit.     Past Surgical History  Procedure Laterality Date  . Embolectomy Left 12/19/2012    Procedure: EMBOLECTOMY OF LEFT BRACHIAL ARTERY;  Surgeon: Conrad Angels, MD;  Location: [facility];  Service: Vascular;  Laterality: Left;  [name] Eye surgery    . Colonoscopy N/A 04/01/2015    Procedure: COLONOSCOPY;  Surgeon: Rogene Houston, MD;  [facility];  Service: Endoscopy;  Laterality: N/A;  1:45     Allergies  Allergen Reactions  . Codeine     REACTION: Adverse gastrointestinal effects  . Statins     Felt bad      Family History  Problem Relation Age of Onset  . Cancer Mother     ovarian  . Heart disease Mother      Social History Ms. Sanft reports that she has never smoked. She has never used smokeless tobacco. Ms. Prato reports that she does  not drink alcohol.   Review of Systems CONSTITUTIONAL: No weight loss, fever, chills, weakness or fatigue.  HEENT: Eyes: No visual loss, blurred vision, double vision or yellow sclerae.No hearing loss, sneezing, congestion, runny nose or sore throat.  SKIN: No rash or itching.  CARDIOVASCULAR: per HPI RESPIRATORY: No shortness of breath, cough or sputum.  GASTROINTESTINAL: No anorexia, nausea, vomiting or diarrhea. No abdominal pain or blood.  GENITOURINARY: No burning on urination, no polyuria NEUROLOGICAL: No headache, dizziness, syncope, paralysis, ataxia, numbness or tingling in the extremities. No change in bowel or bladder control.  MUSCULOSKELETAL: No muscle, back pain, joint pain or stiffness.  LYMPHATICS: No enlarged nodes. No history of splenectomy.  PSYCHIATRIC: No history of depression or anxiety.  ENDOCRINOLOGIC: No reports of sweating, cold or heat intolerance. No polyuria or polydipsia.  [name]   Physical Examination Filed Vitals:   07/09/15 1054  BP: 118/78  Pulse: 70   Filed Vitals:   07/09/15 1054  Height: 5\' 1"  (1.549 m)  Weight: 171 lb (77.565 kg)    Gen: resting comfortably, no acute distress HEENT: no scleral icterus, pupils equal round and reactive, no palptable cervical adenopathy,  CV: RRR, no m/r/g, no jvd Resp: Clear to auscultation bilaterally GI: abdomen is soft, non-tender, non-distended, normal bowel sounds, no hepatosplenomegaly MSK: extremities are warm, no edema.  Skin: warm, no rash Neuro:  no focal deficits Psych: appropriate affect   Diagnostic Studies 05/2013 21 day Event monitor No arrythmias     Assessment and Plan   1. Left brachial artery occlusion  - secondary to thromboembolism, unclear source of the embolism (cardiac vs aortic atheroscerlosis)  - recent pain in left upper extremity at prior site, we will check arterial US.    2. Palpitations - nevative event monitor 05/2013. Hx of anxiety, probable etiology - symptoms  decreased since last visit, continue to monitor  3. Chest pain  - recent DOE and chest pain.  - EKG in clinic without ischemic changes. - Will check echo, pending results likely would obtain lexiscan/   4. Hyperlipidemia - only tolerating crestor every other day, continue current therapy, F/u upcoming labs with pcp   F/u 6 weeks     Arnoldo Lenis, M.D.

## 2015-07-09 NOTE — Patient Instructions (Signed)
Your physician recommends that you schedule a follow-up appointment in: 6 weeks   Your physician recommends that you continue on your current medications as directed. Please refer to the Current Medication list given to you today.    Your physician has requested that you have an echocardiogram. Echocardiography is a painless test that uses sound waves to create images of your heart. It provides your doctor with information about the size and shape of your heart and how well your heart's chambers and valves are working. This procedure takes approximately one hour. There are no restrictions for this procedure.  Your physician has requested that you have  upper extremity arterial duplex. This test is an ultrasound of the arteries in the  arms. It looks at arterial blood flow in the  arms. Allow one hour for Lower and Upper Arterial scans. There are no restrictions or special instructions     Thank you for choosing [facility] !

## 2015-07-13 ENCOUNTER — Encounter: Payer: Self-pay | Admitting: Family Medicine

## 2015-07-13 ENCOUNTER — Ambulatory Visit (INDEPENDENT_AMBULATORY_CARE_PROVIDER_SITE_OTHER): Payer: Medicare Other | Admitting: Family Medicine

## 2015-07-13 VITALS — BP 120/74 | Ht 61.0 in | Wt 172.0 lb

## 2015-07-13 DIAGNOSIS — E785 Hyperlipidemia, unspecified: Secondary | ICD-10-CM

## 2015-07-13 DIAGNOSIS — R5383 Other fatigue: Secondary | ICD-10-CM | POA: Diagnosis not present

## 2015-07-13 DIAGNOSIS — F410 Panic disorder [episodic paroxysmal anxiety] without agoraphobia: Secondary | ICD-10-CM | POA: Diagnosis not present

## 2015-07-13 MED ORDER — PAROXETINE HCL 20 MG PO TABS: ORAL_TABLET | ORAL | Status: DC

## 2015-07-13 MED ORDER — PAROXETINE HCL 40 MG PO TABS: 40.0000 mg | ORAL_TABLET | Freq: Every day | ORAL | Status: DC

## 2015-07-13 MED ORDER — LORAZEPAM 1 MG PO TABS: ORAL_TABLET | ORAL | Status: DC

## 2015-07-13 NOTE — Progress Notes (Signed)
   Subjective:    Patient ID: Jacqueline Morgan, female    DOB: 23-Oct-1940, 75 y.o.   MRN: [id number]  Hyperlipidemia This is a chronic problem. The current episode started more than 1 year ago. The problem is uncontrolled. Recent lipid tests were reviewed and are high. Pertinent negatives include no chest pain. Current antihyperlipidemic treatment includes statins. The current treatment provides mild improvement of lipids. There are no compliance problems.  Risk factors for coronary artery disease include dyslipidemia, family history and a sedentary lifestyle.  Anxiety Presents for follow-up visit. Symptoms include depressed mood, insomnia, malaise, panic and restlessness. Patient reports no chest pain, confusion or impotence. Symptoms occur constantly. The severity of symptoms is severe. The symptoms are aggravated by specific phobias. The quality of sleep is fair. Nighttime awakenings: occasional.   Past treatments include benzodiazephines and SSRIs. The treatment provided mild relief. Compliance with prior treatments has been good. Compliance with medications is 76-100%.  Patient denies chest tightness pressure pain shortness of breath. She does find himself anxious nervous she is grieving over the loss of her husband from a while back  Patient has concerns of anxiety.  Review of Systems  Constitutional: Negative for activity change, appetite change and fatigue.  HENT: Negative for congestion.   Respiratory: Negative for cough.   Cardiovascular: Negative for chest pain.  Gastrointestinal: Negative for abdominal pain.  Endocrine: Negative for polydipsia and polyphagia.  Genitourinary: Negative for impotence.  Neurological: Negative for weakness.  Psychiatric/Behavioral: Negative for confusion. The patient has insomnia.    25 minutes was spent with the patient. Greater than half the time was spent in discussion and answering questions and counseling regarding the issues that the patient  came in for today. Greater than half the time spent discussing the anxiety panic attacks and treatment options as well as hyperlipidemia    Objective:   Physical Exam  Constitutional: She appears well-nourished. No distress.  Cardiovascular: Normal rate, regular rhythm and normal heart sounds.   No murmur heard. Pulmonary/Chest: Effort normal and breath sounds normal. No respiratory distress.  Musculoskeletal: She exhibits no edema.  Lymphadenopathy:    She has no cervical adenopathy.  Neurological: She is alert. She exhibits normal muscle tone.  Psychiatric: Her behavior is normal.  Vitals reviewed.         Assessment & Plan:  Hyperlipidemia check lab work. Encourage patient to take her medicine she is taking 2 times per week that is is much as she can tolerate  Commendation of anxiety depression panic attacks. Difficult situation. Increase Paxil to 60 mg a day follow-up within 6 weeks nerve medication half a tablet in the lunch hour and one in the evening caution drowsiness encourage counseling patient defers on this currently.  Patient denies any bleeding issues with Coumadin. They are monitoring her in the cardiac clinic  Patient denies any severe reflux symptoms medication seems to be helping.

## 2015-07-16 ENCOUNTER — Ambulatory Visit (HOSPITAL_COMMUNITY)
Admission: RE | Admit: 2015-07-16 | Discharge: 2015-07-16 | Disposition: A | Discharge status: Home / Self Care | Payer: Medicare Other | Source: Ambulatory Visit | Attending: Cardiology | Admitting: Cardiology

## 2015-07-16 DIAGNOSIS — E785 Hyperlipidemia, unspecified: Secondary | ICD-10-CM | POA: Insufficient documentation

## 2015-07-16 DIAGNOSIS — M79602 Pain in left arm: Secondary | ICD-10-CM | POA: Diagnosis not present

## 2015-07-16 DIAGNOSIS — R0602 Shortness of breath: Secondary | ICD-10-CM | POA: Diagnosis not present

## 2015-07-16 DIAGNOSIS — I358 Other nonrheumatic aortic valve disorders: Secondary | ICD-10-CM | POA: Insufficient documentation

## 2015-07-16 DIAGNOSIS — I119 Hypertensive heart disease without heart failure: Secondary | ICD-10-CM | POA: Insufficient documentation

## 2015-07-16 DIAGNOSIS — M79601 Pain in right arm: Secondary | ICD-10-CM | POA: Diagnosis not present

## 2015-07-16 DIAGNOSIS — I059 Rheumatic mitral valve disease, unspecified: Secondary | ICD-10-CM | POA: Diagnosis not present

## 2015-07-17 DIAGNOSIS — E785 Hyperlipidemia, unspecified: Secondary | ICD-10-CM | POA: Diagnosis not present

## 2015-07-17 DIAGNOSIS — R5383 Other fatigue: Secondary | ICD-10-CM | POA: Diagnosis not present

## 2015-07-18 LAB — HEPATIC FUNCTION PANEL
ALK PHOS: 87 IU/L (ref 39–117)
ALT: 11 IU/L (ref 0–32)
AST: 19 IU/L (ref 0–40)
Albumin: 3.8 g/dL (ref 3.5–4.8)
Bilirubin Total: 0.4 mg/dL (ref 0.0–1.2)
Bilirubin, Direct: 0.08 mg/dL (ref 0.00–0.40)
Total Protein: 7 g/dL (ref 6.0–8.5)

## 2015-07-18 LAB — BASIC METABOLIC PANEL
BUN / CREAT RATIO: 13 (ref 12–28)
BUN: 12 mg/dL (ref 8–27)
CHLORIDE: 100 mmol/L (ref 96–106)
CO2: 23 mmol/L (ref 18–29)
CREATININE: 0.9 mg/dL (ref 0.57–1.00)
Calcium: 8.9 mg/dL (ref 8.7–10.3)
GFR calc Af Amer: 73 mL/min/{1.73_m2} (ref >59)
GFR calc non Af Amer: 63 mL/min/{1.73_m2} (ref >59)
GLUCOSE: 106 mg/dL — AB (ref 65–99)
POTASSIUM: 4.3 mmol/L (ref 3.5–5.2)
SODIUM: 141 mmol/L (ref 134–144)

## 2015-07-18 LAB — TSH: TSH: 1.9 u[IU]/mL (ref 0.450–4.500)

## 2015-07-18 LAB — LIPID PANEL
CHOLESTEROL TOTAL: 243 mg/dL — AB (ref 100–199)
Chol/HDL Ratio: 6.1 ratio units — ABNORMAL HIGH (ref 0.0–4.4)
HDL: 40 mg/dL (ref >39)
LDL CALC: 142 mg/dL — AB (ref 0–99)
TRIGLYCERIDES: 307 mg/dL — AB (ref 0–149)
VLDL CHOLESTEROL CAL: 61 mg/dL — AB (ref 5–40)

## 2015-07-21 NOTE — Addendum Note (Signed)
Addended by: Ofilia Neas R on: 07/21/2015 11:42 AM   Modules accepted: Orders

## 2015-07-29 ENCOUNTER — Telehealth: Payer: Self-pay

## 2015-07-29 NOTE — Telephone Encounter (Signed)
-----   Message from Susa Griffins sent at 07/29/2015  3:04 PM EDT ----- Regarding: Echo results? Please verify that this patient received her echo results from 07/16/15.  It is documented that she did receive her [place] results.

## 2015-07-30 ENCOUNTER — Encounter: Payer: Self-pay | Admitting: Family Medicine

## 2015-07-30 ENCOUNTER — Telehealth: Payer: Self-pay | Admitting: *Deleted

## 2015-07-30 NOTE — Telephone Encounter (Signed)
-----   Message from Arnoldo Lenis, MD sent at 07/30/2015 11:11 AM EDT ----- Not sure who is off today so I forwared to both of you guys, but please let patient know that her echo overall looks good. Please let her know about our computer problems regarding the late notification and apologized.  How are her chest pain and SOB doing? If still ongoing I would like to get a lexiscan, she does not need to hold an meds  Zandra Abts MD ----- Message -----    From: Drema Dallas, CMA    Sent: 07/29/2015   4:21 PM      To: Arnoldo Lenis, MD  Please result echo. Thanks!

## 2015-07-30 NOTE — Telephone Encounter (Signed)
Patient notified of echo results and voiced understanding. Patient states that she is feeling better.

## 2015-07-31 ENCOUNTER — Other Ambulatory Visit: Payer: Self-pay | Admitting: Family Medicine

## 2015-07-31 NOTE — Telephone Encounter (Signed)
Given your symptoms you may stop the chol med BUT if your symptoms dont improve then it was not the chol med causing this. Please keep follow up with cardiology and see [place] later this summer

## 2015-08-17 ENCOUNTER — Ambulatory Visit (INDEPENDENT_AMBULATORY_CARE_PROVIDER_SITE_OTHER): Payer: Medicare Other | Admitting: *Deleted

## 2015-08-17 DIAGNOSIS — Z5181 Encounter for therapeutic drug level monitoring: Secondary | ICD-10-CM | POA: Diagnosis not present

## 2015-08-17 DIAGNOSIS — I82622 Acute embolism and thrombosis of deep veins of left upper extremity: Secondary | ICD-10-CM

## 2015-08-17 DIAGNOSIS — Z7901 Long term (current) use of anticoagulants: Secondary | ICD-10-CM

## 2015-08-17 LAB — POCT INR: INR: 3

## 2015-08-18 DIAGNOSIS — Z1231 Encounter for screening mammogram for malignant neoplasm of breast: Secondary | ICD-10-CM | POA: Diagnosis not present

## 2015-08-21 ENCOUNTER — Ambulatory Visit: Payer: Medicare Other | Admitting: Cardiology

## 2015-08-24 ENCOUNTER — Encounter: Payer: Self-pay | Admitting: Family Medicine

## 2015-08-24 ENCOUNTER — Ambulatory Visit (INDEPENDENT_AMBULATORY_CARE_PROVIDER_SITE_OTHER): Payer: Medicare Other | Admitting: Family Medicine

## 2015-08-24 VITALS — BP 112/74 | Ht 61.0 in | Wt 170.0 lb

## 2015-08-24 DIAGNOSIS — R5383 Other fatigue: Secondary | ICD-10-CM | POA: Diagnosis not present

## 2015-08-24 DIAGNOSIS — R1013 Epigastric pain: Secondary | ICD-10-CM

## 2015-08-24 DIAGNOSIS — I951 Orthostatic hypotension: Secondary | ICD-10-CM | POA: Diagnosis not present

## 2015-08-24 DIAGNOSIS — R110 Nausea: Secondary | ICD-10-CM | POA: Diagnosis not present

## 2015-08-24 MED ORDER — METOPROLOL SUCCINATE ER 25 MG PO TB24: 25.0000 mg | ORAL_TABLET | Freq: Every day | ORAL | Status: DC

## 2015-08-24 MED ORDER — PAROXETINE HCL 20 MG PO TABS: ORAL_TABLET | ORAL | Status: DC

## 2015-08-24 NOTE — Progress Notes (Signed)
   Subjective:    Patient ID: Jacqueline Morgan, female    DOB: [birth year], 75 y.o.   MRN: [id number]  HPIFollow up depression. paxil was increased at last visit to 60mg . Takes 20mg  one qam and 2 in the evening. Pt wants a refill sent to mail order for 90 day supply.  Nausea for awhile. Takes zofran if its real real bad.  Patient relates some epigastric discomfort denies high fever chills sweats no wheezing She has not tried anything for it She takes her acid blocker medicine on a regular basis states it seems to help Patient relates her depression is doing much better. She would like refills of that medicine sent in.   Review of Systems  Constitutional: Positive for fatigue. Negative for fever.  HENT: Negative for congestion.   Respiratory: Negative for cough and shortness of breath.   Cardiovascular: Negative for chest pain.  Gastrointestinal: Negative for abdominal pain and blood in stool.  Neurological: Positive for dizziness.       Objective:   Physical Exam  Constitutional: She appears well-nourished. No distress.  Cardiovascular: Normal rate, regular rhythm and normal heart sounds.   No murmur heard. Pulmonary/Chest: Effort normal and breath sounds normal. No respiratory distress.  Musculoskeletal: She exhibits no edema.  Lymphadenopathy:    She has no cervical adenopathy.  Neurological: She is alert. She exhibits normal muscle tone.  Psychiatric: Her behavior is normal.  Vitals reviewed.         Assessment & Plan:  Send letter to Dr Oletta Cohn having significant fatigue with activity. She is orthostatic. Raises into question whether or not Toprol is causing issues. Reduced dose 25 mg daily. Patient also has significant cardiac risk factors she denies angina symptoms but females do not always get angina with coronary artery disease patient also has risk factors for heart disease and unable to tolerate statins.  Will reduce dose of metoprolol 25 mg patient will  follow-up with Dr. Harl Bowie next month  Consideration for cardiology to do additional testing we will leave this to them  Patient mentions also nausea intermittent epigastric discomfort-we will check CBC to make sure patient not getting anemic look at pancreatic enzymes recent liver enzymes look good ultrasound abdomen recommended-await the results of these tests may need further intervention  Follow-up later in the fall sooner problems

## 2015-08-25 LAB — CBC WITH DIFFERENTIAL/PLATELET
BASOS: 1 %
Basophils Absolute: 0.1 10*3/uL (ref 0.0–0.2)
EOS (ABSOLUTE): 0.2 10*3/uL (ref 0.0–0.4)
EOS: 2 %
HEMATOCRIT: 43.8 % (ref 34.0–46.6)
Hemoglobin: 14.5 g/dL (ref 11.1–15.9)
IMMATURE GRANS (ABS): 0 10*3/uL (ref 0.0–0.1)
IMMATURE GRANULOCYTES: 0 %
LYMPHS: 35 %
Lymphocytes Absolute: 2.4 10*3/uL (ref 0.7–3.1)
MCH: 29.1 pg (ref 26.6–33.0)
MCHC: 33.1 g/dL (ref 31.5–35.7)
MCV: 88 fL (ref 79–97)
MONOS ABS: 0.7 10*3/uL (ref 0.1–0.9)
Monocytes: 10 %
NEUTROS PCT: 52 %
Neutrophils Absolute: 3.5 10*3/uL (ref 1.4–7.0)
Platelets: 388 10*3/uL — ABNORMAL HIGH (ref 150–379)
RBC: 4.98 x10E6/uL (ref 3.77–5.28)
RDW: 15.5 % — ABNORMAL HIGH (ref 12.3–15.4)
WBC: 6.8 10*3/uL (ref 3.4–10.8)

## 2015-08-25 LAB — LIPASE: Lipase: 27 U/L (ref 0–59)

## 2015-08-27 ENCOUNTER — Other Ambulatory Visit: Payer: Self-pay

## 2015-08-27 DIAGNOSIS — R109 Unspecified abdominal pain: Secondary | ICD-10-CM

## 2015-09-01 ENCOUNTER — Ambulatory Visit (HOSPITAL_COMMUNITY)
Admission: RE | Admit: 2015-09-01 | Discharge: 2015-09-01 | Disposition: A | Discharge status: Home / Self Care | Payer: Medicare Other | Source: Ambulatory Visit | Attending: Family Medicine | Admitting: Family Medicine

## 2015-09-01 DIAGNOSIS — R109 Unspecified abdominal pain: Secondary | ICD-10-CM | POA: Diagnosis not present

## 2015-09-01 DIAGNOSIS — N2889 Other specified disorders of kidney and ureter: Secondary | ICD-10-CM | POA: Diagnosis not present

## 2015-09-01 DIAGNOSIS — R932 Abnormal findings on diagnostic imaging of liver and biliary tract: Secondary | ICD-10-CM | POA: Diagnosis not present

## 2015-09-01 DIAGNOSIS — R1013 Epigastric pain: Secondary | ICD-10-CM | POA: Diagnosis not present

## 2015-09-09 ENCOUNTER — Encounter: Payer: Self-pay | Admitting: Family Medicine

## 2015-09-14 ENCOUNTER — Other Ambulatory Visit: Payer: Self-pay | Admitting: Family Medicine

## 2015-09-22 ENCOUNTER — Encounter: Payer: Self-pay | Admitting: Cardiology

## 2015-09-22 ENCOUNTER — Ambulatory Visit (INDEPENDENT_AMBULATORY_CARE_PROVIDER_SITE_OTHER): Payer: Medicare Other | Admitting: Cardiology

## 2015-09-22 VITALS — Ht 60.0 in | Wt 168.0 lb

## 2015-09-22 DIAGNOSIS — R0789 Other chest pain: Secondary | ICD-10-CM | POA: Diagnosis not present

## 2015-09-22 DIAGNOSIS — R002 Palpitations: Secondary | ICD-10-CM | POA: Diagnosis not present

## 2015-09-22 DIAGNOSIS — E785 Hyperlipidemia, unspecified: Secondary | ICD-10-CM

## 2015-09-22 DIAGNOSIS — R0602 Shortness of breath: Secondary | ICD-10-CM | POA: Diagnosis not present

## 2015-09-22 DIAGNOSIS — R079 Chest pain, unspecified: Secondary | ICD-10-CM

## 2015-09-22 NOTE — Progress Notes (Signed)
Clinical Summary Ms. Alli is a 75 y.o.female seen today for follow up of the following medical problems.   1. 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  - underwent left arm thromboembolectomy 12/19/12 by Dr Adele Barthel  - echo was negative for thrombus. There was some question of possible aortic arch atherosclerosis. Discharged on coumadin. - denies any bleeding issues on coumadin. Notes some occsaional pain in left arm with activity that is unchanged..  - has not been in favor of NOACs  - denies any bleeding troubles on coumadin. Recent left arm pain at site of previous embolic event for the last few weeks.  - she completed an US of the LUE since last visit that was normal    2. Palpitations - previous event monitor shows symptoms correlate with NSR.  - reports just mild occasional symtpsom    3. Chest pain - episodes of chest pain last week. Burning feeling midchest, occasional palpitations, can have some SOB   4. Hyperlipidemia - 11/2014 TC 233 TG 300 HDL 38 LDL 135 - has upcoming labs with pcp, she is taking crestor 3 days a week due to side effects. Followed closely by Dr Wolfgang Phoenix.   5. Fatigue - Toprol decreased to 25mg  recently by pcp - nausea improved with lower dose of beta blocker - Orthostatic symptoms with standing.  - in AM Dr pepper 12 oz, in afternoon glass of tea.  Past Medical History  Diagnosis Date  . Hyperlipidemia   . Palpitations   . Anxiety   . Fibromyalgia   . GERD (gastroesophageal reflux disease)      Allergies  Allergen Reactions  . Codeine     REACTION: Adverse gastrointestinal effects  . Statins     Felt bad     Current Outpatient Prescriptions  Medication Sig Dispense Refill  . acetaminophen (TYLENOL) 500 MG tablet Take 1,000 mg by mouth once as needed. For headache/pain    . calcium carbonate (OS-CAL) 600 MG TABS Take 600 mg by mouth  daily.      [name] latanoprost (XALATAN) 0.005 % ophthalmic solution     . LORazepam (ATIVAN) 1 MG tablet Take one tablet at noon and one tablet at bedtime. 75 tablet 4  . metoprolol succinate (TOPROL-XL) 25 MG 24 hr tablet Take 1 tablet (25 mg total) by mouth daily. Take with or immediately following a meal. 90 tablet 2  . ondansetron (ZOFRAN-ODT) 8 MG disintegrating tablet Take 1 tablet (8 mg total) by mouth every 8 (eight) hours as needed for nausea or vomiting. 30 tablet 0  . pantoprazole (PROTONIX) 40 MG tablet Take 1 tablet (40 mg total) by mouth 2 (two) times daily. 180 tablet 1  . PARoxetine (PAXIL) 20 MG tablet Take 1 tablet in the morning, and 2 tablets in the evening by mouth. 270 tablet 2  . sucralfate (CARAFATE) 1 g tablet TAKE 1 TABLET BY MOUTH BEFORE MEALS AND AT BEDTIME. 120 tablet 0  . warfarin (COUMADIN) 5 MG tablet Take 1/2 tablet daily except 1 tablet on Mondays and Thursdays 30 tablet 3   No current facility-administered medications for this visit.     Past Surgical History  Procedure Laterality Date  . Embolectomy Left 12/19/2012    Procedure: EMBOLECTOMY OF LEFT BRACHIAL ARTERY;  Surgeon: Conrad Ridge, MD;  Location: [facility];  Service: Vascular;  Laterality: Left;  [name] Eye surgery    .  Colonoscopy N/A 04/01/2015    Procedure: COLONOSCOPY;  Surgeon: Rogene Houston, MD;  [facility];  Service: Endoscopy;  Laterality: N/A;  1:45     Allergies  Allergen Reactions  . Codeine     REACTION: Adverse gastrointestinal effects  . Statins     Felt bad      Family History  Problem Relation Age of Onset  . Cancer Mother     ovarian  . Heart disease Mother      Social History Ms. Pranke reports that she has never smoked. She has never used smokeless tobacco. Ms. Clementi reports that she does not drink alcohol.   Review of Systems CONSTITUTIONAL: No weight loss, fever, chills, weakness or fatigue.  HEENT: Eyes: No visual loss, blurred vision, double  vision or yellow sclerae.No hearing loss, sneezing, congestion, runny nose or sore throat.  SKIN: No rash or itching.  CARDIOVASCULAR: per HPI RESPIRATORY: No shortness of breath, cough or sputum.  GASTROINTESTINAL: No anorexia, nausea, vomiting or diarrhea. No abdominal pain or blood.  GENITOURINARY: No burning on urination, no polyuria NEUROLOGICAL: No headache, dizziness, syncope, paralysis, ataxia, numbness or tingling in the extremities. No change in bowel or bladder control.  MUSCULOSKELETAL: No muscle, back pain, joint pain or stiffness.  LYMPHATICS: No enlarged nodes. No history of splenectomy.  PSYCHIATRIC: No history of depression or anxiety.  ENDOCRINOLOGIC: No reports of sweating, cold or heat intolerance. No polyuria or polydipsia.  [name]   Physical Examination There were no vitals filed for this visit. Filed Vitals:   09/22/15 1130  Height: 5' (1.524 m)  Weight: 168 lb (76.204 kg)    Gen: resting comfortably, no acute distress HEENT: no scleral icterus, pupils equal round and reactive, no palptable cervical adenopathy,  CV: RRR, no m/r/g, no jvd Resp: Clear to auscultation bilaterally GI: abdomen is soft, non-tender, non-distended, normal bowel sounds, no hepatosplenomegaly MSK: extremities are warm, no edema.  Skin: warm, no rash Neuro:  no focal deficits Psych: appropriate affect   Diagnostic Studies 05/2013 21 day Event monitor No arrythmias  07/2015 echo Study Conclusions  - Left ventricle: The cavity size was normal. Wall thickness was  increased in a pattern of mild LVH. Systolic function was normal.  The estimated ejection fraction was in the range of 60% to 65%.  Wall motion was normal; there were no regional wall motion  abnormalities. Doppler parameters are consistent with abnormal  left ventricular relaxation (grade 1 diastolic dysfunction). - Aortic valve: Mildly calcified annulus. Trileaflet; mildly  thickened leaflets. Valve area (VTI): 2.21  cm^2. Valve area  (Vmax): 2.35 cm^2. - Mitral valve: Mildly calcified annulus. Mildly thickened leaflets  . - Technically adequate study.  07/2015 LUE Arterial US IMPRESSION: Left upper extremity arterial noninvasive study demonstrates no evidence of significant arterial occlusive disease.     Assessment and Plan  1. Left brachial artery occlusion  - secondary to thromboembolism, unclear source of the embolism (cardiac vs aortic atheroscerlosis)  - recent pain in left upper extremity, normal [place]. Symptoms improved since last visit.   2. Palpitations - nevative event monitor 05/2013. Hx of anxiety, probable etiology - continue beta blocker  3. Chest pain /SOB - ongoing chest pain and SOB over the last few visits. Echo 07/2015 LVEF [id number], grade I diastolic dysfunction - we will plan for a lexiscan to evaluate for ischemia - cannot walk treadmill due to leg weakness.   4. Hyperlipidemia - only tolerating crestor every other day, continue current therapy -  request pcp labs   F/u 6 weeks.      Arnoldo Lenis, M.D.

## 2015-09-22 NOTE — Patient Instructions (Signed)
Medication Instructions:  Your physician recommends that you continue on your current medications as directed. Please refer to the Current Medication list given to you today.   Labwork: none  Testing/Procedures: Your physician has requested that you have a lexiscan myoview. For further information please visit [email or web address]. Please follow instruction sheet, as given.    Follow-Up: Your physician recommends that you schedule a follow-up appointment in: 6 weeks    Any Other Special Instructions Will Be Listed Below (If Applicable).     If you need a refill on your cardiac medications before your next appointment, please call your pharmacy.   

## 2015-09-28 ENCOUNTER — Ambulatory Visit (INDEPENDENT_AMBULATORY_CARE_PROVIDER_SITE_OTHER): Payer: Medicare Other | Admitting: *Deleted

## 2015-09-28 DIAGNOSIS — I82622 Acute embolism and thrombosis of deep veins of left upper extremity: Secondary | ICD-10-CM | POA: Diagnosis not present

## 2015-09-28 DIAGNOSIS — Z5181 Encounter for therapeutic drug level monitoring: Secondary | ICD-10-CM

## 2015-09-28 DIAGNOSIS — Z7901 Long term (current) use of anticoagulants: Secondary | ICD-10-CM | POA: Diagnosis not present

## 2015-09-28 LAB — POCT INR: INR: 2.4

## 2015-10-07 ENCOUNTER — Encounter (HOSPITAL_COMMUNITY): Payer: Self-pay

## 2015-10-07 ENCOUNTER — Encounter (HOSPITAL_COMMUNITY)
Admission: RE | Admit: 2015-10-07 | Discharge: 2015-10-07 | Disposition: A | Discharge status: Home / Self Care | Payer: Medicare Other | Source: Ambulatory Visit | Attending: Cardiology | Admitting: Cardiology

## 2015-10-07 ENCOUNTER — Inpatient Hospital Stay (HOSPITAL_COMMUNITY): Admission: RE | Admit: 2015-10-07 | Payer: Medicare Other | Source: Ambulatory Visit

## 2015-10-07 DIAGNOSIS — R079 Chest pain, unspecified: Secondary | ICD-10-CM | POA: Diagnosis not present

## 2015-10-07 HISTORY — DX: Essential (primary) hypertension: I10

## 2015-10-07 LAB — NM MYOCAR MULTI W/SPECT W/WALL MOTION / EF
CSEPPHR: 101 {beats}/min
LHR: 0.21
LV dias vol: 42 mL (ref 46–106)
LVSYSVOL: 15 mL
NUC STRESS TID: 1.13
Rest HR: 78 {beats}/min
SDS: 0
SRS: 1
SSS: 1

## 2015-10-07 MED ORDER — TECHNETIUM TC 99M TETROFOSMIN IV KIT
30.0000 | PACK | Freq: Once | INTRAVENOUS | Status: AC | PRN
  Administered 2015-10-07: 31 via INTRAVENOUS

## 2015-10-07 MED ORDER — TECHNETIUM TC 99M TETROFOSMIN IV KIT
10.0000 | PACK | Freq: Once | INTRAVENOUS | Status: AC | PRN
  Administered 2015-10-07: 9.7 via INTRAVENOUS

## 2015-10-07 MED ORDER — REGADENOSON 0.4 MG/5ML IV SOLN
INTRAVENOUS | Status: AC
  Administered 2015-10-07: 0.4 mg via INTRAVENOUS
  Filled 2015-10-07: qty 5

## 2015-10-07 MED ORDER — SODIUM CHLORIDE 0.9% FLUSH
INTRAVENOUS | Status: AC
  Administered 2015-10-07: 10 mL via INTRAVENOUS
  Filled 2015-10-07: qty 10

## 2015-10-27 ENCOUNTER — Other Ambulatory Visit: Payer: Self-pay | Admitting: Cardiology

## 2015-10-31 ENCOUNTER — Other Ambulatory Visit: Payer: Self-pay | Admitting: Family Medicine

## 2015-11-09 ENCOUNTER — Ambulatory Visit (INDEPENDENT_AMBULATORY_CARE_PROVIDER_SITE_OTHER): Payer: Medicare Other | Admitting: *Deleted

## 2015-11-09 DIAGNOSIS — Z7901 Long term (current) use of anticoagulants: Secondary | ICD-10-CM | POA: Diagnosis not present

## 2015-11-09 DIAGNOSIS — Z5181 Encounter for therapeutic drug level monitoring: Secondary | ICD-10-CM

## 2015-11-09 DIAGNOSIS — I82622 Acute embolism and thrombosis of deep veins of left upper extremity: Secondary | ICD-10-CM

## 2015-11-09 LAB — POCT INR: INR: 2.7

## 2015-11-11 DIAGNOSIS — H40013 Open angle with borderline findings, low risk, bilateral: Secondary | ICD-10-CM | POA: Diagnosis not present

## 2015-11-11 DIAGNOSIS — Z961 Presence of intraocular lens: Secondary | ICD-10-CM | POA: Diagnosis not present

## 2015-11-11 DIAGNOSIS — H3401 Transient retinal artery occlusion, right eye: Secondary | ICD-10-CM | POA: Diagnosis not present

## 2015-11-12 ENCOUNTER — Ambulatory Visit (INDEPENDENT_AMBULATORY_CARE_PROVIDER_SITE_OTHER): Payer: Medicare Other | Admitting: Cardiology

## 2015-11-12 ENCOUNTER — Telehealth: Payer: Self-pay | Admitting: *Deleted

## 2015-11-12 ENCOUNTER — Encounter: Payer: Self-pay | Admitting: Cardiology

## 2015-11-12 ENCOUNTER — Ambulatory Visit (HOSPITAL_COMMUNITY)
Admission: RE | Admit: 2015-11-12 | Discharge: 2015-11-12 | Disposition: A | Discharge status: Home / Self Care | Payer: Medicare Other | Source: Ambulatory Visit | Attending: Cardiology | Admitting: Cardiology

## 2015-11-12 VITALS — BP 106/70 | HR 90 | Ht 60.0 in | Wt 166.0 lb

## 2015-11-12 DIAGNOSIS — G459 Transient cerebral ischemic attack, unspecified: Secondary | ICD-10-CM | POA: Insufficient documentation

## 2015-11-12 DIAGNOSIS — H538 Other visual disturbances: Secondary | ICD-10-CM | POA: Diagnosis not present

## 2015-11-12 DIAGNOSIS — R079 Chest pain, unspecified: Secondary | ICD-10-CM

## 2015-11-12 DIAGNOSIS — R002 Palpitations: Secondary | ICD-10-CM | POA: Diagnosis not present

## 2015-11-12 DIAGNOSIS — Z7901 Long term (current) use of anticoagulants: Secondary | ICD-10-CM

## 2015-11-12 DIAGNOSIS — G319 Degenerative disease of nervous system, unspecified: Secondary | ICD-10-CM | POA: Insufficient documentation

## 2015-11-12 MED ORDER — ASPIRIN EC 81 MG PO TBEC: 81.0000 mg | DELAYED_RELEASE_TABLET | Freq: Every day | ORAL | 3 refills | Status: DC

## 2015-11-12 NOTE — Telephone Encounter (Signed)
[  11/12/2015 10:46 AM] Carlyle Dolly:  For [place] CT looks good, no evidence of stroke. Sometimes patients can have a ministroke which does not leave any permanent damage. She still needs her carotid US and also needs to see neuro, remind her to let us know who she wants to see after she speaks to her daughter.  Patient notified in office and voice understanding

## 2015-11-12 NOTE — Progress Notes (Signed)
Results called to [facility], [place], in [place]. Patient is in their waiting room waiting on the report.

## 2015-11-12 NOTE — Progress Notes (Signed)
Clinical Summary Ms. Whisenant is a 75 y.o.female seen today for follow up of the following medical problems.   1. 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  - underwent left arm thromboembolectomy 12/19/12 by Dr Adele Barthel  - echo was negative for thrombus. There was some question of possible aortic arch atherosclerosis. Discharged on coumadin. - denies any bleeding issues on coumadin. Notes some occsaional pain in left arm with activity that is unchanged..  - has not been in favor of NOACs  - denies any bleeding troubles on coumadin. No recent arm pain   2. Palpitations - previous event monitor shows symptoms correlate with NSR.  - no recent symptoms   3. Chest pain - recent episodes of chest pain last week - 10/2015 lexiscan without ischemia.  - 07/2015 echo LVEF [id number], grade I diastolic dysfunction - no recurrent symptoms since last visit  4. Hyperlipidemia -compliant with statin  5. Vision change - episode Monday morning, right sided peripheral vision loss.  - no other associated symptoms - symptoms resolved after about 5 minutes.  - seen by her eye doctor, she reports normal eye exam.   Past Medical History:  Diagnosis Date  . Anxiety   . Fibromyalgia   . GERD (gastroesophageal reflux disease)   . Hyperlipidemia   . Hypertension   . Palpitations      Allergies  Allergen Reactions  . Codeine     REACTION: Adverse gastrointestinal effects  . Statins     Felt bad     Current Outpatient Prescriptions  Medication Sig Dispense Refill  . acetaminophen (TYLENOL) 500 MG tablet Take 1,000 mg by mouth once as needed. For headache/pain    . calcium carbonate (OS-CAL) 600 MG TABS Take 600 mg by mouth daily.      [name] latanoprost (XALATAN) 0.005 % ophthalmic solution     . LORazepam (ATIVAN) 1 MG tablet Take one tablet at noon and one tablet at bedtime. 75 tablet 4  .  metoprolol succinate (TOPROL-XL) 25 MG 24 hr tablet Take 1 tablet (25 mg total) by mouth daily. Take with or immediately following a meal. 90 tablet 2  . ondansetron (ZOFRAN-ODT) 8 MG disintegrating tablet Take 1 tablet (8 mg total) by mouth every 8 (eight) hours as needed for nausea or vomiting. 30 tablet 0  . pantoprazole (PROTONIX) 40 MG tablet Take 1 tablet by mouth two  times daily 180 tablet 1  . PARoxetine (PAXIL) 20 MG tablet Take 1 tablet in the morning, and 2 tablets in the evening by mouth. 270 tablet 2  . sucralfate (CARAFATE) 1 g tablet TAKE 1 TABLET BY MOUTH BEFORE MEALS AND AT BEDTIME. 120 tablet 0  . warfarin (COUMADIN) 5 MG tablet TAKE 1/2 TABLET BY MOUTH DAILY, EXCEPT TAKE 1 TABLET BY MOUTH DAILY ON MONDAYS AND THURSDAYS. 30 tablet 4   No current facility-administered medications for this visit.      Past Surgical History:  Procedure Laterality Date  . COLONOSCOPY N/A 04/01/2015   Procedure: COLONOSCOPY;  Surgeon: Rogene Houston, MD;  Location: [facility];  Service: Endoscopy;  Laterality: N/A;  1:45  . EMBOLECTOMY Left 12/19/2012   Procedure: EMBOLECTOMY OF LEFT BRACHIAL ARTERY;  Surgeon: Conrad St. Anthony, MD;  Location: [facility];  Service: Vascular;  Laterality: Left;  . EYE SURGERY       Allergies  Allergen Reactions  . Codeine  REACTION: Adverse gastrointestinal effects  . Statins     Felt bad      Family History  Problem Relation Age of Onset  . Cancer Mother     ovarian  . Heart disease Mother      Social History Ms. Mautner reports that she has never smoked. She has never used smokeless tobacco. Ms. Hessler reports that she does not drink alcohol.   Review of Systems CONSTITUTIONAL: No weight loss, fever, chills, weakness or fatigue.  HEENT: Eyes: No visual loss, blurred vision, double vision or yellow sclerae.No hearing loss, sneezing, congestion, runny nose or sore throat.  SKIN: No rash or itching.  CARDIOVASCULAR: per HPI RESPIRATORY: No  shortness of breath, cough or sputum.  GASTROINTESTINAL: No anorexia, nausea, vomiting or diarrhea. No abdominal pain or blood.  GENITOURINARY: No burning on urination, no polyuria NEUROLOGICAL: per HPI MUSCULOSKELETAL: No muscle, back pain, joint pain or stiffness.  LYMPHATICS: No enlarged nodes. No history of splenectomy.  PSYCHIATRIC: No history of depression or anxiety.  ENDOCRINOLOGIC: No reports of sweating, cold or heat intolerance. No polyuria or polydipsia.  [name]   Physical Examination There were no vitals filed for this visit. Filed Weights   11/12/15 0834  Weight: 166 lb (75.3 kg)    Gen: resting comfortably, no acute distress HEENT: no scleral icterus, pupils equal round and reactive, no palptable cervical adenopathy,  CV: RRR, no m/r/g, no jvd Resp: Clear to auscultation bilaterally GI: abdomen is soft, non-tender, non-distended, normal bowel sounds, no hepatosplenomegaly MSK: extremities are warm, no edema.  Skin: warm, no rash Neuro:  no focal deficits Psych: appropriate affect   Diagnostic Studies 05/2013 21 day Event monitor No arrythmias  07/2015 echo Study Conclusions  - Left ventricle: The cavity size was normal. Wall thickness was  increased in a pattern of mild LVH. Systolic function was normal.  The estimated ejection fraction was in the range of 60% to 65%.  Wall motion was normal; there were no regional wall motion  abnormalities. Doppler parameters are consistent with abnormal  left ventricular relaxation (grade 1 diastolic dysfunction). - Aortic valve: Mildly calcified annulus. Trileaflet; mildly  thickened leaflets. Valve area (VTI): 2.21 cm^2. Valve area  (Vmax): 2.35 cm^2. - Mitral valve: Mildly calcified annulus. Mildly thickened leaflets  . - Technically adequate study.  07/2015 LUE Arterial US IMPRESSION: Left upper extremity arterial noninvasive study demonstrates no evidence of significant arterial occlusive  disease.  10/2015 Lexiscan MPI  There was no ST segment deviation noted during stress.  The study is normal. There are no perfusion defects consistent with prior infarct or current ischemia.  This is a low risk study.  The left ventricular ejection fraction is normal (55-65%).  Assessment and Plan  1. Left brachial artery occlusion  - secondary to thromboembolism, unclear source of the embolism (cardiac vs aortic atheroscerlosis)  - no recent symptoms, continue anticoag  2. Palpitations - nevative event monitor 05/2013. Hx of anxiety, probable etiology - no recent symptoms, continue ot monitor  3. Chest pain /SOB - negative stress test recently, symptoms have resolved - continue to monitor   4. Hyperlipidemia - only tolerating crestor every other day, continue  5. Vision change/TIA - transient loss of right eye peripheral vision, concerning for TIA - we will obtain CT head today along with carotid US. Refer to neurology - episode occurred on coumadin, we will add ASA 81mg  for the time being.     F/u 4 months      Roderic Palau  Dale Athens, M.D.

## 2015-11-12 NOTE — Patient Instructions (Signed)
Your physician recommends that you schedule a follow-up appointment in: 4 Months with Dr. Harl Bowie   Your physician has recommended you make the following change in your medication:  Start Aspirin 81 mg Daily   Your physician has requested that you have a carotid duplex. This test is an ultrasound of the carotid arteries in your neck. It looks at blood flow through these arteries that supply the brain with blood. Allow one hour for this exam. There are no restrictions or special instructions.  Have CT scan of head done today.   Please call our office with the name of neurologist you would like to call.   If you need a refill on your cardiac medications before your next appointment, please call your pharmacy.  Thank you for choosing [facility]!

## 2015-11-13 ENCOUNTER — Ambulatory Visit (HOSPITAL_COMMUNITY)
Admission: RE | Admit: 2015-11-13 | Discharge: 2015-11-13 | Disposition: A | Discharge status: Home / Self Care | Payer: Medicare Other | Source: Ambulatory Visit | Attending: Cardiology | Admitting: Cardiology

## 2015-11-13 DIAGNOSIS — G459 Transient cerebral ischemic attack, unspecified: Secondary | ICD-10-CM | POA: Diagnosis not present

## 2015-11-13 DIAGNOSIS — I6522 Occlusion and stenosis of left carotid artery: Secondary | ICD-10-CM | POA: Diagnosis not present

## 2015-11-24 ENCOUNTER — Ambulatory Visit (INDEPENDENT_AMBULATORY_CARE_PROVIDER_SITE_OTHER): Payer: Medicare Other | Admitting: Family Medicine

## 2015-11-24 ENCOUNTER — Encounter: Payer: Self-pay | Admitting: Family Medicine

## 2015-11-24 VITALS — BP 110/74 | Ht 61.0 in | Wt 167.4 lb

## 2015-11-24 DIAGNOSIS — R0609 Other forms of dyspnea: Secondary | ICD-10-CM | POA: Diagnosis not present

## 2015-11-24 DIAGNOSIS — R059 Cough, unspecified: Secondary | ICD-10-CM

## 2015-11-24 DIAGNOSIS — R05 Cough: Secondary | ICD-10-CM | POA: Diagnosis not present

## 2015-11-24 NOTE — Progress Notes (Signed)
   Subjective:    Patient ID: Jacqueline Morgan, female    DOB: Jun 21, 1940, 75 y.o.   MRN: [id number]  Hyperlipidemia  This is a chronic problem. The current episode started more than 1 year ago. There are no known factors aggravating her hyperlipidemia. Pertinent negatives include no chest pain or shortness of breath. Current antihyperlipidemic treatment includes diet change. The current treatment provides moderate improvement of lipids. There are no compliance problems.  There are no known risk factors for coronary artery disease.   Patient states that she has fatigue. She would like to discuss this today.  She relates significant fatigue in getting out of breath with activity she is been seen by cardiology. They did do an echo. They also did a CT scan of the head and Doppler of the carotids  She is seen urology in the near future for a possible TIA issue with her right eye  Review of Systems  Constitutional: Negative for activity change and fever.  HENT: Positive for congestion. Negative for ear pain and rhinorrhea.   Eyes: Negative for discharge.  Respiratory: Positive for cough. Negative for shortness of breath and wheezing.   Cardiovascular: Negative for chest pain.       Objective:   Physical Exam  Constitutional: She appears well-developed.  HENT:  Head: Normocephalic.  Nose: Nose normal.  Mouth/Throat: Oropharynx is clear and moist. No oropharyngeal exudate.  Neck: Neck supple.  Cardiovascular: Normal rate and normal heart sounds.   No murmur heard. Pulmonary/Chest: Effort normal and breath sounds normal. She has no wheezes.  Lymphadenopathy:    She has no cervical adenopathy.  Skin: Skin is warm and dry.  Nursing note and vitals reviewed.         Assessment & Plan:  Patient with prolonged cough. I truly don't feel this is an infection occasionally has discolored phlegm I don't recommend antibiotics currently if progressively worse notify us  We will be doing  pulmonary function testing to look for the possibility of underlying COPD  Significant anxiety related issue patient does not one to go through any counseling. She is agreeing to continue her medications. She states it helps  Patient relates severe reflux and heartburn unless she takes her medication.  She denies any bleeding issues  Follow-up in a proximally 4 months

## 2015-11-24 NOTE — Patient Instructions (Signed)
Lorazepam - you may take 1/2 tablet twice a day and one full tablet in the evening as needed for anxiety  Continue the Paxil 20 mg taking 3 daily  Recheck here in 4 months

## 2015-11-30 ENCOUNTER — Encounter: Payer: Self-pay | Admitting: Neurology

## 2015-11-30 ENCOUNTER — Ambulatory Visit (INDEPENDENT_AMBULATORY_CARE_PROVIDER_SITE_OTHER): Payer: Medicare Other | Admitting: Neurology

## 2015-11-30 VITALS — BP 118/74 | HR 80 | Ht 61.0 in | Wt 167.0 lb

## 2015-11-30 DIAGNOSIS — H5310 Unspecified subjective visual disturbances: Secondary | ICD-10-CM | POA: Diagnosis not present

## 2015-11-30 DIAGNOSIS — G250 Essential tremor: Secondary | ICD-10-CM

## 2015-11-30 HISTORY — DX: Essential tremor: G25.0

## 2015-11-30 NOTE — Progress Notes (Signed)
Reason for visit: Visual disturbance  Referring physician: Dr. Evlyn Courier Jacqueline Morgan is a 75 y.o. female  History of present illness:  Jacqueline Morgan is a 75 year old right-handed white female with a history of a left brachial artery occlusion in September 2014. The patient has been placed on Coumadin following an embolectomy. She has remained on Coumadin without difficulty until around 11/09/2015. The patient was working on the computer at the time and she noted that the right side of the visual field was altered. The patient indicated that she covered one eye and then the other and only the right eye was affected, but the visual disturbance respected the vertical plane. The patient had no other associated symptoms such as speech disturbance, numbness or weakness, headache, or gait disturbance. The vision alteration last about 5 minutes and then cleared, she has not had any episodes prior to or since August 7. The patient denies any issues with headaches, she does report some dizziness off and on. She has undergone a carotid Doppler study that was unremarkable. A CT the head was unremarkable. She was seen by her ophthalmologist and no intraocular disease was noted. A recent 2-D echocardiogram of the heart was done in April 2017 and was unremarkable. The patient also has a history of an essential tremor. This has gradually worsened over time, her mother has a similar tremor. The patient comes to this office for an evaluation.  Past Medical History:  Diagnosis Date  . Anxiety   . Fibromyalgia   . GERD (gastroesophageal reflux disease)   . Hyperlipidemia   . Hypertension   . Palpitations     Past Surgical History:  Procedure Laterality Date  . COLONOSCOPY N/A 04/01/2015   Procedure: COLONOSCOPY;  Surgeon: Rogene Houston, MD;  [facility];  Service: Endoscopy;  Laterality: N/A;  1:45  . EMBOLECTOMY Left 12/19/2012   Procedure: EMBOLECTOMY OF LEFT BRACHIAL ARTERY;  Surgeon:  Conrad Fostoria, MD;  Location: [facility];  Service: Vascular;  Laterality: Left;  . EYE SURGERY      Family History  Problem Relation Age of Onset  . Cancer Mother     ovarian  . Heart disease Mother     Social history:  reports that she has never smoked. She has never used smokeless tobacco. She reports that she does not drink alcohol or use drugs.  Medications:  Prior to Admission medications   Medication Sig Start Date End Date Taking? Authorizing Provider  acetaminophen (TYLENOL) 500 MG tablet Take 1,000 mg by mouth once as needed. For headache/pain    Historical Provider, MD  aspirin EC 81 MG tablet Take 1 tablet (81 mg total) by mouth daily. 11/12/15   Arnoldo Lenis, MD  calcium carbonate (OS-CAL) 600 MG TABS Take 600 mg by mouth daily.      Historical Provider, MD  latanoprost (XALATAN) 0.005 % ophthalmic solution  05/16/13   Historical Provider, MD  LORazepam (ATIVAN) 1 MG tablet Take one tablet at noon and one tablet at bedtime. 07/13/15   Kathyrn Drown, MD  metoprolol succinate (TOPROL-XL) 25 MG 24 hr tablet Take 1 tablet (25 mg total) by mouth daily. Take with or immediately following a meal. 08/24/15   Kathyrn Drown, MD  ondansetron (ZOFRAN-ODT) 8 MG disintegrating tablet Take 1 tablet (8 mg total) by mouth every 8 (eight) hours as needed for nausea or vomiting. 01/12/15   Kathyrn Drown, MD  pantoprazole (PROTONIX) 40 MG tablet Take  1 tablet by mouth two  times daily 11/02/15   Kathyrn Drown, MD  PARoxetine (PAXIL) 20 MG tablet Take 1 tablet in the morning, and 2 tablets in the evening by mouth. 08/24/15   Kathyrn Drown, MD  sucralfate (CARAFATE) 1 g tablet TAKE 1 TABLET BY MOUTH BEFORE MEALS AND AT BEDTIME. 09/14/15   Mikey Kirschner, MD  warfarin (COUMADIN) 5 MG tablet TAKE 1/2 TABLET BY MOUTH DAILY, EXCEPT TAKE 1 TABLET BY MOUTH DAILY ON MONDAYS AND THURSDAYS. 10/28/15   Arnoldo Lenis, MD      Allergies  Allergen Reactions  . Codeine     REACTION: Adverse  gastrointestinal effects  . Statins     Felt bad    ROS:  Out of a complete 14 system review of symptoms, the patient complains only of the following symptoms, and all other reviewed systems are negative.  Fatigue Palpitations of the heart Ringing in the ears, spinning sensations Birthmark, moles Blurred vision Shortness of breath, cough Increased thirst Joint pain, muscle cramps, aching muscles Headache, dizziness, tremor Depression, anxiety, decreased energy, disinterest in activities Snoring  There were no vitals taken for this visit.  Physical Exam  General: The patient is alert and cooperative at the time of the examination. The patient is moderately obese.  Eyes: Pupils are equal, round, and reactive to light. Discs are flat bilaterally.  Neck: The neck is supple, no carotid bruits are noted.  Respiratory: The respiratory examination is clear.  Cardiovascular: The cardiovascular examination reveals a regular rate and rhythm, no obvious murmurs or rubs are noted.  Skin: Extremities are without significant edema.  Neurologic Exam  Mental status: The patient is alert and oriented x 3 at the time of the examination. The patient has apparent normal recent and remote memory, with an apparently normal attention span and concentration ability.  Cranial nerves: Facial symmetry is present. There is good sensation of the face to pinprick and soft touch bilaterally. The strength of the facial muscles and the muscles to head turning and shoulder shrug are normal bilaterally. Speech is well enunciated, no aphasia or dysarthria is noted. Extraocular movements are full. Visual fields are full. The tongue is midline, and the patient has symmetric elevation of the soft palate. No obvious hearing deficits are noted.  Motor: The motor testing reveals 5 over 5 strength of all 4 extremities. Good symmetric motor tone is noted throughout.  Sensory: Sensory testing is intact to pinprick,  soft touch, vibration sensation, and position sense on all 4 extremities. No evidence of extinction is noted.  Coordination: Cerebellar testing reveals good finger-nose-finger and heel-to-shin bilaterally. The patient has an intention tremor with the upper extremities bilaterally, some jaw tremor is seen.  Gait and station: Gait is normal. Tandem gait is normal. Romberg is negative. No drift is seen.  Reflexes: Deep tendon reflexes are symmetric and normal bilaterally. Toes are downgoing bilaterally.   CT head 11/12/15:  IMPRESSION: Minimal atrophy and minimal small vessel ischemic change. No acute intracranial abnormality.  * CT scan images were reviewed online. I agree with the written report.   Carotid doppler 11/13/15:  IMPRESSION: 1. No significant atherosclerotic plaque or evidence of stenosis in the right internal carotid artery. 2. Mild (1-49%) stenosis proximal left internal carotid artery secondary to smooth heterogeneous atherosclerotic plaque. 3. Vertebral arteries are patent with normal antegrade flow.   2D echo 07/16/15:  Study Conclusions  - Left ventricle: The cavity size was normal. Wall thickness was  increased in a pattern of mild LVH. Systolic function was normal.   The estimated ejection fraction was in the range of 60% to 65%.   Wall motion was normal; there were no regional wall motion   abnormalities. Doppler parameters are consistent with abnormal   left ventricular relaxation (grade 1 diastolic dysfunction). - Aortic valve: Mildly calcified annulus. Trileaflet; mildly   thickened leaflets. Valve area (VTI): 2.21 cm^2. Valve area   (Vmax): 2.35 cm^2. - Mitral valve: Mildly calcified annulus. Mildly thickened leaflets   . - Technically adequate study.    Assessment/Plan:  1. Essential tremor  2. Transient visual disturbance  The patient likely had a transient right homonymous visual field deficit that cleared in 5 minutes, consistent with a  TIA event. The patient is on maximal medical therapy this time, she has had aspirin recently added to the Coumadin therapy. We have discussed placing her on other medications for her tremor, she does not wish to go on any other new medications. She will follow-up through this office on an as-needed basis. We will check a sedimentation rate today.  Jill Alexanders MD 11/30/2015 2:00 PM  [facility] [address] [place], [place] [address]  Phone [phone] Fax [phone]

## 2015-12-01 LAB — SEDIMENTATION RATE: SED RATE: 35 mm/h (ref 0–40)

## 2015-12-04 ENCOUNTER — Ambulatory Visit (HOSPITAL_COMMUNITY)
Admission: RE | Admit: 2015-12-04 | Discharge: 2015-12-04 | Disposition: A | Discharge status: Home / Self Care | Payer: Medicare Other | Source: Ambulatory Visit | Attending: Family Medicine | Admitting: Family Medicine

## 2015-12-04 DIAGNOSIS — R0609 Other forms of dyspnea: Secondary | ICD-10-CM | POA: Insufficient documentation

## 2015-12-04 DIAGNOSIS — R05 Cough: Secondary | ICD-10-CM | POA: Diagnosis not present

## 2015-12-04 LAB — PULMONARY FUNCTION TEST
DL/VA % PRED: 97 %
DL/VA: 4.12 ml/min/mmHg/L
DLCO unc % pred: 76 %
DLCO unc: 14.52 ml/min/mmHg
FEF 25-75 Post: 3.35 L/sec
FEF 25-75 Pre: 3.1 L/sec
FEF2575-%Change-Post: 7 %
FEF2575-%Pred-Post: 232 %
FEF2575-%Pred-Pre: 215 %
FEV1-%CHANGE-POST: 2 %
FEV1-%PRED-PRE: 126 %
FEV1-%Pred-Post: 130 %
FEV1-PRE: 2.22 L
FEV1-Post: 2.28 L
FEV1FVC-%Change-Post: 2 %
FEV1FVC-%Pred-Pre: 116 %
FEV6-%Change-Post: 0 %
FEV6-%PRED-PRE: 113 %
FEV6-%Pred-Post: 114 %
FEV6-POST: 2.55 L
FEV6-Pre: 2.53 L
FEV6FVC-%Change-Post: 0 %
FEV6FVC-%PRED-POST: 106 %
FEV6FVC-%PRED-PRE: 105 %
FVC-%Change-Post: 0 %
FVC-%PRED-PRE: 108 %
FVC-%Pred-Post: 108 %
FVC-POST: 2.55 L
FVC-PRE: 2.54 L
POST FEV6/FVC RATIO: 100 %
PRE FEV6/FVC RATIO: 99 %
Post FEV1/FVC ratio: 89 %
Pre FEV1/FVC ratio: 87 %
RV % PRED: 77 %
RV: 1.62 L
TLC % PRED: 95 %
TLC: 4.25 L

## 2015-12-04 MED ORDER — ALBUTEROL SULFATE (2.5 MG/3ML) 0.083% IN NEBU
2.5000 mg | INHALATION_SOLUTION | Freq: Once | RESPIRATORY_TRACT | Status: AC
  Administered 2015-12-04: 2.5 mg via RESPIRATORY_TRACT

## 2015-12-21 ENCOUNTER — Ambulatory Visit (INDEPENDENT_AMBULATORY_CARE_PROVIDER_SITE_OTHER): Payer: Medicare Other | Admitting: *Deleted

## 2015-12-21 DIAGNOSIS — I82622 Acute embolism and thrombosis of deep veins of left upper extremity: Secondary | ICD-10-CM

## 2015-12-21 DIAGNOSIS — Z7901 Long term (current) use of anticoagulants: Secondary | ICD-10-CM

## 2015-12-21 DIAGNOSIS — Z5181 Encounter for therapeutic drug level monitoring: Secondary | ICD-10-CM

## 2015-12-21 DIAGNOSIS — I82621 Acute embolism and thrombosis of deep veins of right upper extremity: Secondary | ICD-10-CM | POA: Diagnosis not present

## 2015-12-21 LAB — POCT INR: INR: 2.7

## 2015-12-23 DIAGNOSIS — Z961 Presence of intraocular lens: Secondary | ICD-10-CM | POA: Diagnosis not present

## 2015-12-23 DIAGNOSIS — H3401 Transient retinal artery occlusion, right eye: Secondary | ICD-10-CM | POA: Diagnosis not present

## 2015-12-23 DIAGNOSIS — H40013 Open angle with borderline findings, low risk, bilateral: Secondary | ICD-10-CM | POA: Diagnosis not present

## 2016-01-18 ENCOUNTER — Other Ambulatory Visit: Payer: Self-pay | Admitting: Family Medicine

## 2016-01-18 NOTE — Telephone Encounter (Signed)
May have this and 3 refills 

## 2016-02-01 ENCOUNTER — Ambulatory Visit (INDEPENDENT_AMBULATORY_CARE_PROVIDER_SITE_OTHER): Payer: Medicare Other | Admitting: *Deleted

## 2016-02-01 DIAGNOSIS — Z5181 Encounter for therapeutic drug level monitoring: Secondary | ICD-10-CM

## 2016-02-01 DIAGNOSIS — I82621 Acute embolism and thrombosis of deep veins of right upper extremity: Secondary | ICD-10-CM

## 2016-02-01 DIAGNOSIS — I82622 Acute embolism and thrombosis of deep veins of left upper extremity: Secondary | ICD-10-CM

## 2016-02-01 DIAGNOSIS — Z7901 Long term (current) use of anticoagulants: Secondary | ICD-10-CM | POA: Diagnosis not present

## 2016-02-01 LAB — POCT INR: INR: 2.1

## 2016-03-09 ENCOUNTER — Ambulatory Visit: Payer: Medicare Other | Admitting: Family Medicine

## 2016-03-10 ENCOUNTER — Ambulatory Visit (INDEPENDENT_AMBULATORY_CARE_PROVIDER_SITE_OTHER): Payer: Medicare Other | Admitting: Family Medicine

## 2016-03-10 ENCOUNTER — Encounter: Payer: Self-pay | Admitting: Family Medicine

## 2016-03-10 VITALS — BP 114/80 | Ht 61.0 in | Wt 165.0 lb

## 2016-03-10 DIAGNOSIS — E784 Other hyperlipidemia: Secondary | ICD-10-CM

## 2016-03-10 DIAGNOSIS — E7849 Other hyperlipidemia: Secondary | ICD-10-CM

## 2016-03-10 DIAGNOSIS — R5383 Other fatigue: Secondary | ICD-10-CM

## 2016-03-10 DIAGNOSIS — K219 Gastro-esophageal reflux disease without esophagitis: Secondary | ICD-10-CM

## 2016-03-10 DIAGNOSIS — I10 Essential (primary) hypertension: Secondary | ICD-10-CM | POA: Diagnosis not present

## 2016-03-10 MED ORDER — MIRTAZAPINE 15 MG PO TABS: 7.5000 mg | ORAL_TABLET | Freq: Every day | ORAL | 0 refills | Status: DC

## 2016-03-10 NOTE — Progress Notes (Signed)
   Subjective:    Patient ID: Jacqueline Morgan, female    DOB: 12/24/1940, 75 y.o.   MRN: [id number]  Anxiety  Presents for follow-up visit. Patient reports no chest pain or confusion. The quality of sleep is poor. Nighttime awakenings: none.   Compliance with medications is 76-100%.   Patient states that her daughter says she has isolated herself from people.  Patient goes 1-2 days without any sleep sometimes.  Patient with history of hypertension Tries watch her diet Also has hyperlipidemia but does not tolerate statins History of panic attacks Has been suffering with anxiousness and depression does not want to see specialist does not want counseling denies being suicidal    Review of Systems  Constitutional: Negative for activity change, appetite change and fatigue.  HENT: Negative for congestion.   Respiratory: Negative for cough.   Cardiovascular: Negative for chest pain.  Gastrointestinal: Negative for abdominal pain.  Endocrine: Negative for polydipsia and polyphagia.  Neurological: Negative for weakness.  Psychiatric/Behavioral: Negative for confusion.       Objective:   Physical Exam  Constitutional: She appears well-nourished. No distress.  Cardiovascular: Normal rate, regular rhythm and normal heart sounds.   No murmur heard. Pulmonary/Chest: Effort normal and breath sounds normal. No respiratory distress.  Musculoskeletal: She exhibits no edema.  Lymphadenopathy:    She has no cervical adenopathy.  Neurological: She is alert. She exhibits normal muscle tone.  Psychiatric: Her behavior is normal.  Vitals reviewed.  Mild fatigue probably related to lack of activity   Lab work ordered await results    Assessment & Plan:  Hyperlipidemia patient is not interested in being on any statin. Patient refuses flu vaccine and pneumonia vaccine Patient with anxiety related and depression related symptoms add additional medicine to try to help with sleep at night  encourage patient for counseling patient refuses Patient denies being suicidal Hypertension decent control continue current measures On anticoagulant per cardiology no bleeding problems

## 2016-03-14 ENCOUNTER — Ambulatory Visit (INDEPENDENT_AMBULATORY_CARE_PROVIDER_SITE_OTHER): Payer: Medicare Other | Admitting: *Deleted

## 2016-03-14 DIAGNOSIS — I82622 Acute embolism and thrombosis of deep veins of left upper extremity: Secondary | ICD-10-CM | POA: Diagnosis not present

## 2016-03-14 DIAGNOSIS — I82621 Acute embolism and thrombosis of deep veins of right upper extremity: Secondary | ICD-10-CM | POA: Diagnosis not present

## 2016-03-14 DIAGNOSIS — Z7901 Long term (current) use of anticoagulants: Secondary | ICD-10-CM

## 2016-03-14 DIAGNOSIS — Z5181 Encounter for therapeutic drug level monitoring: Secondary | ICD-10-CM | POA: Diagnosis not present

## 2016-03-14 LAB — POCT INR: INR: 2.7

## 2016-03-23 ENCOUNTER — Ambulatory Visit: Payer: Medicare Other | Admitting: Cardiology

## 2016-03-29 ENCOUNTER — Other Ambulatory Visit: Payer: Self-pay | Admitting: Cardiology

## 2016-04-25 ENCOUNTER — Ambulatory Visit (INDEPENDENT_AMBULATORY_CARE_PROVIDER_SITE_OTHER): Payer: Medicare Other | Admitting: *Deleted

## 2016-04-25 DIAGNOSIS — Z5181 Encounter for therapeutic drug level monitoring: Secondary | ICD-10-CM | POA: Diagnosis not present

## 2016-04-25 DIAGNOSIS — Z7901 Long term (current) use of anticoagulants: Secondary | ICD-10-CM | POA: Diagnosis not present

## 2016-04-25 DIAGNOSIS — I82622 Acute embolism and thrombosis of deep veins of left upper extremity: Secondary | ICD-10-CM

## 2016-04-25 LAB — POCT INR: INR: 3

## 2016-05-09 ENCOUNTER — Encounter: Payer: Self-pay | Admitting: Family Medicine

## 2016-05-09 ENCOUNTER — Ambulatory Visit (INDEPENDENT_AMBULATORY_CARE_PROVIDER_SITE_OTHER): Payer: Medicare Other | Admitting: Family Medicine

## 2016-05-09 ENCOUNTER — Other Ambulatory Visit (HOSPITAL_COMMUNITY)
Admission: RE | Admit: 2016-05-09 | Discharge: 2016-05-09 | Disposition: A | Discharge status: Home / Self Care | Payer: Medicare Other | Source: Ambulatory Visit | Attending: Family Medicine | Admitting: Family Medicine

## 2016-05-09 VITALS — BP 104/78 | Temp 98.4°F | Ht 61.0 in | Wt 160.5 lb

## 2016-05-09 DIAGNOSIS — R1013 Epigastric pain: Secondary | ICD-10-CM | POA: Diagnosis not present

## 2016-05-09 LAB — HEPATIC FUNCTION PANEL
ALT: 21 U/L (ref 14–54)
AST: 33 U/L (ref 15–41)
Albumin: 3.9 g/dL (ref 3.5–5.0)
Alkaline Phosphatase: 99 U/L (ref 38–126)
BILIRUBIN DIRECT: 0.1 mg/dL (ref 0.1–0.5)
BILIRUBIN INDIRECT: 0.4 mg/dL (ref 0.3–0.9)
BILIRUBIN TOTAL: 0.5 mg/dL (ref 0.3–1.2)
Total Protein: 8.2 g/dL — ABNORMAL HIGH (ref 6.5–8.1)

## 2016-05-09 LAB — CBC WITH DIFFERENTIAL/PLATELET
BASOS PCT: 1 %
Basophils Absolute: 0.1 10*3/uL (ref 0.0–0.1)
Eosinophils Absolute: 0.2 10*3/uL (ref 0.0–0.7)
Eosinophils Relative: 2 %
HEMATOCRIT: 44.3 % (ref 36.0–46.0)
Hemoglobin: 15 g/dL (ref 12.0–15.0)
LYMPHS PCT: 34 %
Lymphs Abs: 3.2 10*3/uL (ref 0.7–4.0)
MCH: 29.6 pg (ref 26.0–34.0)
MCHC: 33.9 g/dL (ref 30.0–36.0)
MCV: 87.4 fL (ref 78.0–100.0)
MONO ABS: 1 10*3/uL (ref 0.1–1.0)
MONOS PCT: 10 %
NEUTROS ABS: 4.8 10*3/uL (ref 1.7–7.7)
Neutrophils Relative %: 53 %
Platelets: 410 10*3/uL — ABNORMAL HIGH (ref 150–400)
RBC: 5.07 MIL/uL (ref 3.87–5.11)
RDW: 15 % (ref 11.5–15.5)
WBC: 9.2 10*3/uL (ref 4.0–10.5)

## 2016-05-09 LAB — LIPASE, BLOOD: Lipase: 22 U/L (ref 11–51)

## 2016-05-09 LAB — TROPONIN I

## 2016-05-09 MED ORDER — SUCRALFATE 1 G PO TABS: ORAL_TABLET | ORAL | 5 refills | Status: DC

## 2016-05-09 MED ORDER — PANTOPRAZOLE SODIUM 40 MG PO TBEC: 40.0000 mg | DELAYED_RELEASE_TABLET | Freq: Two times a day (BID) | ORAL | 1 refills | Status: DC

## 2016-05-09 NOTE — Progress Notes (Signed)
   Subjective:    Patient ID: Jacqueline Morgan, female    DOB: 1940-11-29, 76 y.o.   MRN: [id number]  Abdominal Pain  This is a new problem. The current episode started in the past 7 days. The problem occurs intermittently. The problem has been unchanged. The pain is located in the epigastric region. The pain is moderate. The quality of the pain is sharp. The abdominal pain does not radiate. Associated symptoms include nausea. Nothing aggravates the pain. The pain is relieved by nothing. Treatments tried: Protonix, carafate. The treatment provided no relief.   Patient has no other concerns at this time.  Patient relates epigastric pain or past several days does not wake her up at night. There is some nausea with it. Some burning some sharp stabbing pains. Denies chest pressure tightness. Relates it has tried anti-reflux medicine and protonic's as well as a couple days worth Carafate without much help. Denies any rectal bleeding.   Review of Systems  Gastrointestinal: Positive for abdominal pain and nausea.   See above please no fever chills or cough    Objective:   Physical Exam  Lungs clear heart irregular rate controlled abdomen soft with mild epigastric tenderness chest wall slight tenderness at the xiphoid patient does not appear to be in distress      Assessment & Plan:  Epigastric pain-EKG looks reassuring Will do troponin to make sure this is not cardiac I believe this is most likely gastritis Continue Protonix twice daily Add Carafate with all meals and at bedtime May need referral to gastroenterology for EGD patient is on Coumadin but not taking any type of anti-inflammatories denies any rectal bleeding warning signs were discussed

## 2016-05-10 ENCOUNTER — Other Ambulatory Visit: Payer: Self-pay

## 2016-05-10 ENCOUNTER — Encounter: Payer: Self-pay | Admitting: Family Medicine

## 2016-05-10 DIAGNOSIS — R1013 Epigastric pain: Secondary | ICD-10-CM

## 2016-05-12 ENCOUNTER — Encounter: Payer: Self-pay | Admitting: Family Medicine

## 2016-05-30 ENCOUNTER — Ambulatory Visit (INDEPENDENT_AMBULATORY_CARE_PROVIDER_SITE_OTHER): Payer: Medicare Other | Admitting: Internal Medicine

## 2016-05-30 ENCOUNTER — Encounter (INDEPENDENT_AMBULATORY_CARE_PROVIDER_SITE_OTHER): Payer: Self-pay | Admitting: *Deleted

## 2016-05-30 ENCOUNTER — Encounter (INDEPENDENT_AMBULATORY_CARE_PROVIDER_SITE_OTHER): Payer: Self-pay | Admitting: Internal Medicine

## 2016-05-30 VITALS — BP 110/76 | HR 60 | Temp 97.6°F | Ht 61.0 in | Wt 163.5 lb

## 2016-05-30 DIAGNOSIS — R1013 Epigastric pain: Secondary | ICD-10-CM

## 2016-05-30 NOTE — Progress Notes (Signed)
Subjective:    Patient ID: Jacqueline Morgan, female    DOB: 28-Jun-1940, 76 y.o.   MRN: [id number]  HPI Referred by Dr Sallee Lange for epigastric pain with nausea. She has tried Protonix and Carafate which helps.  She says when her pain starts, it is in the center of her chest and then has pain in her upper abdomen. She says when she has a flare, it takes 3-4 days to get over (for the pain to resolve).  She says when the pain occurs she has a hard time taking a deep breath.  There is not pain today. The pain occurs sometimes when she hasn't eaten enough during the day.  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  The pain usually occurs maybe once a month or every other month. Symptoms off an on for years.  The Protonix helps with her acid reflux.  Appetite is good. No weight loss. Right now she feels 100% better.  Usually has a BM every 2-3 days. No melena or BRRB.  She says she underwent a EGD years ago by Dr. Gala Romney years ago.     09/01/2015 US abdomen  Intermittent epigastric pian for the past month associated with nausea. Hx of reflux and high cholesterol.   IMPRESSION: 1. No gallstones or evidence of other acute gallbladder pathology. If gallbladder dysfunction is suspected clinically, a nuclear medicine hepatobiliary scan may be useful. 2. Mildly increased hepatic echotexture consistent with fatty infiltration. 3. Nonobstructing midpole stone or parenchymal calcification in the right kidney. 4. Limited visualization of the inferior vena cava and pancreas. Top-normal proximal abdominal aortic diameter 2.8 cm.    04/01/2015 Colonoscopy with snare polypectomy: (Heme positive stool)  Impression:   Examination performed to cecum.  Thirteen small polyps were cold snared and submitted together. Locations as above.  Two polyps that were hot snared were submitted together. Single clip applied to   polypectomy site at ascending colon.  Multiple sigmoid  colon diverticula.  Small external hemorrhoids.  Patient had multiple small polyps removed. 2 polyps are sessile serrated polyps and the rest tubular adenomas. Results reviewed with patient. Next colonoscopy in 3 years.   Review of Systems Past Medical History:  Diagnosis Date  . Anxiety   . Depression   . Fibromyalgia   . GERD (gastroesophageal reflux disease)   . Hyperlipidemia   . Hypertension   . Palpitations   . 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 COLONOSCOPY N/A 04/01/2015   Procedure: COLONOSCOPY;  Surgeon: Rogene Houston, MD;  [facility];  Service: Endoscopy;  Laterality: N/A;  1:45  . EMBOLECTOMY Left 12/19/2012   Procedure: EMBOLECTOMY OF LEFT BRACHIAL ARTERY;  Surgeon: Conrad [place]ville, MD;  Location: [place];  Service: Vascular;  Laterality: Left;  . EYE SURGERY      Allergies  Allergen Reactions  . Codeine     REACTION: Adverse gastrointestinal effects  . Statins     Felt bad    Current Outpatient Prescriptions on File Prior to Visit  Medication Sig Dispense Refill  . acetaminophen (TYLENOL) 500 MG tablet Take 1,000 mg by mouth once as needed. For headache/pain    . calcium carbonate (OS-CAL) 600 MG TABS Take 600 mg by mouth daily.      [name] latanoprost (XALATAN) 0.005 % ophthalmic solution     . LORazepam (ATIVAN) 1 MG tablet TAKE 1 TABLET AT NOON AND ONE TABLET  AT BEDTIME 75 tablet 3  . metoprolol succinate (TOPROL-XL) 25 MG 24 hr tablet Take 1 tablet (25 mg total) by mouth daily. Take with or immediately following a meal. 90 tablet 2  . ondansetron (ZOFRAN-ODT) 8 MG disintegrating tablet Take 1 tablet (8 mg total) by mouth every 8 (eight) hours as needed for nausea or vomiting. 30 tablet 0  . pantoprazole (PROTONIX) 40 MG tablet Take 1 tablet (40 mg total) by mouth 2 (two) times daily. 180 tablet 1  . PARoxetine (PAXIL) 20 MG tablet Take 1 tablet in the morning, and 2 tablets in the evening by mouth.  270 tablet 2  . sucralfate (CARAFATE) 1 g tablet TAKE 1 TABLET BY MOUTH BEFORE MEALS AND AT BEDTIME. (Patient taking differently: As needed) 120 tablet 5  . warfarin (COUMADIN) 5 MG tablet TAKE 1/2 TABLET BY MOUTH DAILY, EXCEPT TAKE 1 TABLET BY MOUTH DAILY ON MONDAYS AND THURSDAYS. 19 tablet 4  . mirtazapine (REMERON) 15 MG tablet Take 0.5 tablets (7.5 mg total) by mouth at bedtime. (Patient not taking: Reported on 05/30/2016) 15 tablet 0   No current facility-administered medications on file prior to visit.        Objective:   Physical Exam Blood pressure 110/76, pulse 60, temperature 97.6 F (36.4 C), height 5\' 1"  (1.549 m), weight 163 lb 8 oz (74.2 kg). Alert and oriented. Skin warm and dry. Oral mucosa is moist.   . Sclera anicteric, conjunctivae is pink. Thyroid not enlarged. No cervical lymphadenopathy. Lungs clear. Heart regular rate and rhythm.  Abdomen is soft. Bowel sounds are positive. No hepatomegaly. No abdominal masses felt. No tenderness.  No edema to lower extremities.        Assessment & Plan:  Epigastric pain. Better since starting the Protonix.  HIDA scan.  Further recommendations to follow.

## 2016-05-30 NOTE — Patient Instructions (Signed)
US abdomen 

## 2016-05-31 ENCOUNTER — Encounter (HOSPITAL_COMMUNITY): Payer: Self-pay

## 2016-05-31 ENCOUNTER — Other Ambulatory Visit: Payer: Self-pay | Admitting: Family Medicine

## 2016-05-31 ENCOUNTER — Encounter (HOSPITAL_COMMUNITY)
Admission: RE | Admit: 2016-05-31 | Discharge: 2016-05-31 | Disposition: A | Discharge status: Home / Self Care | Payer: Medicare Other | Source: Ambulatory Visit | Attending: Internal Medicine | Admitting: Internal Medicine

## 2016-05-31 DIAGNOSIS — R1013 Epigastric pain: Secondary | ICD-10-CM | POA: Insufficient documentation

## 2016-05-31 DIAGNOSIS — R109 Unspecified abdominal pain: Secondary | ICD-10-CM | POA: Diagnosis not present

## 2016-05-31 MED ORDER — TECHNETIUM TC 99M MEBROFENIN IV KIT
5.0000 | PACK | Freq: Once | INTRAVENOUS | Status: AC | PRN
  Administered 2016-05-31: 5.3 via INTRAVENOUS

## 2016-05-31 NOTE — Telephone Encounter (Signed)
Patient last seen on 05/09/16 for epigastric pain

## 2016-05-31 NOTE — Telephone Encounter (Signed)
May have this +5 refills 

## 2016-06-06 ENCOUNTER — Ambulatory Visit (INDEPENDENT_AMBULATORY_CARE_PROVIDER_SITE_OTHER): Payer: Medicare Other | Admitting: *Deleted

## 2016-06-06 DIAGNOSIS — Z7901 Long term (current) use of anticoagulants: Secondary | ICD-10-CM | POA: Diagnosis not present

## 2016-06-06 DIAGNOSIS — Z5181 Encounter for therapeutic drug level monitoring: Secondary | ICD-10-CM | POA: Diagnosis not present

## 2016-06-06 DIAGNOSIS — I82622 Acute embolism and thrombosis of deep veins of left upper extremity: Secondary | ICD-10-CM

## 2016-06-06 LAB — POCT INR: INR: 3.2

## 2016-06-07 ENCOUNTER — Encounter (INDEPENDENT_AMBULATORY_CARE_PROVIDER_SITE_OTHER): Payer: Self-pay | Admitting: Internal Medicine

## 2016-06-07 NOTE — Progress Notes (Signed)
Patient was given an appointment for 09/07/16 at 11:30am with Deberah Castle, NP.  A letter was mailed to the patient.

## 2016-06-21 DIAGNOSIS — H40013 Open angle with borderline findings, low risk, bilateral: Secondary | ICD-10-CM | POA: Diagnosis not present

## 2016-06-21 DIAGNOSIS — H3401 Transient retinal artery occlusion, right eye: Secondary | ICD-10-CM | POA: Diagnosis not present

## 2016-06-21 DIAGNOSIS — Z961 Presence of intraocular lens: Secondary | ICD-10-CM | POA: Diagnosis not present

## 2016-06-22 ENCOUNTER — Other Ambulatory Visit: Payer: Self-pay | Admitting: Family Medicine

## 2016-07-04 ENCOUNTER — Ambulatory Visit (INDEPENDENT_AMBULATORY_CARE_PROVIDER_SITE_OTHER): Payer: Medicare Other | Admitting: *Deleted

## 2016-07-04 DIAGNOSIS — Z7901 Long term (current) use of anticoagulants: Secondary | ICD-10-CM

## 2016-07-04 DIAGNOSIS — Z5181 Encounter for therapeutic drug level monitoring: Secondary | ICD-10-CM | POA: Diagnosis not present

## 2016-07-04 DIAGNOSIS — I82622 Acute embolism and thrombosis of deep veins of left upper extremity: Secondary | ICD-10-CM | POA: Diagnosis not present

## 2016-07-04 LAB — POCT INR: INR: 2.4

## 2016-08-01 ENCOUNTER — Other Ambulatory Visit: Payer: Self-pay | Admitting: Family Medicine

## 2016-08-01 ENCOUNTER — Ambulatory Visit (INDEPENDENT_AMBULATORY_CARE_PROVIDER_SITE_OTHER): Payer: Medicare Other | Admitting: *Deleted

## 2016-08-01 DIAGNOSIS — I82622 Acute embolism and thrombosis of deep veins of left upper extremity: Secondary | ICD-10-CM | POA: Diagnosis not present

## 2016-08-01 DIAGNOSIS — Z5181 Encounter for therapeutic drug level monitoring: Secondary | ICD-10-CM | POA: Diagnosis not present

## 2016-08-01 DIAGNOSIS — Z7901 Long term (current) use of anticoagulants: Secondary | ICD-10-CM

## 2016-08-01 LAB — POCT INR: INR: 2

## 2016-08-01 NOTE — Telephone Encounter (Signed)
May have each of these +3 refills

## 2016-08-03 DIAGNOSIS — H40013 Open angle with borderline findings, low risk, bilateral: Secondary | ICD-10-CM | POA: Diagnosis not present

## 2016-08-30 ENCOUNTER — Encounter: Payer: Self-pay | Admitting: Family Medicine

## 2016-08-30 DIAGNOSIS — Z1231 Encounter for screening mammogram for malignant neoplasm of breast: Secondary | ICD-10-CM | POA: Diagnosis not present

## 2016-08-31 ENCOUNTER — Ambulatory Visit (INDEPENDENT_AMBULATORY_CARE_PROVIDER_SITE_OTHER): Payer: Medicare Other | Admitting: *Deleted

## 2016-08-31 DIAGNOSIS — Z5181 Encounter for therapeutic drug level monitoring: Secondary | ICD-10-CM

## 2016-08-31 DIAGNOSIS — I82622 Acute embolism and thrombosis of deep veins of left upper extremity: Secondary | ICD-10-CM

## 2016-08-31 DIAGNOSIS — Z7901 Long term (current) use of anticoagulants: Secondary | ICD-10-CM

## 2016-08-31 LAB — POCT INR: INR: 1.8

## 2016-09-01 ENCOUNTER — Telehealth: Payer: Self-pay | Admitting: Family Medicine

## 2016-09-01 NOTE — Telephone Encounter (Signed)
Review screening mammogram results in results folder from [facility]. °

## 2016-09-02 ENCOUNTER — Other Ambulatory Visit: Payer: Self-pay | Admitting: Cardiology

## 2016-09-02 NOTE — Telephone Encounter (Signed)
Normal mammogram

## 2016-09-07 ENCOUNTER — Ambulatory Visit (INDEPENDENT_AMBULATORY_CARE_PROVIDER_SITE_OTHER): Payer: Medicare Other | Admitting: Internal Medicine

## 2016-09-14 ENCOUNTER — Ambulatory Visit: Payer: Medicare Other | Admitting: Nurse Practitioner

## 2016-09-14 DIAGNOSIS — H40013 Open angle with borderline findings, low risk, bilateral: Secondary | ICD-10-CM | POA: Diagnosis not present

## 2016-09-21 ENCOUNTER — Ambulatory Visit (INDEPENDENT_AMBULATORY_CARE_PROVIDER_SITE_OTHER): Payer: Medicare Other | Admitting: *Deleted

## 2016-09-21 DIAGNOSIS — Z7901 Long term (current) use of anticoagulants: Secondary | ICD-10-CM

## 2016-09-21 DIAGNOSIS — I82622 Acute embolism and thrombosis of deep veins of left upper extremity: Secondary | ICD-10-CM | POA: Diagnosis not present

## 2016-09-21 DIAGNOSIS — Z5181 Encounter for therapeutic drug level monitoring: Secondary | ICD-10-CM | POA: Diagnosis not present

## 2016-09-21 LAB — POCT INR: INR: 2.7

## 2016-10-19 ENCOUNTER — Ambulatory Visit (INDEPENDENT_AMBULATORY_CARE_PROVIDER_SITE_OTHER): Payer: Medicare Other | Admitting: *Deleted

## 2016-10-19 DIAGNOSIS — Z7901 Long term (current) use of anticoagulants: Secondary | ICD-10-CM | POA: Diagnosis not present

## 2016-10-19 DIAGNOSIS — Z5181 Encounter for therapeutic drug level monitoring: Secondary | ICD-10-CM

## 2016-10-19 DIAGNOSIS — I82622 Acute embolism and thrombosis of deep veins of left upper extremity: Secondary | ICD-10-CM | POA: Diagnosis not present

## 2016-10-19 LAB — POCT INR: INR: 2.6

## 2016-10-28 ENCOUNTER — Telehealth: Payer: Self-pay | Admitting: *Deleted

## 2016-10-28 ENCOUNTER — Encounter: Payer: Self-pay | Admitting: Cardiology

## 2016-10-28 NOTE — Telephone Encounter (Signed)
Returned call to pt - she states she is having teeth pulled on 11/14/16. She states she thinks she is just having 1 tooth pulled. Advised her to continue warfarin for single dental extraction and to call clinic if she believes she is having 3+ extractions at the same time because we would need to interrupt anticoagulation at that time.

## 2016-10-28 NOTE — Telephone Encounter (Signed)
Patient has questions regarding needing to stop warfarin for dental procedure / tg

## 2016-11-04 ENCOUNTER — Encounter: Payer: Self-pay | Admitting: Cardiology

## 2016-11-08 ENCOUNTER — Telehealth: Payer: Self-pay | Admitting: Pharmacist

## 2016-11-08 NOTE — Telephone Encounter (Signed)
Called Dr Raynelle Dick office in response to pt email from 11/04/16 - and receptionist stated that pt just has a consultation appt on 8/13, there is no surgery scheduled yet. Provided them with our fax and phone number so they can let us know once pt is scheduled for procedure so that she can be cleared from an anticoagulation standpoint.

## 2016-11-16 ENCOUNTER — Ambulatory Visit (INDEPENDENT_AMBULATORY_CARE_PROVIDER_SITE_OTHER): Payer: Medicare Other | Admitting: *Deleted

## 2016-11-16 DIAGNOSIS — Z7901 Long term (current) use of anticoagulants: Secondary | ICD-10-CM

## 2016-11-16 DIAGNOSIS — I82622 Acute embolism and thrombosis of deep veins of left upper extremity: Secondary | ICD-10-CM | POA: Diagnosis not present

## 2016-11-16 DIAGNOSIS — Z5181 Encounter for therapeutic drug level monitoring: Secondary | ICD-10-CM

## 2016-11-16 LAB — POCT INR: INR: 2.4

## 2016-11-17 ENCOUNTER — Other Ambulatory Visit: Payer: Self-pay | Admitting: Family Medicine

## 2016-11-18 NOTE — Telephone Encounter (Signed)
May have this +1 refill needs office visit this fall 

## 2016-11-30 ENCOUNTER — Other Ambulatory Visit: Payer: Self-pay | Admitting: Family Medicine

## 2016-12-28 ENCOUNTER — Ambulatory Visit (INDEPENDENT_AMBULATORY_CARE_PROVIDER_SITE_OTHER): Payer: Medicare Other | Admitting: *Deleted

## 2016-12-28 DIAGNOSIS — I82622 Acute embolism and thrombosis of deep veins of left upper extremity: Secondary | ICD-10-CM

## 2016-12-28 DIAGNOSIS — Z7901 Long term (current) use of anticoagulants: Secondary | ICD-10-CM

## 2016-12-28 DIAGNOSIS — Z5181 Encounter for therapeutic drug level monitoring: Secondary | ICD-10-CM

## 2016-12-28 LAB — POCT INR: INR: 4.8

## 2017-01-11 ENCOUNTER — Ambulatory Visit (INDEPENDENT_AMBULATORY_CARE_PROVIDER_SITE_OTHER): Payer: Medicare Other | Admitting: *Deleted

## 2017-01-11 DIAGNOSIS — I82621 Acute embolism and thrombosis of deep veins of right upper extremity: Secondary | ICD-10-CM | POA: Diagnosis not present

## 2017-01-11 DIAGNOSIS — Z5181 Encounter for therapeutic drug level monitoring: Secondary | ICD-10-CM

## 2017-01-11 LAB — POCT INR: INR: 2.7

## 2017-01-18 ENCOUNTER — Other Ambulatory Visit: Payer: Self-pay | Admitting: Family Medicine

## 2017-01-18 NOTE — Telephone Encounter (Signed)
This with one refill needs follow-up visit by February

## 2017-01-18 NOTE — Telephone Encounter (Signed)
Last seen 05/09/16 for epigastric pain

## 2017-01-19 ENCOUNTER — Other Ambulatory Visit: Payer: Self-pay | Admitting: Family Medicine

## 2017-01-24 ENCOUNTER — Other Ambulatory Visit: Payer: Self-pay | Admitting: Family Medicine

## 2017-01-24 NOTE — Telephone Encounter (Signed)
Last seen 05/09/16 for epigastric pain

## 2017-01-24 NOTE — Telephone Encounter (Signed)
May have this with 2 additional refills needs follow-up office visit

## 2017-01-27 ENCOUNTER — Encounter: Payer: Self-pay | Admitting: Family Medicine

## 2017-01-27 ENCOUNTER — Ambulatory Visit (INDEPENDENT_AMBULATORY_CARE_PROVIDER_SITE_OTHER): Payer: Medicare Other | Admitting: Family Medicine

## 2017-01-27 DIAGNOSIS — F411 Generalized anxiety disorder: Secondary | ICD-10-CM | POA: Diagnosis not present

## 2017-01-27 MED ORDER — LORAZEPAM 1 MG PO TABS: ORAL_TABLET | ORAL | 5 refills | Status: DC

## 2017-01-27 MED ORDER — MIRTAZAPINE 15 MG PO TABS: 7.5000 mg | ORAL_TABLET | Freq: Every day | ORAL | 5 refills | Status: DC

## 2017-01-27 NOTE — Patient Instructions (Signed)
Increase the Lorazepam  Use 1 in mid morning One-half late afternoon Then 1 each evening   Continue Paxil 3 daily  Recheck here in 3 months

## 2017-01-27 NOTE — Progress Notes (Signed)
   Subjective:    Patient ID: Jacqueline Morgan, female    DOB: 23-Oct-1940, 76 y.o.   MRN: [id number]  Arm Pain   Incident onset: off and on for awhile. Pain location: right arm.  right arm tingling. Sometimes not able to lift arm.  Patient states she had blood clot years ago she is worried about getting another blood clot she is on blood thinners she gets her INR checked regularly she denies any major issues or problems Declines flu vaccine.  PMH benign has severe issues with anxiety panic attacks and depression Patient has a lot of worries finds herself stressing over a lot of things but does not want any type of counseling  Review of Systems Please see above denies chest tightness pressure pain shortness breath nausea vomiting diarrhea    Objective:   Physical Exam  Lungs clear heart regular pulse and both arms normal strength normal both arms no swelling redness bruising or masses felt in either arm Patient very anxious trembles nervous in her voice tearful at times     Assessment & Plan:  Right arm pain and discomfort-I find no evidence of blood clot going on her symptomatology comes and goes which is not consistent with a blood clot there is no swelling or redness pulses are good capillary refill good  Severe generalized anxiety disorder along with panic attacks and depression is on the maximal dose of Paxil we will increase her lorazepam up to 1 tablet midmorning half tablet mid afternoon and 1 tablet in the evening patient denies it causing drowsiness denies abusing it  Patient does not want to do any counseling she is not suicidal or homicidal She does agree to follow-up in 3 months time

## 2017-02-03 ENCOUNTER — Other Ambulatory Visit: Payer: Self-pay | Admitting: Cardiovascular Disease

## 2017-02-10 ENCOUNTER — Ambulatory Visit (INDEPENDENT_AMBULATORY_CARE_PROVIDER_SITE_OTHER): Payer: Medicare Other | Admitting: *Deleted

## 2017-02-10 DIAGNOSIS — I82621 Acute embolism and thrombosis of deep veins of right upper extremity: Secondary | ICD-10-CM | POA: Diagnosis not present

## 2017-02-10 DIAGNOSIS — Z5181 Encounter for therapeutic drug level monitoring: Secondary | ICD-10-CM

## 2017-02-10 LAB — POCT INR: INR: 2.7

## 2017-02-10 NOTE — Progress Notes (Signed)
Continue taking 1/2 tablet daily except 1 tablet on Sundays and Wednesdays Recheck in 4 weeks.

## 2017-03-02 ENCOUNTER — Other Ambulatory Visit: Payer: Self-pay

## 2017-03-02 ENCOUNTER — Emergency Department (HOSPITAL_COMMUNITY)
Admission: EM | Admit: 2017-03-02 | Discharge: 2017-03-02 | Disposition: A | Discharge status: Home / Self Care | Payer: Medicare Other | Attending: Emergency Medicine | Admitting: Emergency Medicine

## 2017-03-02 ENCOUNTER — Emergency Department (HOSPITAL_COMMUNITY): Payer: Medicare Other

## 2017-03-02 ENCOUNTER — Encounter (HOSPITAL_COMMUNITY): Payer: Self-pay | Admitting: Emergency Medicine

## 2017-03-02 DIAGNOSIS — I10 Essential (primary) hypertension: Secondary | ICD-10-CM | POA: Diagnosis not present

## 2017-03-02 DIAGNOSIS — Z79899 Other long term (current) drug therapy: Secondary | ICD-10-CM | POA: Diagnosis not present

## 2017-03-02 DIAGNOSIS — R55 Syncope and collapse: Secondary | ICD-10-CM | POA: Diagnosis not present

## 2017-03-02 DIAGNOSIS — Z885 Allergy status to narcotic agent status: Secondary | ICD-10-CM | POA: Insufficient documentation

## 2017-03-02 DIAGNOSIS — R569 Unspecified convulsions: Secondary | ICD-10-CM | POA: Insufficient documentation

## 2017-03-02 DIAGNOSIS — R251 Tremor, unspecified: Secondary | ICD-10-CM | POA: Diagnosis not present

## 2017-03-02 DIAGNOSIS — Z7901 Long term (current) use of anticoagulants: Secondary | ICD-10-CM | POA: Diagnosis not present

## 2017-03-02 DIAGNOSIS — R4182 Altered mental status, unspecified: Secondary | ICD-10-CM | POA: Diagnosis not present

## 2017-03-02 DIAGNOSIS — R110 Nausea: Secondary | ICD-10-CM | POA: Diagnosis not present

## 2017-03-02 DIAGNOSIS — Z8679 Personal history of other diseases of the circulatory system: Secondary | ICD-10-CM | POA: Insufficient documentation

## 2017-03-02 LAB — COMPREHENSIVE METABOLIC PANEL
ALK PHOS: 105 U/L (ref 38–126)
ALT: 20 U/L (ref 14–54)
AST: 35 U/L (ref 15–41)
Albumin: 3.4 g/dL — ABNORMAL LOW (ref 3.5–5.0)
Anion gap: 10 (ref 5–15)
BUN: 9 mg/dL (ref 6–20)
CALCIUM: 8.6 mg/dL — AB (ref 8.9–10.3)
CHLORIDE: 103 mmol/L (ref 101–111)
CO2: 23 mmol/L (ref 22–32)
CREATININE: 0.86 mg/dL (ref 0.44–1.00)
Glucose, Bld: 116 mg/dL — ABNORMAL HIGH (ref 65–99)
Potassium: 3.8 mmol/L (ref 3.5–5.1)
SODIUM: 136 mmol/L (ref 135–145)
Total Bilirubin: 0.6 mg/dL (ref 0.3–1.2)
Total Protein: 7.5 g/dL (ref 6.5–8.1)

## 2017-03-02 LAB — CBC WITH DIFFERENTIAL/PLATELET
BASOS ABS: 0 10*3/uL (ref 0.0–0.1)
Basophils Relative: 0 %
EOS ABS: 0.1 10*3/uL (ref 0.0–0.7)
EOS PCT: 1 %
HCT: 43.1 % (ref 36.0–46.0)
Hemoglobin: 14.3 g/dL (ref 12.0–15.0)
LYMPHS ABS: 1.7 10*3/uL (ref 0.7–4.0)
LYMPHS PCT: 14 %
MCH: 29.4 pg (ref 26.0–34.0)
MCHC: 33.2 g/dL (ref 30.0–36.0)
MCV: 88.5 fL (ref 78.0–100.0)
MONO ABS: 0.8 10*3/uL (ref 0.1–1.0)
Monocytes Relative: 6 %
Neutro Abs: 9.8 10*3/uL — ABNORMAL HIGH (ref 1.7–7.7)
Neutrophils Relative %: 79 %
PLATELETS: 288 10*3/uL (ref 150–400)
RBC: 4.87 MIL/uL (ref 3.87–5.11)
RDW: 15.4 % (ref 11.5–15.5)
WBC: 12.4 10*3/uL — ABNORMAL HIGH (ref 4.0–10.5)

## 2017-03-02 LAB — CBG MONITORING, ED: GLUCOSE-CAPILLARY: 115 mg/dL — AB (ref 65–99)

## 2017-03-02 LAB — TROPONIN I

## 2017-03-02 MED ORDER — FENTANYL CITRATE (PF) 100 MCG/2ML IJ SOLN
12.5000 ug | Freq: Once | INTRAMUSCULAR | Status: AC
  Administered 2017-03-02: 12.5 ug via INTRAVENOUS
  Filled 2017-03-02: qty 2

## 2017-03-02 MED ORDER — SODIUM CHLORIDE 0.9 % IV SOLN
INTRAVENOUS | Status: DC
  Administered 2017-03-02: 13:00:00 via INTRAVENOUS

## 2017-03-02 NOTE — ED Triage Notes (Signed)
Pt in from via [facility] with new onset seizures. Per EMS, pt was having all upper teeth pulled this morning. Was given 7mg  Versed and 0.5mg  Fentanyl for procedure at 0830. Around 1110, pt was riding home in car and daughter witnessed seizure-like activity x 5-10 sec. Post-ictal on scene with incontinence. Arrives to ED a&o, sats 93% RA

## 2017-03-02 NOTE — Discharge Instructions (Signed)
As discussed, your evaluation today has been largely reassuring.  But, it is important that you monitor your condition carefully, and do not hesitate to return to the ED if you develop new, or concerning changes in your condition. ? ?Otherwise, please follow-up with your physician for appropriate ongoing care. ? ?

## 2017-03-02 NOTE — ED Provider Notes (Signed)
[place] EMERGENCY DEPARTMENT Provider Note   CSN: [id number] Arrival date & time: 03/02/17  1220     History   Chief Complaint Chief Complaint  Patient presents with  . Seizures    HPI Jacqueline Morgan is a 76 y.o. female.  HPI Patient presents after an episode of syncope versus seizure. Patient has no history of seizure, notes that she has had syncope after episodes of nausea. Patient acknowledges multiple other medical problems, but states that she is doing generally well prior to a planned dental procedure today. After the procedure, patient notes that she was particularly nauseous. While in a car with a family member she was witnessed to have a period of change in consciousness, lasting 5/10 seconds, with associated shaking. It is unclear if there was a prolonged return to normal interactivity, but the patient currently is awake and alert, stating that she feels fine aside from nausea, and soreness throughout her maxilla. No recent medication changes, diet changes, activity changes. EMS provided 7 mg of Versed en route. Past Medical History:  Diagnosis Date  . Anxiety   . Depression   . Fibromyalgia   . GERD (gastroesophageal reflux disease)   . Hyperlipidemia   . Hypertension   . Palpitations   . Tremor, essential 11/30/2015    Patient Active Problem List   Diagnosis Date Noted  . GAD (generalized anxiety disorder) 01/27/2017  . Subjective visual disturbance 11/30/2015  . Tremor, essential 11/30/2015  . GERD (gastroesophageal reflux disease) 06/24/2014  . Multiple thyroid nodules 06/24/2014  . Encounter for therapeutic drug monitoring 05/06/2013  . Brachial artery embolus ([place]) 03/08/2013  . PAD (peripheral artery disease) ([place]) 12/31/2012  . DVT of upper extremity (deep vein thrombosis) ([place]) 12/24/2012  . Long term (current) use of anticoagulants 12/24/2012  . GASTROESOPHAGEAL REFLUX DISEASE 05/19/2009  . FIBROMYALGIA 05/19/2009  .  SYNCOPE 05/19/2009  . Hyperglycemia 05/19/2009  . Hyperlipidemia 05/07/2009  . Panic attacks 05/07/2009  . DEPRESSION 05/07/2009  . Essential hypertension 05/07/2009    Past Surgical History:  Procedure Laterality Date  . COLONOSCOPY N/A 04/01/2015   Procedure: COLONOSCOPY;  Surgeon: Rogene Houston, MD;  [facility];  Service: Endoscopy;  Laterality: N/A;  1:45  . EMBOLECTOMY Left 12/19/2012   Procedure: EMBOLECTOMY OF LEFT BRACHIAL ARTERY;  Surgeon: Conrad Kangley, MD;  Location: [place];  Service: Vascular;  Laterality: Left;  . EYE SURGERY      OB History    No data available       Home Medications    Prior to Admission medications   Medication Sig Start Date End Date Taking? Authorizing Provider  acetaminophen (TYLENOL) 500 MG tablet Take 1,000 mg by mouth once as needed. For headache/pain    [provider]  calcium carbonate (OS-CAL) 600 MG TABS Take 600 mg by mouth daily.      [provider]  latanoprost (XALATAN) 0.005 % ophthalmic solution  05/16/13   [provider]  LORazepam (ATIVAN) 1 MG tablet 1 late morning,1/2 late afternoon then 1 q evening 01/27/17   Luking, Elayne Snare, MD  metoprolol succinate (TOPROL-XL) 25 MG 24 hr tablet TAKE 1 TABLET BY MOUTH  DAILY WITH OR IMMEDIATLEY  FOLLOWING A MEAL 01/20/17   Luking, Scott A, MD  mirtazapine (REMERON) 15 MG tablet Take 0.5 tablets (7.5 mg total) by mouth at bedtime. 01/27/17   Kathyrn Drown, MD  ondansetron (ZOFRAN-ODT) 8 MG disintegrating tablet TAKE 1 TABLET BY MOUTH  EVERY 8 HOURS AS NEEDED FOR NAUSEA OR VOMITING. 11/18/16   Kathyrn Drown, MD  pantoprazole (PROTONIX) 40 MG tablet TAKE 1 TABLET BY MOUTH TWO  TIMES DAILY 01/20/17   Kathyrn Drown, MD  PARoxetine (PAXIL) 20 MG tablet TAKE 1 TABLET IN THE  MORNING AND 2 TABLETS IN  THE EVENING 01/18/17   Luking, Scott A, MD  sucralfate (CARAFATE) 1 g tablet TAKE 1 TABLET BY MOUTH BEFORE MEALS AND AT BEDTIME. Patient taking differently:  As needed 05/09/16   Kathyrn Drown, MD  warfarin (COUMADIN) 5 MG tablet TAKE 1/2 TABLET BY MOUTH DAILY, EXCEPT TAKE 1 TABLET BY MOUTH DAILY ON MONDAYS AND THURSDAYS. 02/03/17   Arnoldo Lenis, MD    Family History Family History  Problem Relation Age of Onset  . Cancer Mother        ovarian, colon  . Heart disease Mother   . Heart attack Father   . Glaucoma Father   . COPD Sister   . Benign prostatic hyperplasia Brother     Social History Social History   Tobacco Use  . Smoking status: Never Smoker  . Smokeless tobacco: Never Used  Substance Use Topics  . Alcohol use: No  . Drug use: No     Allergies   Codeine and Statins   Review of Systems Review of Systems  Constitutional:       Per HPI, otherwise negative  HENT:       Per HPI, otherwise negative  Respiratory:       Per HPI, otherwise negative  Cardiovascular:       Per HPI, otherwise negative  Gastrointestinal: Positive for nausea. Negative for vomiting.  Endocrine:       Negative aside from HPI  Genitourinary:       Neg aside from HPI   Musculoskeletal:       Per HPI, otherwise negative  Skin: Negative.   Neurological: Positive for seizures (no Hx of this) and syncope. Negative for weakness.     Physical Exam Updated Vital Signs BP (!) 154/90 (BP Location: Right Arm)   Pulse 81   Temp (!) 96.8 F (36 C) (Axillary)   Resp 14   Ht 5\' 4"  (1.626 m)   Wt 73.5 kg (162 lb)   SpO2 96%   BMI 27.81 kg/m   Physical Exam  Constitutional: She is oriented to person, place, and time. She appears well-developed and well-nourished. No distress.  HENT:  Head: Normocephalic and atraumatic.  Mouth/Throat:    Eyes: Conjunctivae and EOM are normal.  Cardiovascular: Normal rate and regular rhythm.  Pulmonary/Chest: Effort normal and breath sounds normal. No stridor. No respiratory distress.  Abdominal: She exhibits no distension.  Musculoskeletal: She exhibits no edema.  Neurological: She is alert and  oriented to person, place, and time. She displays no atrophy and no tremor. No cranial nerve deficit. She exhibits normal muscle tone. She displays no seizure activity. Coordination normal.  Skin: Skin is warm and dry.  Psychiatric: She has a normal mood and affect.  Nursing note and vitals reviewed.    ED Treatments / Results  Labs (all labs ordered are listed, but only abnormal results are displayed) Labs Reviewed  COMPREHENSIVE METABOLIC PANEL - Abnormal; Notable for the following components:      Result Value   Glucose, Bld 116 (*)    Calcium 8.6 (*)    Albumin 3.4 (*)    All other components within normal limits  CBC WITH  DIFFERENTIAL/PLATELET - Abnormal; Notable for the following components:   WBC 12.4 (*)    Neutro Abs 9.8 (*)    All other components within normal limits  CBG MONITORING, ED - Abnormal; Notable for the following components:   Glucose-Capillary 115 (*)    All other components within normal limits  TROPONIN I    EKG  EKG Interpretation  Date/Time:  Thursday March 02 2017 12:43:41 EST Ventricular Rate:  82 PR Interval:    QRS Duration: 99 QT Interval:  407 QTC Calculation: 476 R Axis:   -38 Text Interpretation:  Sinus rhythm Left axis deviation T wave abnormality Abnormal ekg Confirmed by Carmin Muskrat [phone]) on 03/02/2017 1:00:17 PM       Procedures Procedures (including critical care time)  Medications Ordered in ED Medications  0.9 %  sodium chloride infusion ( Intravenous New Bag/Given 03/02/17 1258)  fentaNYL (SUBLIMAZE) injection 12.5 mcg (12.5 mcg Intravenous Given 03/02/17 1503)     Initial Impression / Assessment and Plan / ED Course  I have reviewed the triage vital signs and the nursing notes.  Pertinent labs & imaging results that were available during my care of the patient were reviewed by me and considered in my medical decision making (see chart for details).  3:07 PM Patient awake and alert, in no distress  per Patient's daughter is now present.  When she was with the patient when she had her episode of seizure-like activity. She acknowledged that the patient has had episodes of syncope in the past, states that this was an unusual episode. They confirm that the patient received Versed, analgesia for her procedure prior to the episode. Patient has had no additional seizure-like nor syncope like episodes since that initial event. Together we had a lengthy conversation about all findings, concern for seizure versus syncope, possibly related to the patient's anesthesia for her procedure. With otherwise reassuring findings, patient will follow-up with both cardiology and neurology for further evaluation and management.   Final Clinical Impressions(s) / ED Diagnoses   Final diagnoses:  Seizure-like activity [facility])     Carmin Muskrat, MD 03/02/17 [phone]

## 2017-03-08 ENCOUNTER — Ambulatory Visit (INDEPENDENT_AMBULATORY_CARE_PROVIDER_SITE_OTHER): Payer: Medicare Other | Admitting: *Deleted

## 2017-03-08 DIAGNOSIS — Z5181 Encounter for therapeutic drug level monitoring: Secondary | ICD-10-CM | POA: Diagnosis not present

## 2017-03-08 DIAGNOSIS — I82621 Acute embolism and thrombosis of deep veins of right upper extremity: Secondary | ICD-10-CM | POA: Diagnosis not present

## 2017-03-08 LAB — POCT INR: INR: 1.4

## 2017-03-29 ENCOUNTER — Ambulatory Visit (INDEPENDENT_AMBULATORY_CARE_PROVIDER_SITE_OTHER): Payer: Medicare Other | Admitting: *Deleted

## 2017-03-29 DIAGNOSIS — I82621 Acute embolism and thrombosis of deep veins of right upper extremity: Secondary | ICD-10-CM | POA: Diagnosis not present

## 2017-03-29 DIAGNOSIS — Z5181 Encounter for therapeutic drug level monitoring: Secondary | ICD-10-CM | POA: Diagnosis not present

## 2017-03-29 LAB — POCT INR: INR: 3

## 2017-04-06 ENCOUNTER — Encounter: Payer: Self-pay | Admitting: Family Medicine

## 2017-04-07 NOTE — Telephone Encounter (Signed)
Nurses-it appears that the patient is sending a message regarding her mother on the patient's my chart.  Unfortunately this makes things confusing.  Please talk with Vickii Chafe find out what the issue is regarding her mother then convert that to a phone message under her mother's name but Thomasenia Bottoms

## 2017-04-12 ENCOUNTER — Other Ambulatory Visit: Payer: Self-pay | Admitting: Cardiology

## 2017-04-19 DIAGNOSIS — H40053 Ocular hypertension, bilateral: Secondary | ICD-10-CM | POA: Diagnosis not present

## 2017-04-19 DIAGNOSIS — Z961 Presence of intraocular lens: Secondary | ICD-10-CM | POA: Diagnosis not present

## 2017-04-26 ENCOUNTER — Ambulatory Visit (INDEPENDENT_AMBULATORY_CARE_PROVIDER_SITE_OTHER): Payer: Medicare Other | Admitting: *Deleted

## 2017-04-26 DIAGNOSIS — I82621 Acute embolism and thrombosis of deep veins of right upper extremity: Secondary | ICD-10-CM | POA: Diagnosis not present

## 2017-04-26 DIAGNOSIS — Z5181 Encounter for therapeutic drug level monitoring: Secondary | ICD-10-CM

## 2017-04-26 DIAGNOSIS — I82A29 Chronic embolism and thrombosis of unspecified axillary vein: Secondary | ICD-10-CM | POA: Diagnosis not present

## 2017-04-26 LAB — POCT INR: INR: 4.2

## 2017-04-26 NOTE — Patient Instructions (Signed)
Hold coumadin tonight then decrease dose to 1/2 tablet daily except 1 tablet on Wednesdays Recheck in 2 weeks

## 2017-04-28 ENCOUNTER — Ambulatory Visit: Payer: Medicare Other | Admitting: Family Medicine

## 2017-04-28 ENCOUNTER — Ambulatory Visit (HOSPITAL_COMMUNITY)
Admission: RE | Admit: 2017-04-28 | Discharge: 2017-04-28 | Disposition: A | Discharge status: Home / Self Care | Payer: Medicare Other | Source: Ambulatory Visit | Attending: Nurse Practitioner | Admitting: Nurse Practitioner

## 2017-04-28 ENCOUNTER — Ambulatory Visit (INDEPENDENT_AMBULATORY_CARE_PROVIDER_SITE_OTHER): Payer: Medicare Other | Admitting: Nurse Practitioner

## 2017-04-28 ENCOUNTER — Encounter: Payer: Self-pay | Admitting: Nurse Practitioner

## 2017-04-28 ENCOUNTER — Other Ambulatory Visit: Payer: Self-pay | Admitting: Family Medicine

## 2017-04-28 VITALS — BP 150/86 | Temp 98.4°F | Ht 60.0 in | Wt 163.0 lb

## 2017-04-28 DIAGNOSIS — R5383 Other fatigue: Secondary | ICD-10-CM | POA: Diagnosis not present

## 2017-04-28 DIAGNOSIS — S20211A Contusion of right front wall of thorax, initial encounter: Secondary | ICD-10-CM | POA: Diagnosis not present

## 2017-04-28 DIAGNOSIS — F411 Generalized anxiety disorder: Secondary | ICD-10-CM | POA: Diagnosis not present

## 2017-04-28 DIAGNOSIS — X58XXXA Exposure to other specified factors, initial encounter: Secondary | ICD-10-CM | POA: Insufficient documentation

## 2017-04-28 DIAGNOSIS — W010XXA Fall on same level from slipping, tripping and stumbling without subsequent striking against object, initial encounter: Secondary | ICD-10-CM | POA: Diagnosis not present

## 2017-04-28 DIAGNOSIS — J40 Bronchitis, not specified as acute or chronic: Secondary | ICD-10-CM | POA: Insufficient documentation

## 2017-04-28 DIAGNOSIS — E7849 Other hyperlipidemia: Secondary | ICD-10-CM

## 2017-04-28 NOTE — Patient Instructions (Signed)
Biofreeze Lidocaine patches

## 2017-05-01 ENCOUNTER — Encounter: Payer: Self-pay | Admitting: Nurse Practitioner

## 2017-05-01 NOTE — Progress Notes (Signed)
Subjective: Presents for complaints of right lower anterior localized pain in the chest just beneath the right breast after falling 6 days ago.  Has tenderness with certain movements or deep breath/cough.  No fever.  No unusual cough.  No shortness of breath.  Also having some issues with her anxiety mainly related to losing her husband and trying to take care of her elderly mother.  Admits that the anxiety is much worse now.  Denies suicidal thoughts or ideation.  Has been on Paxil for years.  Also takes daily Lorazepam.  Objective:   BP (!) 150/86   Temp 98.4 F (36.9 C) (Oral)   Ht 5' (1.524 m)   Wt 163 lb (73.9 kg)   BMI 31.83 kg/m  NAD.  Alert, oriented.  Very anxious affect.  Thoughts logical coherent and relevant.  Dressed appropriately.  Making good eye contact.  Lungs clear.  Heart regular rate and rhythm.  Distinct localized tenderness noted in the right anterior lower chest wall just beneath the right breast, no abdominal tenderness noted.  Assessment:   Problem List Items Addressed This Visit      Other   GAD (generalized anxiety disorder)   Hyperlipidemia   Relevant Orders   Lipid panel    Other Visit Diagnoses    Chest wall contusion, right, initial encounter    -  Primary   Relevant Orders   DG Chest 2 View (Completed)   Fatigue, unspecified type       Relevant Orders   TSH       Plan: Continue Tylenol as directed.  Ice/heat applications.  Lidocaine patch or Biofreeze.  Expect slow gradual resolution.  Chest x-ray pending.  Warning signs reviewed.  Call back if any problems.  Patient defers additional meds at this point for her anxiety.  Discussed importance of stress reduction.

## 2017-05-04 ENCOUNTER — Other Ambulatory Visit: Payer: Self-pay | Admitting: Nurse Practitioner

## 2017-05-04 MED ORDER — CEFDINIR 300 MG PO CAPS: 300.0000 mg | ORAL_CAPSULE | Freq: Two times a day (BID) | ORAL | 0 refills | Status: DC

## 2017-05-08 ENCOUNTER — Encounter: Payer: Self-pay | Admitting: Family Medicine

## 2017-05-09 MED ORDER — MIRTAZAPINE 15 MG PO TABS: 15.0000 mg | ORAL_TABLET | Freq: Every day | ORAL | 2 refills | Status: DC

## 2017-05-09 NOTE — Telephone Encounter (Signed)
Please change her prescription so that it is 1 per evening.  She may have a 30-day supply with 2 refills.  She does need a follow-up visit in the spring

## 2017-05-09 NOTE — Addendum Note (Signed)
Addended by: Dairl Ponder on: 05/09/2017 10:08 AM   Modules accepted: Orders

## 2017-05-10 ENCOUNTER — Ambulatory Visit (INDEPENDENT_AMBULATORY_CARE_PROVIDER_SITE_OTHER): Payer: Medicare Other | Admitting: *Deleted

## 2017-05-10 ENCOUNTER — Other Ambulatory Visit: Payer: Self-pay | Admitting: Family Medicine

## 2017-05-10 DIAGNOSIS — I82621 Acute embolism and thrombosis of deep veins of right upper extremity: Secondary | ICD-10-CM | POA: Diagnosis not present

## 2017-05-10 DIAGNOSIS — Z5181 Encounter for therapeutic drug level monitoring: Secondary | ICD-10-CM | POA: Diagnosis not present

## 2017-05-10 LAB — POCT INR: INR: 1.3

## 2017-05-10 NOTE — Patient Instructions (Signed)
Take coumadin 1 tablet tonight and tomorrow night then increase dose to 1/2 tablet daily except 1 tablet on Wednesdays and Saturdays Recheck in 2 weeks

## 2017-05-11 IMAGING — US US CAROTID DUPLEX BILAT
1 series · 13 of 24 positions shown · non-contrast
Comparison: Prior duplex carotid ultrasound 12/31/2012

CLINICAL DATA: 74-year-old female with symptoms of transient
cerebral ischemia

EXAM:
BILATERAL CAROTID DUPLEX ULTRASOUND
TECHNIQUE: Gray scale imaging, color Doppler and duplex ultrasound were
performed of bilateral carotid and vertebral arteries in the neck.

[Series 1: us carotid duplex bilat · 0.06mm/px · 13 of 70 slices shown]
[im 1/70]
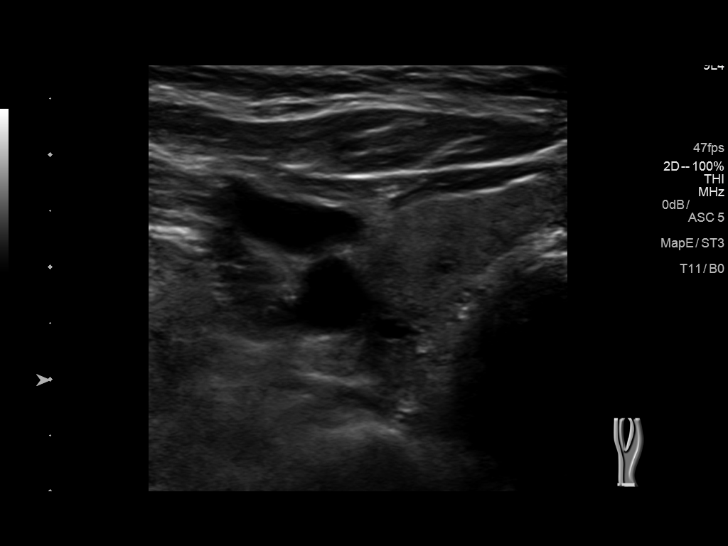
[im 7/70]
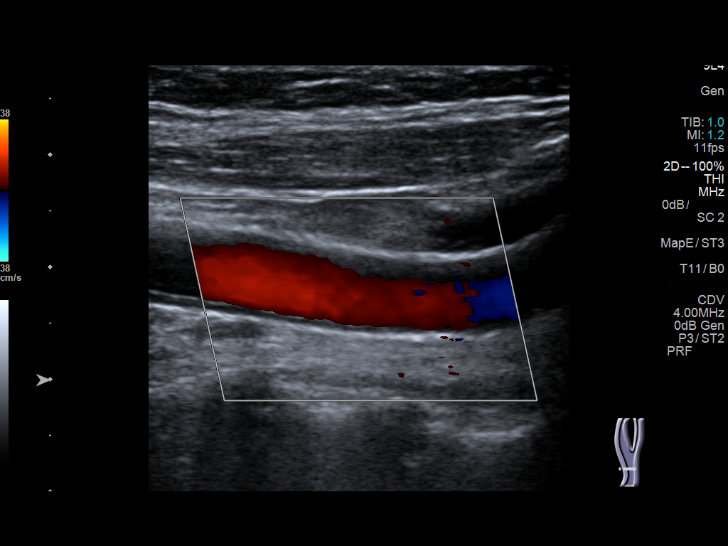
[im 13/70]
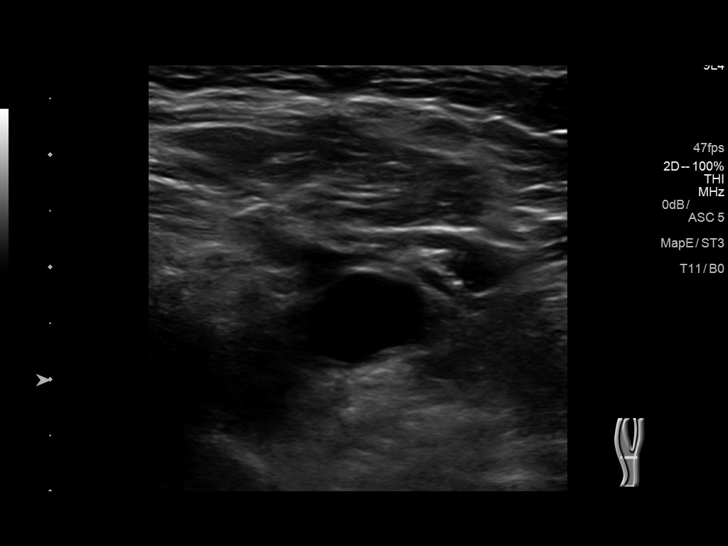
[im 19/70]
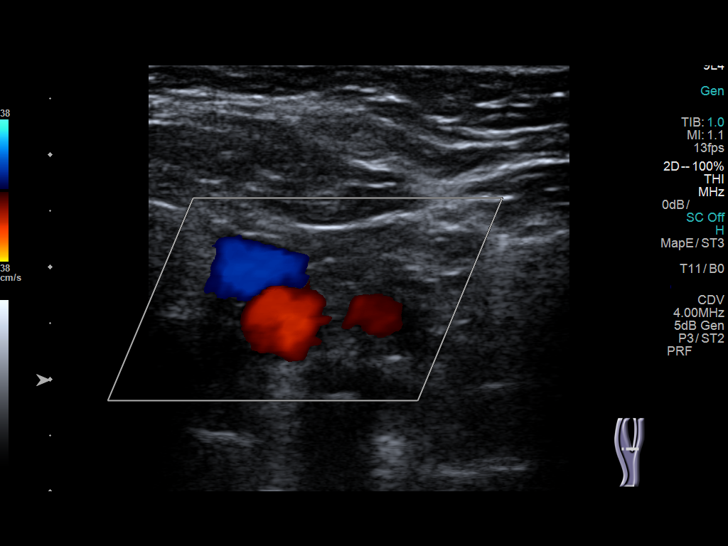
[im 25/70]
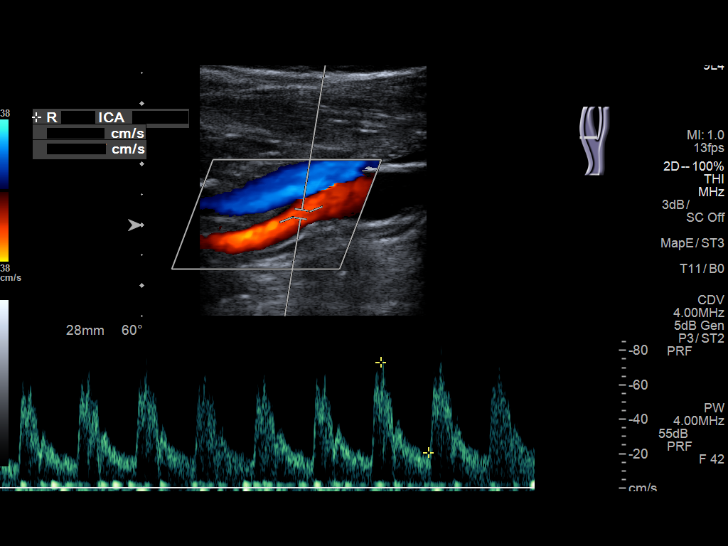
[im 31/70]
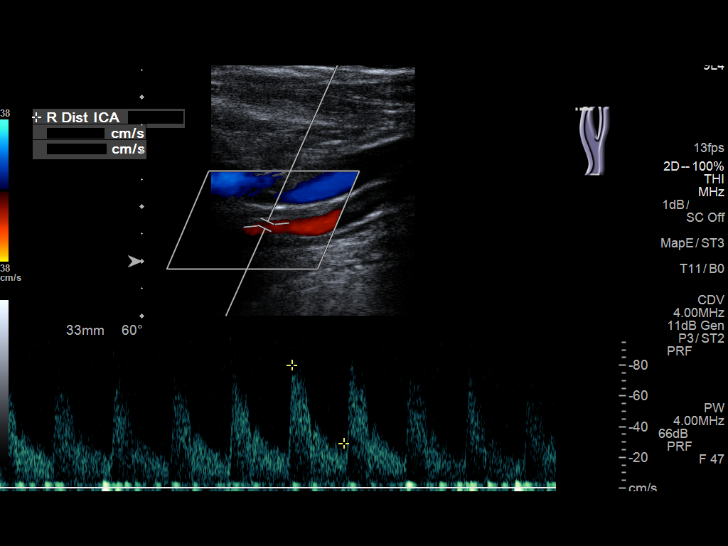
[im 37/70]
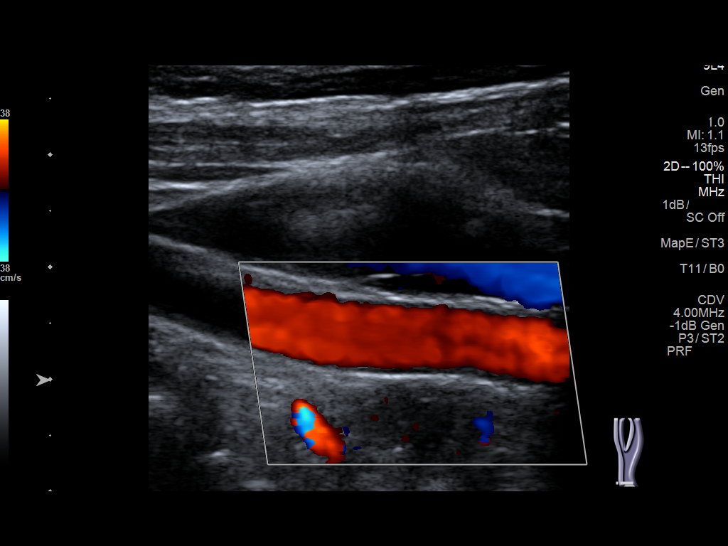
[im 40/70]
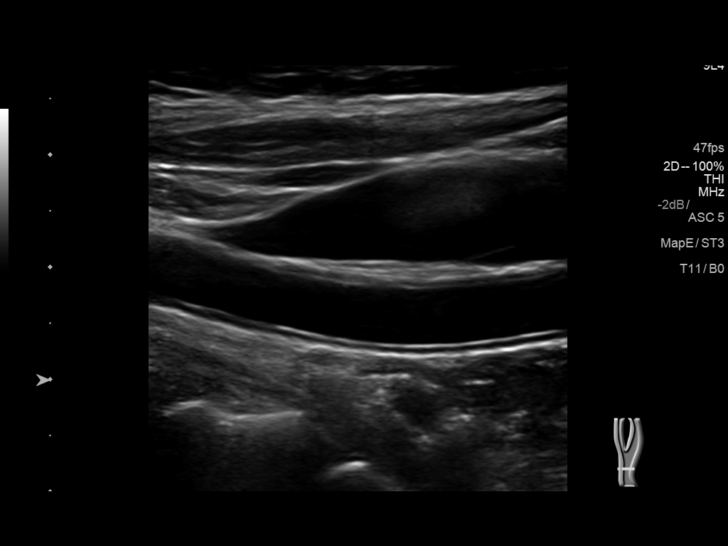
[im 46/70]
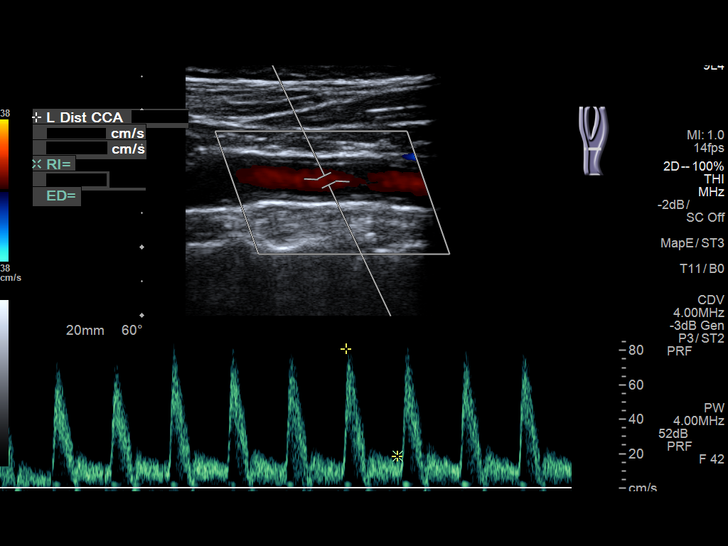
[im 52/70]
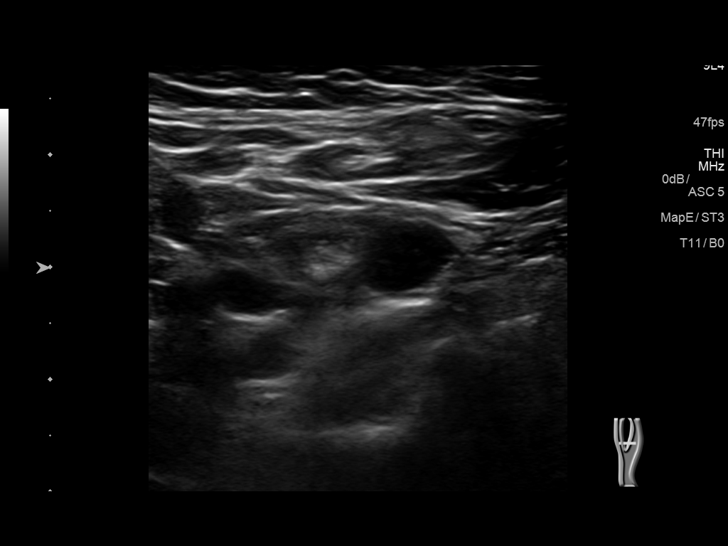
[im 58/70]
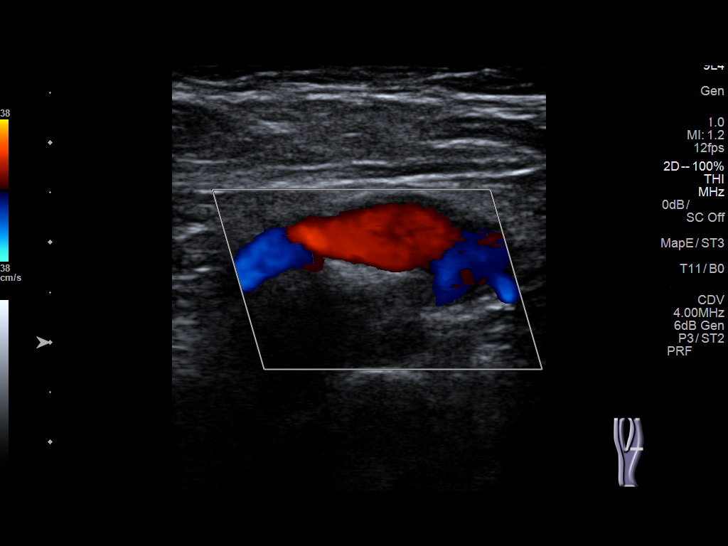
[im 64/70]
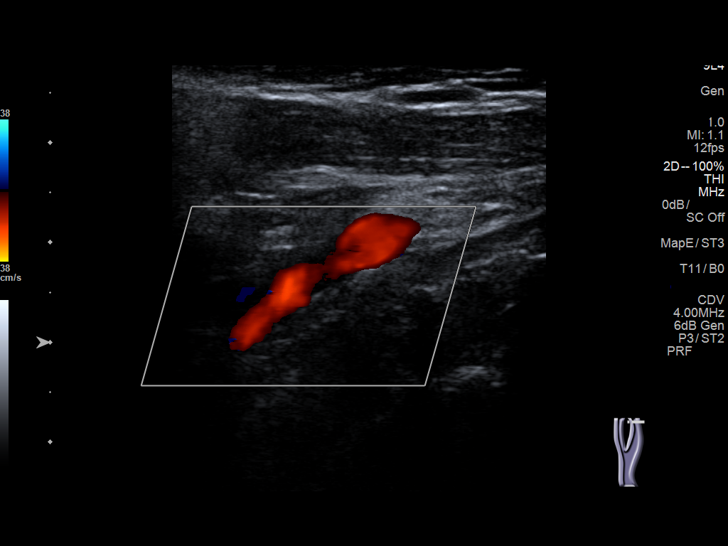
[im 70/70]
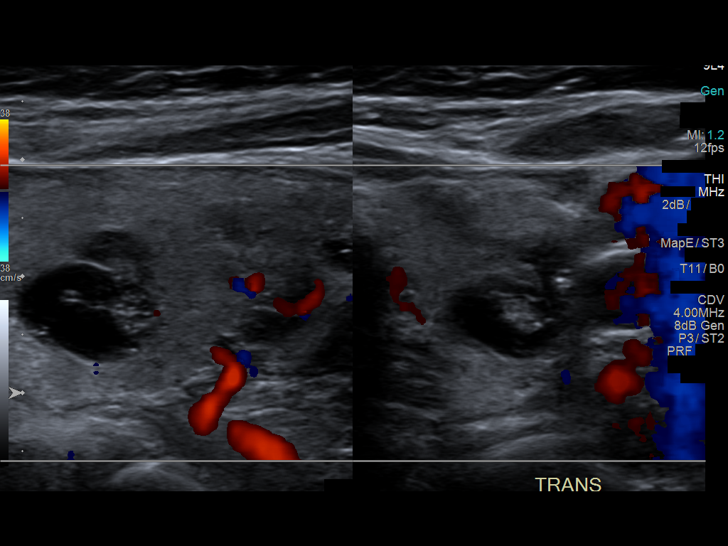

[13 of 24 positions shown; findings below may reference images not displayed]

FINDINGS: Criteria: Quantification of carotid stenosis is based on velocity
parameters that correlate the residual internal carotid diameter
with NASCET-based stenosis levels, using the diameter of the distal
internal carotid lumen as the denominator for stenosis measurement.

The following velocity measurements were obtained:

RIGHT

ICA:  100/28 cm/sec

CCA:  105/17 cm/sec

SYSTOLIC ICA/CCA RATIO:

DIASTOLIC ICA/CCA RATIO:

ECA:  96 cm/sec

LEFT

ICA:  102/91 cm/sec

CCA:  89/14 cm/sec

SYSTOLIC ICA/CCA RATIO:

DIASTOLIC ICA/CCA RATIO:

ECA:  87 cm/sec

RIGHT CAROTID ARTERY: No significant atherosclerotic plaque or
evidence of stenosis.

RIGHT VERTEBRAL ARTERY:  Patent with normal antegrade flow.

LEFT CAROTID ARTERY: Trace smooth heterogeneous atherosclerotic
plaque. By peak systolic velocity criteria the estimated stenosis
remains less than 50%.

LEFT VERTEBRAL ARTERY:  Patent with normal antegrade flow.

Other: Multinodular goiter as previously noted. Patient has had
multiple dedicated thyroid ultrasounds in the past.
IMPRESSION: 1. No significant atherosclerotic plaque or evidence of stenosis in
the right internal carotid artery.
2. Mild (1-49%) stenosis proximal left internal carotid artery
secondary to smooth heterogeneous atherosclerotic plaque.
3. Vertebral arteries are patent with normal antegrade flow.

## 2017-05-29 ENCOUNTER — Ambulatory Visit (INDEPENDENT_AMBULATORY_CARE_PROVIDER_SITE_OTHER): Payer: Medicare Other | Admitting: *Deleted

## 2017-05-29 DIAGNOSIS — I82621 Acute embolism and thrombosis of deep veins of right upper extremity: Secondary | ICD-10-CM | POA: Diagnosis not present

## 2017-05-29 DIAGNOSIS — Z5181 Encounter for therapeutic drug level monitoring: Secondary | ICD-10-CM | POA: Diagnosis not present

## 2017-05-29 LAB — POCT INR: INR: 1.9

## 2017-05-29 NOTE — Patient Instructions (Signed)
Take coumadin 1 tablet tonight then resume 1/2 tablet daily except 1 tablet on Wednesdays and Saturdays Recheck in 3 weeks

## 2017-06-19 ENCOUNTER — Ambulatory Visit (INDEPENDENT_AMBULATORY_CARE_PROVIDER_SITE_OTHER): Payer: Medicare Other | Admitting: *Deleted

## 2017-06-19 DIAGNOSIS — Z5181 Encounter for therapeutic drug level monitoring: Secondary | ICD-10-CM | POA: Diagnosis not present

## 2017-06-19 DIAGNOSIS — I82621 Acute embolism and thrombosis of deep veins of right upper extremity: Secondary | ICD-10-CM

## 2017-06-19 LAB — POCT INR: INR: 2.2

## 2017-06-19 NOTE — Patient Instructions (Signed)
Continue coumadin 1/2 tablet daily except 1 tablet on Wednesdays and Saturdays Recheck in 4 weeks

## 2017-06-20 ENCOUNTER — Ambulatory Visit (HOSPITAL_COMMUNITY)
Admission: RE | Admit: 2017-06-20 | Discharge: 2017-06-20 | Disposition: A | Discharge status: Home / Self Care | Payer: Medicare Other | Source: Ambulatory Visit | Attending: Family Medicine | Admitting: Family Medicine

## 2017-06-20 ENCOUNTER — Encounter: Payer: Self-pay | Admitting: Family Medicine

## 2017-06-20 ENCOUNTER — Ambulatory Visit (INDEPENDENT_AMBULATORY_CARE_PROVIDER_SITE_OTHER): Payer: Medicare Other | Admitting: Family Medicine

## 2017-06-20 VITALS — BP 122/82 | Ht 61.0 in | Wt 165.0 lb

## 2017-06-20 DIAGNOSIS — I70208 Unspecified atherosclerosis of native arteries of extremities, other extremity: Secondary | ICD-10-CM

## 2017-06-20 DIAGNOSIS — M79602 Pain in left arm: Secondary | ICD-10-CM | POA: Diagnosis not present

## 2017-06-20 DIAGNOSIS — R200 Anesthesia of skin: Secondary | ICD-10-CM

## 2017-06-20 DIAGNOSIS — F410 Panic disorder [episodic paroxysmal anxiety] without agoraphobia: Secondary | ICD-10-CM

## 2017-06-20 NOTE — Progress Notes (Signed)
   Subjective:    Patient ID: Jacqueline Morgan, female    DOB: 02/01/1941, 77 y.o.   MRN: [id number]  HPI  Patient arrives with tingling in her left arm. Patietn also feeling nervous and jittery -like a panic attack. Patient relates she feels stressed and anxious.  Nervous.  She is not exactly sure why.  Denies any chest tightness pressure pain shortness of breath She does relate numbness in her left arm and a funny sensation in her arm that she states is very similar to when she had a blood clot Review of Systems  Constitutional: Negative for activity change, fatigue and fever.  HENT: Negative for congestion.   Respiratory: Negative for cough, chest tightness and shortness of breath.   Cardiovascular: Negative for chest pain and leg swelling.  Gastrointestinal: Negative for abdominal pain.  Skin: Negative for color change.  Neurological: Positive for numbness. Negative for headaches.  Psychiatric/Behavioral: Negative for behavioral problems. The patient is nervous/anxious.    Past medical history reviewed-consistent for hypertension brachial artery embolus peripheral artery disease and history of DVT Patient does not smoke or drink    Objective:   Physical Exam  Lungs are clear respiratory rate is normal no crackles heart regular no murmurs pulses normal extremities no edema skin warm dry good strength in the arms patient very nervous shaking trembling anxious  Stat testing ordered to rule out the possibility of blood clot if this test is negative further testing is not necessary    Assessment & Plan:  Very nice patient She is extremely concerned about the possibility of blood clot because her symptoms are similar to what it was before We will do a stat ultrasound await the results Find no evidence of cardiac or pulmonary disease She does have significant anxiety increase lorazepam she may use 1 mg tablet take 1 in the morning 1 in the afternoon 1 in the evening for the next 2-3  days then reduce it to 1 tablet twice daily with 1/2 tablet in between She is to do a follow-up office visit somewhere in the late spring

## 2017-06-21 ENCOUNTER — Ambulatory Visit (HOSPITAL_COMMUNITY): Payer: Medicare Other

## 2017-06-23 ENCOUNTER — Ambulatory Visit (INDEPENDENT_AMBULATORY_CARE_PROVIDER_SITE_OTHER): Payer: Medicare Other | Admitting: Cardiology

## 2017-06-23 ENCOUNTER — Encounter: Payer: Self-pay | Admitting: Cardiology

## 2017-06-23 VITALS — BP 150/80 | HR 97 | Ht 60.0 in | Wt 165.2 lb

## 2017-06-23 DIAGNOSIS — I70208 Unspecified atherosclerosis of native arteries of extremities, other extremity: Secondary | ICD-10-CM | POA: Diagnosis not present

## 2017-06-23 DIAGNOSIS — E782 Mixed hyperlipidemia: Secondary | ICD-10-CM

## 2017-06-23 DIAGNOSIS — Z7901 Long term (current) use of anticoagulants: Secondary | ICD-10-CM

## 2017-06-23 DIAGNOSIS — R0789 Other chest pain: Secondary | ICD-10-CM | POA: Diagnosis not present

## 2017-06-23 DIAGNOSIS — R002 Palpitations: Secondary | ICD-10-CM

## 2017-06-23 NOTE — Progress Notes (Signed)
Clinical Summary Jacqueline Morgan is a 77 y.o.female seen today for follow up of the following medical problems.   1. 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  - underwent left arm thromboembolectomy 12/19/12 by Dr Adele Barthel  - echo was negative for thrombus. There was some question of possible aortic arch atherosclerosis. Discharged on coumadin. - denies any bleeding issues on coumadin. Notes some occsaional pain in left arm with activity that is unchanged..  - has not been in favor of NOACs    - recent left arm pain. WBIs ordered by PCP were Jacqueline - pain is resolving.    2. Palpitations - previous event monitor shows symptoms correlate with NSR.   - has palpitations at times. Often brought on with her anxiety.    3. Chest pain - recent episodes of chest pain last week - 10/2015 lexiscan without ischemia.  - 07/2015 echo LVEF 86-57%, grade I diastolic dysfunction  - denies any recent symptoms.   4. Hyperlipidemia -she is compliant with statin.       SH: sister passed away a few weeks ago Jacqueline Morgan, who was also a patient.   Past Medical History:  Diagnosis Date  . Anxiety   . Depression   . Fibromyalgia   . GERD (gastroesophageal reflux disease)   . Hyperlipidemia   . Hypertension   . Palpitations   . 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 Codeine     REACTION: Adverse gastrointestinal effects  . Statins     Felt bad     Current Outpatient Medications  Medication Sig Dispense Refill  . acetaminophen (TYLENOL) 500 MG tablet Take 1,000 mg by mouth once as needed. For headache/pain    . calcium carbonate (OS-CAL) 600 MG TABS Take 600 mg by mouth daily.      [name] LORazepam (ATIVAN) 1 MG tablet 1 late morning,1/2 late afternoon then 1 q evening 75 tablet 5  . metoprolol succinate (TOPROL-XL) 25 MG 24 hr tablet TAKE 1 TABLET BY MOUTH   DAILY WITH OR IMMEDIATLEY  FOLLOWING A MEAL 90 tablet 1  . mirtazapine (REMERON) 15 MG tablet Take 1 tablet (15 mg total) by mouth at bedtime. 30 tablet 2  . ondansetron (ZOFRAN-ODT) 8 MG disintegrating tablet TAKE 1 TABLET BY MOUTH EVERY 8 HOURS AS NEEDED FOR NAUSEA OR VOMITING. 30 tablet 0  . pantoprazole (PROTONIX) 40 MG tablet TAKE 1 TABLET BY MOUTH TWO  TIMES DAILY 180 tablet 1  . PARoxetine (PAXIL) 20 MG tablet TAKE 1 TABLET IN THE  MORNING AND 2 TABLETS IN  THE EVENING 270 tablet 1  . sucralfate (CARAFATE) 1 g tablet TAKE 1 TABLET BY MOUTH BEFORE MEALS AND AT BEDTIME. (Patient taking differently: As needed) 120 tablet 5  . warfarin (COUMADIN) 5 MG tablet Take 1/2 tablet daily except 1 tablet on Sundays and Wednesdays 20 tablet 6   No current facility-administered medications for this visit.      Past Surgical History:  Procedure Laterality Date  . COLONOSCOPY N/A 04/01/2015   Procedure: COLONOSCOPY;  Surgeon: Rogene Houston, MD;  Location: [facility];  Service: Endoscopy;  Laterality: N/A;  1:45  . EMBOLECTOMY Left 12/19/2012   Procedure: EMBOLECTOMY OF LEFT BRACHIAL ARTERY;  Surgeon: Conrad Bolivar, MD;  Location: [facility];  Service: Vascular;  Laterality: Left;  . EYE SURGERY  Allergies  Allergen Reactions  . Codeine     REACTION: Adverse gastrointestinal effects  . Statins     Felt bad      Family History  Problem Relation Age of Onset  . Cancer Mother        ovarian, colon  . Heart disease Mother   . Heart attack Father   . Glaucoma Father   . COPD Sister   . Benign prostatic hyperplasia Brother      Social History Ms. Pinette reports that she has never smoked. She has never used smokeless tobacco. Ms. Skilton reports that she does not drink alcohol.   Review of Systems CONSTITUTIONAL: No weight loss, fever, chills, weakness or fatigue.  HEENT: Eyes: No visual loss, blurred vision, double vision or yellow sclerae.No hearing loss, sneezing,  congestion, runny nose or sore throat.  SKIN: No rash or itching.  CARDIOVASCULAR: per hpi RESPIRATORY: No shortness of breath, cough or sputum.  GASTROINTESTINAL: No anorexia, nausea, vomiting or diarrhea. No abdominal pain or blood.  GENITOURINARY: No burning on urination, no polyuria NEUROLOGICAL: No headache, dizziness, syncope, paralysis, ataxia, numbness or tingling in the extremities. No change in bowel or bladder control.  MUSCULOSKELETAL: No muscle, back pain, joint pain or stiffness.  LYMPHATICS: No enlarged nodes. No history of splenectomy.  PSYCHIATRIC: +anxiety ENDOCRINOLOGIC: No reports of sweating, cold or heat intolerance. No polyuria or polydipsia.  [name]   Physical Examination Vitals:   06/23/17 1316  BP: (!) 150/80  Pulse: 97  SpO2: 91%   Vitals:   06/23/17 1316  Weight: 165 lb 3.2 oz (74.9 kg)  Height: 5' (1.524 m)    Gen: resting comfortably, no acute distress HEENT: no scleral icterus, pupils equal round and reactive, no palptable cervical adenopathy,  CV: RRR, no m/r/g, no jvd Resp: Clear to auscultation bilaterally GI: abdomen is soft, non-tender, non-distended, Jacqueline bowel sounds, no hepatosplenomegaly MSK: extremities are warm, no edema.  Skin: warm, no rash Neuro:  no focal deficits Psych: appropriate affect   Diagnostic Studies 05/2013 21 day Event monitor No arrythmias  07/2015 echo Study Conclusions  - Left ventricle: The cavity size was Jacqueline. Wall thickness was  increased in a pattern of mild LVH. Systolic function was Jacqueline.  The estimated ejection fraction was in the range of 60% to 65%.  Wall motion was Jacqueline; there were no regional wall motion  abnormalities. Doppler parameters are consistent with abnormal  left ventricular relaxation (grade 1 diastolic dysfunction). - Aortic valve: Mildly calcified annulus. Trileaflet; mildly  thickened leaflets. Valve area (VTI): 2.21 cm^2. Valve area  (Vmax): 2.35 cm^2. - Mitral  valve: Mildly calcified annulus. Mildly thickened leaflets  . - Technically adequate study.  07/2015 LUE Arterial US IMPRESSION: Left upper extremity arterial noninvasive study demonstrates no evidence of significant arterial occlusive disease.  10/2015 Lexiscan MPI  There was no ST segment deviation noted during stress.  The study is Jacqueline. There are no perfusion defects consistent with prior infarct or current ischemia.  This is a low risk study.  The left ventricular ejection fraction is Jacqueline (55-65%).     Assessment and Plan  1. Left brachial artery occlusion  - secondary to thromboembolism, unclear source of the embolism (cardiac vs aortic atheroscerlosis)  - continue anticoagluatio  2. Palpitations - nevative event monitor 05/2013. Hx of anxiety, probable etiology - continue to monitor at this time   3. Chest pain /SOB - negative stress test previously. No recent symptoms, continue to monitor.  4. Hyperlipidemia - only tolerating crestor every other day - continue current meds  5.Anxiety - she is not interested in referal to psych at this time.      F/u 6 months       Arnoldo Lenis, M.D.

## 2017-06-23 NOTE — Patient Instructions (Signed)
Your physician wants you to follow-up in:6 months  with Dr.Branch You will receive a reminder letter in the mail two months in advance. If you don't receive a letter, please call our office to schedule the follow-up appointment.   Your physician recommends that you continue on your current medications as directed. Please refer to the Current Medication list given to you today.    If you need a refill on your cardiac medications before your next appointment, please call your pharmacy.      No lab work or tests ordered today.       Thank you for choosing [facility] !         

## 2017-06-28 ENCOUNTER — Encounter: Payer: Self-pay | Admitting: Cardiology

## 2017-07-19 ENCOUNTER — Ambulatory Visit (INDEPENDENT_AMBULATORY_CARE_PROVIDER_SITE_OTHER): Payer: Medicare Other | Admitting: *Deleted

## 2017-07-19 DIAGNOSIS — Z5181 Encounter for therapeutic drug level monitoring: Secondary | ICD-10-CM | POA: Diagnosis not present

## 2017-07-19 DIAGNOSIS — I82621 Acute embolism and thrombosis of deep veins of right upper extremity: Secondary | ICD-10-CM

## 2017-07-19 LAB — POCT INR: INR: 1.9

## 2017-07-19 NOTE — Patient Instructions (Signed)
Increase coumadin to 1/2 tablet daily except 1 tablet on Mondays, Wednesdays and Fridays Recheck in 3 weeks  

## 2017-08-07 ENCOUNTER — Ambulatory Visit (INDEPENDENT_AMBULATORY_CARE_PROVIDER_SITE_OTHER): Payer: Medicare Other | Admitting: *Deleted

## 2017-08-07 DIAGNOSIS — I82621 Acute embolism and thrombosis of deep veins of right upper extremity: Secondary | ICD-10-CM

## 2017-08-07 DIAGNOSIS — I82622 Acute embolism and thrombosis of deep veins of left upper extremity: Secondary | ICD-10-CM | POA: Diagnosis not present

## 2017-08-07 DIAGNOSIS — Z5181 Encounter for therapeutic drug level monitoring: Secondary | ICD-10-CM

## 2017-08-07 LAB — POCT INR: INR: 2.8

## 2017-08-07 NOTE — Patient Instructions (Signed)
Continue coumadin 1/2 tablet daily except 1 tablet on Mondays, Wednesdays and Fridays Recheck in 4 weeks 

## 2017-08-21 ENCOUNTER — Other Ambulatory Visit: Payer: Self-pay | Admitting: Family Medicine

## 2017-09-01 ENCOUNTER — Other Ambulatory Visit: Payer: Self-pay | Admitting: Family Medicine

## 2017-09-04 ENCOUNTER — Ambulatory Visit (INDEPENDENT_AMBULATORY_CARE_PROVIDER_SITE_OTHER): Payer: Medicare Other | Admitting: *Deleted

## 2017-09-04 DIAGNOSIS — Z5181 Encounter for therapeutic drug level monitoring: Secondary | ICD-10-CM

## 2017-09-04 DIAGNOSIS — I82621 Acute embolism and thrombosis of deep veins of right upper extremity: Secondary | ICD-10-CM

## 2017-09-04 LAB — POCT INR: INR: 3 (ref 2.0–3.0)

## 2017-09-04 NOTE — Patient Instructions (Signed)
Continue coumadin 1/2 tablet daily except 1 tablet on Mondays, Wednesdays and Fridays Increase greens/salad Recheck in 5 weeks

## 2017-09-07 ENCOUNTER — Telehealth: Payer: Medicare Other | Admitting: Physician Assistant

## 2017-09-07 ENCOUNTER — Encounter: Payer: Self-pay | Admitting: Nurse Practitioner

## 2017-09-07 DIAGNOSIS — N939 Abnormal uterine and vaginal bleeding, unspecified: Secondary | ICD-10-CM

## 2017-09-07 DIAGNOSIS — N898 Other specified noninflammatory disorders of vagina: Secondary | ICD-10-CM

## 2017-09-07 NOTE — Progress Notes (Signed)
Based on what you shared with me it looks like you have a serious condition that should be evaluated in a face to face office visit. Any time there is vaginal pain and bleeding (or blood with wiping) in a post-menopausal woman, this requires a face to face assessment for urine testing, and pelvic examination to rule out any more worrisome causes of these symptoms and to make sure the appropriate treatment is given.   NOTE: If you entered your credit card information for this eVisit, you will not be charged. You may see a "hold" on your card for the $30 but that hold will drop off and you will not have a charge processed.  If you are having a true medical emergency please call 911.  If you need an urgent face to face visit, [facility] has four urgent care centers for your convenience.  If you need care fast and have a high deductible or no insurance consider:   [email or web address] to reserve your spot online an avoid wait times  [facility] [address] [place], [place] [address] 8 am to 8 pm Monday-Friday 10 am to 4 pm Saturday-Sunday *Across the street from [employer]  [place], [address] 8 am to 5 pm Monday-Friday * In the [facility] on the [facility]   The following sites will take your  insurance:  . [facility] [place] a Provider at this Location  [address] [place], [place] [address] . 10 am to 8 pm Monday-Friday . 12 pm to 8 pm Saturday-Sunday   . [facility] [place] a Provider at this Location  [address] [place] [place], [place], [place] [address] . 8 am to 8 pm Monday-Friday . 9 am to 6 pm Saturday . 11 am to 6 pm Sunday   . [facility] [place] Get Driving Directions  [address]  [place], [place] [address] . 8 am to 8 pm Monday-Friday . 8 am to 4 pm Saturday-Sunday   Your e-visit answers were reviewed by a board certified advanced clinical practitioner to complete your personal care plan.  Thank you for using e-Visits.

## 2017-09-10 ENCOUNTER — Other Ambulatory Visit: Payer: Self-pay | Admitting: Family Medicine

## 2017-09-12 NOTE — Telephone Encounter (Signed)
May have 4 months refills on both of these needs follow-up office visit this fall

## 2017-09-20 ENCOUNTER — Telehealth: Payer: Self-pay | Admitting: Nurse Practitioner

## 2017-09-20 ENCOUNTER — Ambulatory Visit (INDEPENDENT_AMBULATORY_CARE_PROVIDER_SITE_OTHER): Payer: Medicare Other | Admitting: Nurse Practitioner

## 2017-09-20 ENCOUNTER — Encounter: Payer: Self-pay | Admitting: Nurse Practitioner

## 2017-09-20 ENCOUNTER — Other Ambulatory Visit: Payer: Self-pay | Admitting: Nurse Practitioner

## 2017-09-20 VITALS — BP 132/84 | Temp 98.5°F | Ht 60.0 in | Wt 165.4 lb

## 2017-09-20 DIAGNOSIS — I10 Essential (primary) hypertension: Secondary | ICD-10-CM

## 2017-09-20 DIAGNOSIS — Z1329 Encounter for screening for other suspected endocrine disorder: Secondary | ICD-10-CM

## 2017-09-20 DIAGNOSIS — I70208 Unspecified atherosclerosis of native arteries of extremities, other extremity: Secondary | ICD-10-CM | POA: Diagnosis not present

## 2017-09-20 DIAGNOSIS — E7849 Other hyperlipidemia: Secondary | ICD-10-CM

## 2017-09-20 DIAGNOSIS — R300 Dysuria: Secondary | ICD-10-CM

## 2017-09-20 DIAGNOSIS — Z1211 Encounter for screening for malignant neoplasm of colon: Secondary | ICD-10-CM

## 2017-09-20 LAB — POCT URINALYSIS DIPSTICK
RBC UA: NEGATIVE
SPEC GRAV UA: 1.02 (ref 1.010–1.025)
pH, UA: 6 (ref 5.0–8.0)

## 2017-09-20 LAB — POCT UA - MICROSCOPIC ONLY: RBC, urine, microscopic: NEGATIVE

## 2017-09-20 MED ORDER — CEFDINIR 300 MG PO CAPS: 300.0000 mg | ORAL_CAPSULE | Freq: Two times a day (BID) | ORAL | 0 refills | Status: DC

## 2017-09-20 MED ORDER — ONDANSETRON 8 MG PO TBDP: ORAL_TABLET | ORAL | 0 refills | Status: DC

## 2017-09-20 NOTE — Telephone Encounter (Signed)
Labs placed. Phone kept ringing; no answering machine

## 2017-09-20 NOTE — Telephone Encounter (Signed)
Labs have been ordered over the past few years but not done. Please get comprehensive met profile, lipid and TSH. Thanks.

## 2017-09-20 NOTE — Telephone Encounter (Signed)
Pt wanted to know if you wanted to check her thyroid.

## 2017-09-20 NOTE — Telephone Encounter (Signed)
I spoke with the pt Dtr she will make her mother aware.

## 2017-09-21 ENCOUNTER — Encounter: Payer: Self-pay | Admitting: Nurse Practitioner

## 2017-09-21 NOTE — Progress Notes (Signed)
Subjective: Presents for complaints of a smear of blood recently near her private area, is unclear whether it is vaginal or rectal.  Patient is due for her colonoscopy again in December.  No pelvic pain.  No new sexual partners.  No fever.  No vaginal discharge.  Has had some dysuria for the past couple of days.  No change in urgency or frequency.  Continues to struggle with reflux.  Has seen Dr. Laural Golden for this in the past.  Continues to see cardiology on a regular basis.  Objective:   BP 132/84   Temp 98.5 F (36.9 C) (Oral)   Ht 5' (1.524 m)   Wt 165 lb 6.4 oz (75 kg)   SpO2 92%   BMI 32.30 kg/m  NAD.  Alert, oriented.  Lungs clear.  Heart regular rate rhythm.  Abdomen soft with distinct epigastric area tenderness.  External GU tissue pale and dry, no lesions or irritation noted.  Vagina also pale and dry no discharge noted.  Bimanual exam no tenderness or obvious masses. Results for orders placed or performed in visit on 09/20/17  POCT urinalysis dipstick  Result Value Ref Range   Color, UA     Clarity, UA     Glucose, UA  Negative   Bilirubin, UA     Ketones, UA     Spec Grav, UA 1.020 1.010 - 1.025   Blood, UA Negative    pH, UA 6.0 5.0 - 8.0   Protein, UA  Negative   Urobilinogen, UA  0.2 or 1.0 E.U./dL   Nitrite, UA     Leukocytes, UA Large (3+) (A) Negative   Appearance     Odor    POCT UA - Microscopic Only  Result Value Ref Range   WBC, Ur, HPF, POC 0-3    RBC, urine, microscopic neg    Bacteria, U Microscopic many    Mucus, UA     Epithelial cells, urine per micros     Crystals, Ur, HPF, POC     Casts, Ur, LPF, POC     Yeast, UA     Microscopic exam was done after the patient's visit.  A note was sent to the nurses to notify her of results and that antibiotic would be called in.  Assessment:  Dysuria - Plan: POCT urinalysis dipstick, Urine Culture, POCT UA - Microscopic Only  Screening for colon cancer - Plan: IFOBT POC (occult bld, rslt in office)    Plan:    Meds ordered this encounter  Medications  . ondansetron (ZOFRAN-ODT) 8 MG disintegrating tablet    Sig: TAKE 1 TABLET BY MOUTH EVERY 8 HOURS AS NEEDED FOR NAUSEA OR VOMITING.    Dispense:  60 tablet    Refill:  0    Order Specific Question:   Supervising Provider    Answer:   Mikey Kirschner [[id number]]  . cefdinir (OMNICEF) 300 MG capsule    Sig: Take 1 capsule (300 mg total) by mouth 2 (two) times daily.    Dispense:  14 capsule    Refill:  0    Order Specific Question:   Supervising Provider    Answer:   Mikey Kirschner [[id number]]   Urine culture pending.  Patient will do I FOBT to screen for blood in her stool but also recommend that she get back with GI specialist in the near future for reflux and to set up a date for her repeat colonoscopy this fall.  Warning signs reviewed.  Call  back if symptoms worsen or persist.

## 2017-09-24 LAB — URINE CULTURE

## 2017-09-24 LAB — SPECIMEN STATUS REPORT

## 2017-10-09 ENCOUNTER — Ambulatory Visit (INDEPENDENT_AMBULATORY_CARE_PROVIDER_SITE_OTHER): Payer: Medicare Other | Admitting: *Deleted

## 2017-10-09 DIAGNOSIS — I82621 Acute embolism and thrombosis of deep veins of right upper extremity: Secondary | ICD-10-CM

## 2017-10-09 DIAGNOSIS — Z5181 Encounter for therapeutic drug level monitoring: Secondary | ICD-10-CM

## 2017-10-09 LAB — POCT INR: INR: 2.8 (ref 2.0–3.0)

## 2017-10-09 NOTE — Patient Instructions (Signed)
Continue coumadin 1/2 tablet daily except 1 tablet on Mondays, Wednesdays and Fridays Continue greens/salad Recheck in 6 weeks 

## 2017-10-23 DIAGNOSIS — E7849 Other hyperlipidemia: Secondary | ICD-10-CM | POA: Diagnosis not present

## 2017-10-23 DIAGNOSIS — Z1329 Encounter for screening for other suspected endocrine disorder: Secondary | ICD-10-CM | POA: Diagnosis not present

## 2017-10-23 DIAGNOSIS — I10 Essential (primary) hypertension: Secondary | ICD-10-CM | POA: Diagnosis not present

## 2017-10-24 LAB — COMPREHENSIVE METABOLIC PANEL
A/G RATIO: 1.3 (ref 1.2–2.2)
ALBUMIN: 4 g/dL (ref 3.5–4.8)
ALK PHOS: 104 IU/L (ref 39–117)
ALT: 23 IU/L (ref 0–32)
AST: 31 IU/L (ref 0–40)
BILIRUBIN TOTAL: 0.3 mg/dL (ref 0.0–1.2)
BUN / CREAT RATIO: 11 — AB (ref 12–28)
BUN: 10 mg/dL (ref 8–27)
CHLORIDE: 101 mmol/L (ref 96–106)
CO2: 21 mmol/L (ref 20–29)
Calcium: 8.7 mg/dL (ref 8.7–10.3)
Creatinine, Ser: 0.92 mg/dL (ref 0.57–1.00)
GFR calc Af Amer: 70 mL/min/{1.73_m2} (ref >59)
GFR calc non Af Amer: 61 mL/min/{1.73_m2} (ref >59)
GLOBULIN, TOTAL: 3.2 g/dL (ref 1.5–4.5)
Glucose: 105 mg/dL — ABNORMAL HIGH (ref 65–99)
POTASSIUM: 4.2 mmol/L (ref 3.5–5.2)
SODIUM: 140 mmol/L (ref 134–144)
Total Protein: 7.2 g/dL (ref 6.0–8.5)

## 2017-10-24 LAB — LIPID PANEL
Chol/HDL Ratio: 10.2 ratio — ABNORMAL HIGH (ref 0.0–4.4)
Cholesterol, Total: 368 mg/dL — ABNORMAL HIGH (ref 100–199)
HDL: 36 mg/dL — ABNORMAL LOW (ref >39)
LDL CALC: 257 mg/dL — AB (ref 0–99)
TRIGLYCERIDES: 374 mg/dL — AB (ref 0–149)
VLDL CHOLESTEROL CAL: 75 mg/dL — AB (ref 5–40)

## 2017-10-24 LAB — TSH: TSH: 1.09 u[IU]/mL (ref 0.450–4.500)

## 2017-10-25 ENCOUNTER — Encounter: Payer: Self-pay | Admitting: Nurse Practitioner

## 2017-10-25 DIAGNOSIS — R7303 Prediabetes: Secondary | ICD-10-CM | POA: Insufficient documentation

## 2017-10-30 ENCOUNTER — Other Ambulatory Visit: Payer: Self-pay | Admitting: Family Medicine

## 2017-11-01 ENCOUNTER — Other Ambulatory Visit: Payer: Self-pay

## 2017-11-07 ENCOUNTER — Other Ambulatory Visit: Payer: Self-pay | Admitting: Cardiology

## 2017-11-10 ENCOUNTER — Encounter: Payer: Self-pay | Admitting: *Deleted

## 2017-11-24 ENCOUNTER — Ambulatory Visit (INDEPENDENT_AMBULATORY_CARE_PROVIDER_SITE_OTHER): Payer: Medicare Other | Admitting: *Deleted

## 2017-11-24 DIAGNOSIS — Z5181 Encounter for therapeutic drug level monitoring: Secondary | ICD-10-CM

## 2017-11-24 DIAGNOSIS — I82621 Acute embolism and thrombosis of deep veins of right upper extremity: Secondary | ICD-10-CM

## 2017-11-24 LAB — POCT INR: INR: 3.1 — AB (ref 2.0–3.0)

## 2017-11-24 NOTE — Patient Instructions (Addendum)
Description   Today take 1/2 tablet, then Continue coumadin 1/2 tablet daily except 1 tablet on Mondays, Wednesdays and Fridays Continue greens/salad Recheck in 4-5 weeks

## 2017-12-01 ENCOUNTER — Other Ambulatory Visit: Payer: Self-pay | Admitting: Cardiology

## 2017-12-06 DIAGNOSIS — Z124 Encounter for screening for malignant neoplasm of cervix: Secondary | ICD-10-CM | POA: Diagnosis not present

## 2017-12-06 DIAGNOSIS — N8500 Endometrial hyperplasia, unspecified: Secondary | ICD-10-CM | POA: Diagnosis not present

## 2017-12-27 ENCOUNTER — Ambulatory Visit (INDEPENDENT_AMBULATORY_CARE_PROVIDER_SITE_OTHER): Payer: Medicare Other | Admitting: *Deleted

## 2017-12-27 DIAGNOSIS — I742 Embolism and thrombosis of arteries of the upper extremities: Secondary | ICD-10-CM

## 2017-12-27 DIAGNOSIS — I82621 Acute embolism and thrombosis of deep veins of right upper extremity: Secondary | ICD-10-CM | POA: Diagnosis not present

## 2017-12-27 DIAGNOSIS — Z5181 Encounter for therapeutic drug level monitoring: Secondary | ICD-10-CM | POA: Diagnosis not present

## 2017-12-27 LAB — POCT INR: INR: 3.3 — AB (ref 2.0–3.0)

## 2017-12-27 NOTE — Patient Instructions (Signed)
Decrease coumadin to 1/2 tablet daily except 1 tablet on Mondays and Fridays Continue greens/salad Recheck in 4 weeks

## 2017-12-29 ENCOUNTER — Other Ambulatory Visit: Payer: Self-pay | Admitting: Family Medicine

## 2018-01-09 ENCOUNTER — Ambulatory Visit (INDEPENDENT_AMBULATORY_CARE_PROVIDER_SITE_OTHER): Payer: Medicare Other | Admitting: Cardiology

## 2018-01-09 ENCOUNTER — Encounter: Payer: Self-pay | Admitting: Cardiology

## 2018-01-09 VITALS — BP 134/74 | HR 77 | Ht 61.0 in | Wt 165.0 lb

## 2018-01-09 DIAGNOSIS — I742 Embolism and thrombosis of arteries of the upper extremities: Secondary | ICD-10-CM

## 2018-01-09 DIAGNOSIS — R002 Palpitations: Secondary | ICD-10-CM | POA: Diagnosis not present

## 2018-01-09 DIAGNOSIS — I70208 Unspecified atherosclerosis of native arteries of extremities, other extremity: Secondary | ICD-10-CM | POA: Diagnosis not present

## 2018-01-09 DIAGNOSIS — E782 Mixed hyperlipidemia: Secondary | ICD-10-CM | POA: Diagnosis not present

## 2018-01-09 MED ORDER — PRAVASTATIN SODIUM 20 MG PO TABS: 20.0000 mg | ORAL_TABLET | Freq: Every evening | ORAL | 3 refills | Status: DC

## 2018-01-09 NOTE — Patient Instructions (Addendum)
Medication Instructions:  START PRAVASTATIN 20 MG DAILY   Labwork: NONE  Testing/Procedures: NONE  Follow-Up: Your physician recommends that you schedule a follow-up appointment in:6 WEEKS    Any Other Special Instructions Will Be Listed Below (If Applicable).     If you need a refill on your cardiac medications before your next appointment, please call your pharmacy.

## 2018-01-09 NOTE — Progress Notes (Signed)
Clinical Summary Jacqueline Morgan is a 77 y.o.female seen today for follow up of the following medical problems.   1. 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  - underwent left arm thromboembolectomy 12/19/12 by Dr Adele Barthel  - echo was negative for thrombus. There was some question of possible aortic arch atherosclerosis. Discharged on coumadin. - denies any bleeding issues on coumadin. Notes some occsaional pain in left arm with activity that is unchanged..  - has not been in favor of NOACs   - no bleeding on coumadin. No recent arm pains.    2. Palpitations - previous event monitor shows symptoms correlate with NSR.  - no recent symptoms.    3. Chest pain -recentepisodes of chest pain last week - 10/2015 lexiscan without ischemia.  - 07/2015 echo LVEF 98-11%, grade I diastolic dysfunction  - SOB on hot days only. No recent chest pain.   4. Hyperlipidemia .10/2017 TC 368 TG 374 HDL 36 LDL 257 - had tried crestor 5mg  every other day a few years ago, did not tolerate due to muscle aches. . She reports side effects on lipitor in the past. She is unsure of her other statin histoyr.     SH: sister passed away [name], who was also a patient of mine.    Past Medical History:  Diagnosis Date  . Anxiety   . Depression   . Fibromyalgia   . GERD (gastroesophageal reflux disease)   . Hyperlipidemia   . Hypertension   . Palpitations   . 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 Codeine     REACTION: Adverse gastrointestinal effects  . Statins     Felt bad     Current Outpatient Medications  Medication Sig Dispense Refill  . acetaminophen (TYLENOL) 500 MG tablet Take 1,000 mg by mouth once as needed. For headache/pain    . calcium carbonate (OS-CAL) 600 MG TABS Take 600 mg by mouth daily.      . cefdinir (OMNICEF) 300 MG capsule Take 1  capsule (300 mg total) by mouth 2 (two) times daily. 14 capsule 0  . LORazepam (ATIVAN) 1 MG tablet TAKE 1 TABLET LATE MORNING, 1/2 TAB LATE AFTERNOON, AND 1 TAB EVERY EVENING. 75 tablet 0  . metoprolol succinate (TOPROL-XL) 25 MG 24 hr tablet TAKE 1 TABLET BY MOUTH  DAILY WITH OR IMMEDIATLEY  FOLLOWING A MEAL 90 tablet 0  . mirtazapine (REMERON) 15 MG tablet TAKE 1 TABLET BY MOUTH AT BEDTIME. 30 tablet 5  . ondansetron (ZOFRAN-ODT) 8 MG disintegrating tablet TAKE 1 TABLET BY MOUTH EVERY 8 HOURS AS NEEDED FOR NAUSEA OR VOMITING. 60 tablet 0  . pantoprazole (PROTONIX) 40 MG tablet TAKE 1 TABLET BY MOUTH TWO  TIMES DAILY 180 tablet 0  . PARoxetine (PAXIL) 20 MG tablet TAKE 1 TABLET BY MOUTH IN  THE MORNING AND 2 TABLETS  IN THE EVENING 270 tablet 0  . sucralfate (CARAFATE) 1 g tablet TAKE 1 TABLET BY MOUTH BEFORE MEALS AND AT BEDTIME. (Patient taking differently: As needed) 120 tablet 5  . sucralfate (CARAFATE) 1 g tablet TAKE 1 TABLET BY MOUTH BEFORE MEALS AND AT BEDTIME. 120 tablet 5  . warfarin (COUMADIN) 5 MG tablet TAKE 1/2 -1 TABLET BY MOUTH DAILY AS DIRECTED. 25 tablet 3   No current facility-administered medications for this visit.      Past  Surgical History:  Procedure Laterality Date  . COLONOSCOPY N/A 04/01/2015   Procedure: COLONOSCOPY;  Surgeon: Rogene Houston, MD;  [facility];  Service: Endoscopy;  Laterality: N/A;  1:45  . EMBOLECTOMY Left 12/19/2012   Procedure: EMBOLECTOMY OF LEFT BRACHIAL ARTERY;  Surgeon: Conrad [place], MD;  Location: [place];  Service: Vascular;  Laterality: Left;  . EYE SURGERY       Allergies  Allergen Reactions  . Codeine     REACTION: Adverse gastrointestinal effects  . Statins     Felt bad      Family History  Problem Relation Age of Onset  . Cancer Mother        ovarian, colon  . Heart disease Mother   . Heart attack Father   . Glaucoma Father   . COPD Sister   . Benign prostatic hyperplasia Brother      Social  History Ms. Davisson reports that she has never smoked. She has never used smokeless tobacco. Ms. Moulin reports that she does not drink alcohol.   Review of Systems CONSTITUTIONAL: No weight loss, fever, chills, weakness or fatigue.  HEENT: Eyes: No visual loss, blurred vision, double vision or yellow sclerae.No hearing loss, sneezing, congestion, runny nose or sore throat.  SKIN: No rash or itching.  CARDIOVASCULAR: per hpi RESPIRATORY: No shortness of breath, cough or sputum.  GASTROINTESTINAL: No anorexia, nausea, vomiting or diarrhea. No abdominal pain or blood.  GENITOURINARY: No burning on urination, no polyuria NEUROLOGICAL: No headache, dizziness, syncope, paralysis, ataxia, numbness or tingling in the extremities. No change in bowel or bladder control.  MUSCULOSKELETAL: No muscle, back pain, joint pain or stiffness.  LYMPHATICS: No enlarged nodes. No history of splenectomy.  PSYCHIATRIC: No history of depression or anxiety.  ENDOCRINOLOGIC: No reports of sweating, cold or heat intolerance. No polyuria or polydipsia.  [name]   Physical Examination Vitals:   01/09/18 1502  BP: 134/74  Pulse: 77  SpO2: 96%   Vitals:   01/09/18 1502  Weight: 165 lb (74.8 kg)  Height: 5\' 1"  (1.549 m)    Gen: resting comfortably, no acute distress HEENT: no scleral icterus, pupils equal round and reactive, no palptable cervical adenopathy,  CV: RRR, no m/r/g, no jvd Resp: Clear to auscultation bilaterally GI: abdomen is soft, non-tender, non-distended, normal bowel sounds, no hepatosplenomegaly MSK: extremities are warm, no edema.  Skin: warm, no rash Neuro:  no focal deficits Psych: appropriate affect   Diagnostic Studies 05/2013 21 day Event monitor No arrythmias  07/2015 echo Study Conclusions  - Left ventricle: The cavity size was normal. Wall thickness was  increased in a pattern of mild LVH. Systolic function was normal.  The estimated ejection fraction was in the range  of 60% to 65%.  Wall motion was normal; there were no regional wall motion  abnormalities. Doppler parameters are consistent with abnormal  left ventricular relaxation (grade 1 diastolic dysfunction). - Aortic valve: Mildly calcified annulus. Trileaflet; mildly  thickened leaflets. Valve area (VTI): 2.21 cm^2. Valve area  (Vmax): 2.35 cm^2. - Mitral valve: Mildly calcified annulus. Mildly thickened leaflets  . - Technically adequate study.  07/2015 LUE Arterial US IMPRESSION: Left upper extremity arterial noninvasive study demonstrates no evidence of significant arterial occlusive disease.  10/2015 Lexiscan MPI  There was no ST segment deviation noted during stress.  The study is normal. There are no perfusion defects consistent with prior infarct or current ischemia.  This is a low risk study.  The left  ventricular ejection fraction is normal (55-65%).    Assessment and Plan  1. Left brachial artery occlusion  - secondary to thromboembolism, unclear source of the embolism (cardiac vs aortic atheroscerlosis)  - she will continue coumadin.   2. Palpitations - negative event monitor 05/2013. Hx of anxiety, probable etiology - no recent symptoms, continue to monitor.    3. Chest pain -negative stress test previously. - denies any recent symptoms, continue to monitor.    4. Hyperlipidemia - has not tolerated crestor or lipitor in the past, the rest of her statin history is unclear - severely elevated LDL. We will try pravastatin 20mg , if tolerated titrate up. If not titrated consider zetia or pcsk9 inhibitor, at this time she is fairly against using in an injection however.          Arnoldo Lenis, M.D.

## 2018-01-22 ENCOUNTER — Encounter: Payer: Self-pay | Admitting: Family Medicine

## 2018-01-22 ENCOUNTER — Ambulatory Visit (INDEPENDENT_AMBULATORY_CARE_PROVIDER_SITE_OTHER): Payer: Medicare Other | Admitting: Family Medicine

## 2018-01-22 VITALS — BP 120/82 | Ht 61.0 in | Wt 165.8 lb

## 2018-01-22 DIAGNOSIS — R5383 Other fatigue: Secondary | ICD-10-CM

## 2018-01-22 DIAGNOSIS — M25511 Pain in right shoulder: Secondary | ICD-10-CM | POA: Diagnosis not present

## 2018-01-22 DIAGNOSIS — I70208 Unspecified atherosclerosis of native arteries of extremities, other extremity: Secondary | ICD-10-CM

## 2018-01-22 DIAGNOSIS — D72829 Elevated white blood cell count, unspecified: Secondary | ICD-10-CM | POA: Diagnosis not present

## 2018-01-22 DIAGNOSIS — M542 Cervicalgia: Secondary | ICD-10-CM

## 2018-01-22 MED ORDER — LORAZEPAM 1 MG PO TABS: ORAL_TABLET | ORAL | 4 refills | Status: DC

## 2018-01-22 NOTE — Progress Notes (Signed)
   Subjective:    Patient ID: Jacqueline Morgan, female    DOB: 02/01/1941, 77 y.o.   MRN: [id number]  Neck Pain   Pertinent negatives include no chest pain or headaches.  Patient arrives with right sided neck pain that goes down her shoulder and right arm. Pain has gotten worse over the last few weeks. Patient with neck pain discomfort.  Radiates into the shoulder region.  Radiates into the upper arm.  Wakes her up at night.  Aches a lot.  Hurts to move.  Denies anything that triggered this Currently using Tylenol for discomfort Patient denies any numbness tingling into the hand denies weakness. PMH benign   Review of Systems  Constitutional: Negative for activity change, appetite change and fatigue.  HENT: Negative for congestion and rhinorrhea.   Respiratory: Negative for cough and shortness of breath.   Cardiovascular: Negative for chest pain and leg swelling.  Gastrointestinal: Negative for abdominal pain and constipation.  Musculoskeletal: Positive for neck pain.  Skin: Negative for color change.  Neurological: Negative for light-headedness and headaches.  Psychiatric/Behavioral: Negative for behavioral problems and confusion.       Objective:   Physical Exam  Constitutional: She appears well-nourished. No distress.  HENT:  Head: Normocephalic and atraumatic.  Eyes: Right eye exhibits no discharge. Left eye exhibits no discharge.  Neck: No tracheal deviation present.  Cardiovascular: Normal rate, regular rhythm and normal heart sounds.  No murmur heard. Pulmonary/Chest: Effort normal and breath sounds normal. No respiratory distress.  Musculoskeletal: She exhibits no edema.  Lymphadenopathy:    She has no cervical adenopathy.  Neurological: She is alert. Coordination normal.  Skin: Skin is warm and dry.  Psychiatric: She has a normal mood and affect. Her behavior is normal.  Vitals reviewed.  There is some subjective tenderness along the right aspect of the neck and  shoulder region.  Reflexes are good.  Strength is good.  Range of motion subpar Does have some evidence of possible AC joint arthritis versus rotator cuff impingement       Assessment & Plan:  Neck pain shoulder pain X-rays indicated Tylenol for pain Refills of her Ativan given do not use more than 2/day Has chronic anxiety and tremor  Patient with severe fatigue tiredness feeling rundown all the time every other day she helps out with her 76 year old mother To some degree this wears her down We will check lab work to check her blood count to make sure she is not anemic May need more detailed testing such as ferritin or B12 depending on results

## 2018-01-23 ENCOUNTER — Encounter: Payer: Self-pay | Admitting: Family Medicine

## 2018-01-23 LAB — CBC WITH DIFFERENTIAL/PLATELET
BASOS: 1 %
Basophils Absolute: 0.1 10*3/uL (ref 0.0–0.2)
EOS (ABSOLUTE): 0.1 10*3/uL (ref 0.0–0.4)
Eos: 2 %
HEMATOCRIT: 40.6 % (ref 34.0–46.6)
Hemoglobin: 13.3 g/dL (ref 11.1–15.9)
Immature Grans (Abs): 0 10*3/uL (ref 0.0–0.1)
Immature Granulocytes: 0 %
LYMPHS ABS: 2.7 10*3/uL (ref 0.7–3.1)
Lymphs: 31 %
MCH: 27.6 pg (ref 26.6–33.0)
MCHC: 32.8 g/dL (ref 31.5–35.7)
MCV: 84 fL (ref 79–97)
MONOS ABS: 0.7 10*3/uL (ref 0.1–0.9)
Monocytes: 8 %
NEUTROS ABS: 5.1 10*3/uL (ref 1.4–7.0)
Neutrophils: 58 %
Platelets: 446 10*3/uL (ref 150–450)
RBC: 4.82 x10E6/uL (ref 3.77–5.28)
RDW: 14.7 % (ref 12.3–15.4)
WBC: 8.7 10*3/uL (ref 3.4–10.8)

## 2018-01-24 ENCOUNTER — Ambulatory Visit (INDEPENDENT_AMBULATORY_CARE_PROVIDER_SITE_OTHER): Payer: Medicare Other | Admitting: *Deleted

## 2018-01-24 ENCOUNTER — Other Ambulatory Visit: Payer: Self-pay | Admitting: Family Medicine

## 2018-01-24 DIAGNOSIS — Z5181 Encounter for therapeutic drug level monitoring: Secondary | ICD-10-CM

## 2018-01-24 DIAGNOSIS — I82621 Acute embolism and thrombosis of deep veins of right upper extremity: Secondary | ICD-10-CM | POA: Diagnosis not present

## 2018-01-24 DIAGNOSIS — R5383 Other fatigue: Secondary | ICD-10-CM

## 2018-01-24 LAB — POCT INR: INR: 2.6 (ref 2.0–3.0)

## 2018-01-24 NOTE — Telephone Encounter (Signed)
Please have patient do a vitamin D test-as requested

## 2018-01-24 NOTE — Patient Instructions (Signed)
Continue coumadin 1/2 tablet daily except 1 tablet on Mondays and Fridays Continue greens/salad Recheck in 4 weeks 

## 2018-01-26 ENCOUNTER — Ambulatory Visit (HOSPITAL_COMMUNITY)
Admission: RE | Admit: 2018-01-26 | Discharge: 2018-01-26 | Disposition: A | Discharge status: Home / Self Care | Payer: Medicare Other | Source: Ambulatory Visit | Attending: Family Medicine | Admitting: Family Medicine

## 2018-01-26 DIAGNOSIS — M47812 Spondylosis without myelopathy or radiculopathy, cervical region: Secondary | ICD-10-CM | POA: Insufficient documentation

## 2018-01-26 DIAGNOSIS — M542 Cervicalgia: Secondary | ICD-10-CM | POA: Insufficient documentation

## 2018-01-26 DIAGNOSIS — R5383 Other fatigue: Secondary | ICD-10-CM | POA: Diagnosis not present

## 2018-01-26 DIAGNOSIS — M25511 Pain in right shoulder: Secondary | ICD-10-CM | POA: Diagnosis not present

## 2018-01-26 DIAGNOSIS — M810 Age-related osteoporosis without current pathological fracture: Secondary | ICD-10-CM | POA: Diagnosis not present

## 2018-01-27 LAB — VITAMIN D 25 HYDROXY (VIT D DEFICIENCY, FRACTURES): VIT D 25 HYDROXY: 9 ng/mL — AB (ref 30.0–100.0)

## 2018-01-29 ENCOUNTER — Other Ambulatory Visit: Payer: Self-pay | Admitting: *Deleted

## 2018-01-29 MED ORDER — VITAMIN D (ERGOCALCIFEROL) 1.25 MG (50000 UNIT) PO CAPS: 50000.0000 [IU] | ORAL_CAPSULE | ORAL | 3 refills | Status: DC

## 2018-01-30 ENCOUNTER — Other Ambulatory Visit: Payer: Self-pay | Admitting: *Deleted

## 2018-01-30 MED ORDER — PREDNISONE 20 MG PO TABS: ORAL_TABLET | ORAL | 0 refills | Status: DC

## 2018-02-08 ENCOUNTER — Encounter: Payer: Self-pay | Admitting: Family Medicine

## 2018-02-08 NOTE — Telephone Encounter (Signed)
Please let the patient know that we will be happy to see her next week We may need to refer her to a specialist if we are unable to improve this issue we can discuss that more when she follows up

## 2018-02-13 ENCOUNTER — Ambulatory Visit: Payer: Medicare Other | Admitting: Family Medicine

## 2018-02-16 ENCOUNTER — Other Ambulatory Visit: Payer: Self-pay | Admitting: Family Medicine

## 2018-02-20 ENCOUNTER — Other Ambulatory Visit: Payer: Self-pay

## 2018-02-21 ENCOUNTER — Ambulatory Visit: Payer: Medicare Other | Admitting: Student

## 2018-02-21 ENCOUNTER — Ambulatory Visit (INDEPENDENT_AMBULATORY_CARE_PROVIDER_SITE_OTHER): Payer: Medicare Other | Admitting: *Deleted

## 2018-02-21 DIAGNOSIS — I742 Embolism and thrombosis of arteries of the upper extremities: Secondary | ICD-10-CM | POA: Diagnosis not present

## 2018-02-21 DIAGNOSIS — Z5181 Encounter for therapeutic drug level monitoring: Secondary | ICD-10-CM

## 2018-02-21 DIAGNOSIS — I82621 Acute embolism and thrombosis of deep veins of right upper extremity: Secondary | ICD-10-CM | POA: Diagnosis not present

## 2018-02-21 LAB — POCT INR: INR: 2.1 (ref 2.0–3.0)

## 2018-02-21 NOTE — Patient Instructions (Signed)
Continue coumadin 1/2 tablet daily except 1 tablet on Mondays and Fridays Continue greens/salad Recheck in 4 weeks

## 2018-02-27 DIAGNOSIS — M25511 Pain in right shoulder: Secondary | ICD-10-CM | POA: Diagnosis not present

## 2018-03-12 ENCOUNTER — Other Ambulatory Visit: Payer: Self-pay | Admitting: Family Medicine

## 2018-03-21 ENCOUNTER — Ambulatory Visit (INDEPENDENT_AMBULATORY_CARE_PROVIDER_SITE_OTHER): Payer: Medicare Other | Admitting: *Deleted

## 2018-03-21 DIAGNOSIS — Z5181 Encounter for therapeutic drug level monitoring: Secondary | ICD-10-CM

## 2018-03-21 DIAGNOSIS — I82621 Acute embolism and thrombosis of deep veins of right upper extremity: Secondary | ICD-10-CM

## 2018-03-21 LAB — POCT INR: INR: 1.5 — AB (ref 2.0–3.0)

## 2018-03-21 NOTE — Patient Instructions (Signed)
Take coumadin 1 1/2 tablets tonight then increase dose to 1/2 tablet daily except 1 tablet on Mondays, Wednesdays and Fridays Continue greens/salad Recheck in 3 weeks

## 2018-03-26 DIAGNOSIS — Z1231 Encounter for screening mammogram for malignant neoplasm of breast: Secondary | ICD-10-CM | POA: Diagnosis not present

## 2018-04-03 ENCOUNTER — Encounter (INDEPENDENT_AMBULATORY_CARE_PROVIDER_SITE_OTHER): Payer: Self-pay | Admitting: *Deleted

## 2018-04-16 ENCOUNTER — Ambulatory Visit (INDEPENDENT_AMBULATORY_CARE_PROVIDER_SITE_OTHER): Payer: Medicare Other | Admitting: Pharmacist

## 2018-04-16 DIAGNOSIS — Z5181 Encounter for therapeutic drug level monitoring: Secondary | ICD-10-CM

## 2018-04-16 DIAGNOSIS — I82621 Acute embolism and thrombosis of deep veins of right upper extremity: Secondary | ICD-10-CM | POA: Diagnosis not present

## 2018-04-16 LAB — POCT INR: INR: 2.5 (ref 2.0–3.0)

## 2018-04-16 NOTE — Patient Instructions (Signed)
Description   Continue 1/2 tablet daily except 1 tablet on Mondays, Wednesdays and Fridays Continue greens/salad Recheck in 4 weeks

## 2018-05-14 ENCOUNTER — Ambulatory Visit (INDEPENDENT_AMBULATORY_CARE_PROVIDER_SITE_OTHER): Payer: Medicare Other | Admitting: Pharmacist

## 2018-05-14 DIAGNOSIS — I82621 Acute embolism and thrombosis of deep veins of right upper extremity: Secondary | ICD-10-CM | POA: Diagnosis not present

## 2018-05-14 DIAGNOSIS — Z5181 Encounter for therapeutic drug level monitoring: Secondary | ICD-10-CM | POA: Diagnosis not present

## 2018-05-14 LAB — POCT INR: INR: 3.1 — AB (ref 2.0–3.0)

## 2018-05-14 NOTE — Patient Instructions (Signed)
Description   Take 1/2 tablet today, then continue 1/2 tablet daily except 1 tablet on Mondays, Wednesdays and Fridays Continue greens/salad Recheck in 4 weeks

## 2018-05-22 ENCOUNTER — Other Ambulatory Visit: Payer: Self-pay | Admitting: Cardiology

## 2018-06-06 ENCOUNTER — Ambulatory Visit (INDEPENDENT_AMBULATORY_CARE_PROVIDER_SITE_OTHER): Payer: Medicare Other | Admitting: *Deleted

## 2018-06-06 ENCOUNTER — Other Ambulatory Visit: Payer: Self-pay | Admitting: Family Medicine

## 2018-06-06 DIAGNOSIS — I82621 Acute embolism and thrombosis of deep veins of right upper extremity: Secondary | ICD-10-CM

## 2018-06-06 DIAGNOSIS — I742 Embolism and thrombosis of arteries of the upper extremities: Secondary | ICD-10-CM | POA: Diagnosis not present

## 2018-06-06 DIAGNOSIS — Z5181 Encounter for therapeutic drug level monitoring: Secondary | ICD-10-CM | POA: Diagnosis not present

## 2018-06-06 LAB — POCT INR: INR: 2.6 (ref 2.0–3.0)

## 2018-06-06 NOTE — Patient Instructions (Signed)
Continue coumadin 1/2 tablet daily except 1 tablet on Mondays, Wednesdays and Fridays Continue greens/salad Recheck in 5 weeks

## 2018-06-07 ENCOUNTER — Telehealth (INDEPENDENT_AMBULATORY_CARE_PROVIDER_SITE_OTHER): Payer: Self-pay | Admitting: *Deleted

## 2018-06-07 ENCOUNTER — Encounter (INDEPENDENT_AMBULATORY_CARE_PROVIDER_SITE_OTHER): Payer: Self-pay | Admitting: *Deleted

## 2018-06-07 ENCOUNTER — Ambulatory Visit (INDEPENDENT_AMBULATORY_CARE_PROVIDER_SITE_OTHER): Payer: Medicare Other | Admitting: Internal Medicine

## 2018-06-07 ENCOUNTER — Encounter (INDEPENDENT_AMBULATORY_CARE_PROVIDER_SITE_OTHER): Payer: Self-pay | Admitting: Internal Medicine

## 2018-06-07 VITALS — BP 140/61 | HR 85 | Temp 98.2°F | Ht 60.0 in | Wt 166.3 lb

## 2018-06-07 DIAGNOSIS — Z86010 Personal history of colonic polyps: Secondary | ICD-10-CM | POA: Insufficient documentation

## 2018-06-07 DIAGNOSIS — K219 Gastro-esophageal reflux disease without esophagitis: Secondary | ICD-10-CM | POA: Diagnosis not present

## 2018-06-07 DIAGNOSIS — I742 Embolism and thrombosis of arteries of the upper extremities: Secondary | ICD-10-CM | POA: Diagnosis not present

## 2018-06-07 MED ORDER — PEG-KCL-NACL-NASULF-NA ASC-C 140 G PO SOLR: 1.0000 | Freq: Once | ORAL | 0 refills | Status: AC

## 2018-06-07 NOTE — Telephone Encounter (Signed)
Patient needs plenvu 

## 2018-06-07 NOTE — Progress Notes (Signed)
Subjective:    Patient ID: Jacqueline Morgan, female    DOB: July 17, 1940, 78 y.o.   MRN: [id number]  HPIHx of ,multiple colon polyps and is due for a surveillance colonoscopy. She tells me she had a flare of GERD. She says last week she had diarrhea. After that she has had a lot of GERD. She says after she is up for a while she has soreness in her chest. She says she stays nauseated "all the time".  She says the Protonix helps her GERD. She takes the Carafate as needed. Appetite is okay . No weight loss. Has a BM x 1 a week.  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      04/01/2015 Colonoscopy with snare polypectomy: (Heme positive stool)  Impression:  Examination performed to cecum. Thirteen small polyps were cold snared and submitted together. Locations as above. Two polyps that were hot snared were submitted together. Single clip applied to polypectomy site at ascending colon. Multiple sigmoid colon diverticula. Small external hemorrhoids.  Patient had multiple small polyps removed. 2 polyps are sessile serrated polyps and the rest tubular adenomas. Results reviewed with patient. Next colonoscopy in 3 years.   Review of Systems Past Medical History:  Diagnosis Date  . Anxiety   . Depression   . Fibromyalgia   . GERD (gastroesophageal reflux disease)   . Hyperlipidemia   . Hypertension   . Palpitations   . 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 COLONOSCOPY N/A 04/01/2015   Procedure: COLONOSCOPY;  Surgeon: Rogene [place], MD;  [facility];  Service: Endoscopy;  Laterality: N/A;  1:45  . EMBOLECTOMY Left 12/19/2012   Procedure: EMBOLECTOMY OF LEFT BRACHIAL ARTERY;  Surgeon: Conrad [place], MD;  Location: [place];  Service: Vascular;  Laterality: Left;  . EYE SURGERY      Allergies  Allergen Reactions  . Codeine     REACTION: Adverse gastrointestinal effects  . Statins    Felt bad    Current Outpatient Medications on File Prior to Visit  Medication Sig Dispense Refill  . acetaminophen (TYLENOL) 500 MG tablet Take 1,000 mg by mouth once as needed. For headache/pain    . LORazepam (ATIVAN) 1 MG tablet 1/2 to 1 tablet bid prn 60 tablet 4  . metoprolol succinate (TOPROL-XL) 25 MG 24 hr tablet TAKE 1 TABLET BY MOUTH  DAILY WITH OR IMMEDIATLEY  FOLLOWING A MEAL 90 tablet 0  . mirtazapine (REMERON) 15 MG tablet TAKE 1 TABLET BY MOUTH AT BEDTIME. 30 tablet 0  . pantoprazole (PROTONIX) 40 MG tablet TAKE 1 TABLET BY MOUTH TWO  TIMES DAILY 180 tablet 0  . PARoxetine (PAXIL) 20 MG tablet TAKE 1 TABLET BY MOUTH IN  THE MORNING AND 2 TABLETS  IN THE EVENING 270 tablet 0  . sucralfate (CARAFATE) 1 g tablet TAKE 1 TABLET BY MOUTH BEFORE MEALS AND AT BEDTIME. (Patient taking differently: Take 1 g by mouth as needed. ) 120 tablet 5  . Vitamin D, Ergocalciferol, (DRISDOL) 50000 units CAPS capsule Take 1 capsule (50,000 Units total) by mouth every 7 (seven) days. 4 capsule 3  . warfarin (COUMADIN) 5 MG tablet TAKE 1/2 -1 TABLET BY MOUTH DAILY AS DIRECTED. 30 tablet 0  . pravastatin (PRAVACHOL) 20 MG tablet Take 1 tablet (20 mg total) by mouth every evening. 90 tablet 3   No current facility-administered medications on file prior to visit.  Objective:   Physical Exam Blood pressure 140/61, pulse 85, temperature 98.2 F (36.8 C), height 5' (1.524 m), weight 166 lb 4.8 oz (75.4 kg). Annye Rusk and oriented. Skin warm and dry. Oral mucosa is moist.   . Sclera anicteric, conjunctivae is pink. Thyroid not enlarged. No cervical lymphadenopathy. Lungs clear. Heart regular rate and rhythm. Chest: tenderness slight touch.  Abdomen is soft. Bowel sounds are positive. No hepatomegaly. No abdominal masses felt. No tenderness.  No edema to lower extremities.          Assessment & Plan:  GERD. Continue the Protonix. Will schedule an EGD. Colon polyps.: Due for surveillance.  The  risks of bleeding, perforation and infection were reviewed with patient.

## 2018-06-07 NOTE — Telephone Encounter (Signed)
Patient aware.

## 2018-06-07 NOTE — Telephone Encounter (Signed)
Patient is scheduled for colonoscopy and endoscopy 07/26/18 -- she needs to stop Coumadin 5 days prior -- please advise

## 2018-06-07 NOTE — Telephone Encounter (Signed)
OK to hold coumadin 5 days before procedure and resume coumadin night of procedure if OK with Dr Laural Golden. Thanks' Lisa

## 2018-06-07 NOTE — Telephone Encounter (Signed)
Forwarded to Dr.Rehman for review. 

## 2018-06-08 ENCOUNTER — Other Ambulatory Visit: Payer: Self-pay | Admitting: Family Medicine

## 2018-06-09 NOTE — Telephone Encounter (Signed)
May have 90-day prescription needs office visit this spring

## 2018-06-11 ENCOUNTER — Ambulatory Visit (INDEPENDENT_AMBULATORY_CARE_PROVIDER_SITE_OTHER): Payer: Medicare Other | Admitting: Cardiology

## 2018-06-11 ENCOUNTER — Encounter: Payer: Self-pay | Admitting: Cardiology

## 2018-06-11 VITALS — BP 130/74 | HR 85 | Ht 60.0 in | Wt 167.2 lb

## 2018-06-11 DIAGNOSIS — H40053 Ocular hypertension, bilateral: Secondary | ICD-10-CM | POA: Diagnosis not present

## 2018-06-11 DIAGNOSIS — E782 Mixed hyperlipidemia: Secondary | ICD-10-CM | POA: Diagnosis not present

## 2018-06-11 DIAGNOSIS — I10 Essential (primary) hypertension: Secondary | ICD-10-CM

## 2018-06-11 DIAGNOSIS — H02831 Dermatochalasis of right upper eyelid: Secondary | ICD-10-CM | POA: Diagnosis not present

## 2018-06-11 DIAGNOSIS — I742 Embolism and thrombosis of arteries of the upper extremities: Secondary | ICD-10-CM | POA: Diagnosis not present

## 2018-06-11 DIAGNOSIS — H02834 Dermatochalasis of left upper eyelid: Secondary | ICD-10-CM | POA: Diagnosis not present

## 2018-06-11 DIAGNOSIS — Z961 Presence of intraocular lens: Secondary | ICD-10-CM | POA: Diagnosis not present

## 2018-06-11 MED ORDER — EZETIMIBE 10 MG PO TABS: 10.0000 mg | ORAL_TABLET | Freq: Every day | ORAL | 3 refills | Status: DC

## 2018-06-11 NOTE — Telephone Encounter (Signed)
May have 1 refill each on these, 90 days is fine, needs office visit

## 2018-06-11 NOTE — Progress Notes (Signed)
Clinical Summary Ms. Limb is a 78 y.o.female seen today for follow up of the following medical problems.   1. 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  - underwent left arm thromboembolectomy 12/19/12 by Dr Adele Barthel  - echo was negative for thrombus. There was some question of possible aortic arch atherosclerosis. Discharged on coumadin. - denies any bleeding issues on coumadin. Notes some occsaional pain in left arm with activity that is unchanged..  - has not been in favor of NOACs   - no bleeding coumadin.    2. Hyperlipidemia 10/2017 TC 368 TG 374 HDL 36 LDL 257 - had tried crestor 5mg  every other day a few years ago, did not tolerate due to muscle aches. . She reports side effects on lipitor in the past. She is unsure of her other statin histoyr.   - last visit started pravastin 20mg  daily. She reports palpitations on pravastatin and she stopped taking.     SH: sister passed awayNormal Farrel Conners, who was also a patient of mine.     Past Medical History:  Diagnosis Date  . Anxiety   . Depression   . Fibromyalgia   . GERD (gastroesophageal reflux disease)   . Hyperlipidemia   . Hypertension   . Palpitations   . 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 Codeine     REACTION: Adverse gastrointestinal effects  . Statins     Felt bad     Current Outpatient Medications  Medication Sig Dispense Refill  . acetaminophen (TYLENOL) 500 MG tablet Take 1,000 mg by mouth once as needed. For headache/pain    . LORazepam (ATIVAN) 1 MG tablet 1/2 to 1 tablet bid prn 60 tablet 4  . metoprolol succinate (TOPROL-XL) 25 MG 24 hr tablet TAKE 1 TABLET BY MOUTH  DAILY WITH OR IMMEDIATLEY  FOLLOWING A MEAL 90 tablet 0  . mirtazapine (REMERON) 15 MG tablet TAKE 1 TABLET BY MOUTH AT BEDTIME. 30 tablet 0  . pantoprazole (PROTONIX) 40 MG tablet TAKE 1 TABLET BY  MOUTH TWO  TIMES DAILY 180 tablet 0  . PARoxetine (PAXIL) 20 MG tablet TAKE 1 TABLET BY MOUTH IN  THE MORNING AND 2 TABLETS  IN THE EVENING 270 tablet 0  . pravastatin (PRAVACHOL) 20 MG tablet Take 1 tablet (20 mg total) by mouth every evening. 90 tablet 3  . sucralfate (CARAFATE) 1 g tablet TAKE 1 TABLET BY MOUTH BEFORE MEALS AND AT BEDTIME. (Patient taking differently: Take 1 g by mouth as needed. ) 120 tablet 5  . Vitamin D, Ergocalciferol, (DRISDOL) 50000 units CAPS capsule Take 1 capsule (50,000 Units total) by mouth every 7 (seven) days. 4 capsule 3  . warfarin (COUMADIN) 5 MG tablet TAKE 1/2 -1 TABLET BY MOUTH DAILY AS DIRECTED. 30 tablet 0   No current facility-administered medications for this visit.      Past Surgical History:  Procedure Laterality Date  . COLONOSCOPY N/A 04/01/2015   Procedure: COLONOSCOPY;  Surgeon: Rogene [place], MD;  [facility];  Service: Endoscopy;  Laterality: N/A;  1:45  . EMBOLECTOMY Left 12/19/2012   Procedure: EMBOLECTOMY OF LEFT BRACHIAL ARTERY;  Surgeon: Conrad [place], MD;  Location: [place];  Service: Vascular;  Laterality: Left;  . EYE SURGERY       Allergies  Allergen Reactions  . Codeine     REACTION:  Adverse gastrointestinal effects  . Statins     Felt bad      Family History  Problem Relation Age of Onset  . Cancer Mother        ovarian, colon  . Heart disease Mother   . Heart attack Father   . Glaucoma Father   . COPD Sister   . Benign prostatic hyperplasia Brother      Social History Ms. Romano reports that she has never smoked. She has never used smokeless tobacco. Ms. Donley reports no history of alcohol use.   Review of Systems CONSTITUTIONAL: No weight loss, fever, chills, weakness or fatigue.  HEENT: Eyes: No visual loss, blurred vision, double vision or yellow sclerae.No hearing loss, sneezing, congestion, runny nose or sore throat.  SKIN: No rash or itching.  CARDIOVASCULAR: per hpi RESPIRATORY:  No shortness of breath, cough or sputum.  GASTROINTESTINAL: No anorexia, nausea, vomiting or diarrhea. No abdominal pain or blood.  GENITOURINARY: No burning on urination, no polyuria NEUROLOGICAL: No headache, dizziness, syncope, paralysis, ataxia, numbness or tingling in the extremities. No change in bowel or bladder control.  MUSCULOSKELETAL: No muscle, back pain, joint pain or stiffness.  LYMPHATICS: No enlarged nodes. No history of splenectomy.  PSYCHIATRIC: No history of depression or anxiety.  ENDOCRINOLOGIC: No reports of sweating, cold or heat intolerance. No polyuria or polydipsia.  [name]   Physical Examination Today's Vitals   06/11/18 0952  BP: 130/74  Pulse: 85  SpO2: 94%  Weight: 167 lb 3.2 oz (75.8 kg)  Height: 5' (1.524 m)   Body mass index is 32.65 kg/m.  Gen: resting comfortably, no acute distress HEENT: no scleral icterus, pupils equal round and reactive, no palptable cervical adenopathy,  CV: RRR, no mrg, no jvd Resp: Clear to auscultation bilaterally GI: abdomen is soft, non-tender, non-distended, normal bowel sounds, no hepatosplenomegaly MSK: extremities are warm, no edema.  Skin: warm, no rash Neuro:  no focal deficits Psych: appropriate affect   Diagnostic Studies 05/2013 21 day Event monitor No arrythmias  07/2015 echo Study Conclusions  - Left ventricle: The cavity size was normal. Wall thickness was  increased in a pattern of mild LVH. Systolic function was normal.  The estimated ejection fraction was in the range of 60% to 65%.  Wall motion was normal; there were no regional wall motion  abnormalities. Doppler parameters are consistent with abnormal  left ventricular relaxation (grade 1 diastolic dysfunction). - Aortic valve: Mildly calcified annulus. Trileaflet; mildly  thickened leaflets. Valve area (VTI): 2.21 cm^2. Valve area  (Vmax): 2.35 cm^2. - Mitral valve: Mildly calcified annulus. Mildly thickened leaflets  . - Technically  adequate study.  07/2015 LUE Arterial US IMPRESSION: Left upper extremity arterial noninvasive study demonstrates no evidence of significant arterial occlusive disease.  10/2015 Lexiscan MPI  There was no ST segment deviation noted during stress.  The study is normal. There are no perfusion defects consistent with prior infarct or current ischemia.  This is a low risk study.  The left ventricular ejection fraction is normal (55-65%).    Assessment and Plan  1. Left brachial artery occlusion  - secondary to thromboembolism, unclear source of the embolism (cardiac vs aortic atheroscerlosis)  - no recent issues, continue coumadin.    2. Hyperlipidemia - has not tolerated crestor or lipitor in the past,recently side effects on pravastatin - start zetia 10mg  daily. She remains resistant to repatha  EKG today shows NSR    F/u 6 months   Arnoldo Lenis, M.D.

## 2018-06-11 NOTE — Patient Instructions (Signed)
Medication Instructions:  START Zetia 10 mg daily for your cholesterol   Labwork: None today  Procedures/Testing: None today  Follow-Up: 6 months with Dr.Branch  Any Additional Special Instructions Will Be Listed Below (If Applicable).     If you need a refill on your cardiac medications before your next appointment, please call your pharmacy.

## 2018-07-05 ENCOUNTER — Encounter (INDEPENDENT_AMBULATORY_CARE_PROVIDER_SITE_OTHER): Payer: Self-pay | Admitting: *Deleted

## 2018-07-06 ENCOUNTER — Other Ambulatory Visit: Payer: Self-pay | Admitting: Cardiology

## 2018-07-13 ENCOUNTER — Telehealth: Payer: Self-pay | Admitting: *Deleted

## 2018-07-13 NOTE — Telephone Encounter (Signed)
ERROR

## 2018-07-13 NOTE — Telephone Encounter (Signed)

## 2018-07-18 ENCOUNTER — Other Ambulatory Visit: Payer: Self-pay

## 2018-07-18 ENCOUNTER — Ambulatory Visit (INDEPENDENT_AMBULATORY_CARE_PROVIDER_SITE_OTHER): Payer: Medicare Other | Admitting: *Deleted

## 2018-07-18 DIAGNOSIS — Z5181 Encounter for therapeutic drug level monitoring: Secondary | ICD-10-CM | POA: Diagnosis not present

## 2018-07-18 DIAGNOSIS — I82621 Acute embolism and thrombosis of deep veins of right upper extremity: Secondary | ICD-10-CM | POA: Diagnosis not present

## 2018-07-18 LAB — POCT INR: INR: 2.6 (ref 2.0–3.0)

## 2018-07-18 NOTE — Patient Instructions (Signed)
Continue coumadin 1/2 tablet daily except 1 tablet on Mondays, Wednesdays and Fridays Continue greens/salad Recheck in 6 weeks

## 2018-07-30 ENCOUNTER — Other Ambulatory Visit: Payer: Self-pay | Admitting: Family Medicine

## 2018-07-31 NOTE — Telephone Encounter (Signed)
May have 1 month refill Schedule virtual visit

## 2018-08-29 ENCOUNTER — Ambulatory Visit (INDEPENDENT_AMBULATORY_CARE_PROVIDER_SITE_OTHER): Payer: Medicare Other | Admitting: *Deleted

## 2018-08-29 DIAGNOSIS — Z5181 Encounter for therapeutic drug level monitoring: Secondary | ICD-10-CM | POA: Diagnosis not present

## 2018-08-29 DIAGNOSIS — I82621 Acute embolism and thrombosis of deep veins of right upper extremity: Secondary | ICD-10-CM | POA: Diagnosis not present

## 2018-08-29 LAB — POCT INR: INR: 2.6 (ref 2.0–3.0)

## 2018-08-29 NOTE — Patient Instructions (Signed)
Continue coumadin 1/2 tablet daily except 1 tablet on Mondays, Wednesdays and Fridays Continue greens/salad Pending colonoscopy by Dr Laural Golden 6/17  Will hold coumadin 4 day before procedure and resume night of procedure if ok with MD Recheck INR 1 week after procedure

## 2018-09-10 ENCOUNTER — Telehealth (INDEPENDENT_AMBULATORY_CARE_PROVIDER_SITE_OTHER): Payer: Self-pay | Admitting: *Deleted

## 2018-09-10 NOTE — Telephone Encounter (Signed)
noted 

## 2018-09-10 NOTE — Telephone Encounter (Signed)
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

## 2018-09-19 ENCOUNTER — Encounter (HOSPITAL_COMMUNITY): Admission: RE | Payer: Self-pay | Source: Home / Self Care

## 2018-09-19 ENCOUNTER — Ambulatory Visit (HOSPITAL_COMMUNITY): Admission: RE | Admit: 2018-09-19 | Payer: Medicare Other | Source: Home / Self Care | Admitting: Internal Medicine

## 2018-09-19 DIAGNOSIS — Z86010 Personal history of colonic polyps: Principal | ICD-10-CM

## 2018-09-19 DIAGNOSIS — K219 Gastro-esophageal reflux disease without esophagitis: Secondary | ICD-10-CM

## 2018-09-19 SURGERY — COLONOSCOPY
Anesthesia: Moderate Sedation

## 2018-09-26 ENCOUNTER — Ambulatory Visit (INDEPENDENT_AMBULATORY_CARE_PROVIDER_SITE_OTHER): Payer: Medicare Other | Admitting: *Deleted

## 2018-09-26 DIAGNOSIS — Z5181 Encounter for therapeutic drug level monitoring: Secondary | ICD-10-CM | POA: Diagnosis not present

## 2018-09-26 DIAGNOSIS — I742 Embolism and thrombosis of arteries of the upper extremities: Secondary | ICD-10-CM

## 2018-09-26 DIAGNOSIS — I82621 Acute embolism and thrombosis of deep veins of right upper extremity: Secondary | ICD-10-CM | POA: Diagnosis not present

## 2018-09-26 LAB — POCT INR: INR: 3 (ref 2.0–3.0)

## 2018-09-26 NOTE — Patient Instructions (Signed)
Continue coumadin 1/2 tablet daily except 1 tablet on Mondays, Wednesdays and Fridays Continue greens/salad Colonoscopy was cancelled per pt due to Covid 19

## 2018-09-28 ENCOUNTER — Other Ambulatory Visit: Payer: Self-pay | Admitting: Family Medicine

## 2018-10-24 ENCOUNTER — Other Ambulatory Visit: Payer: Self-pay | Admitting: Family Medicine

## 2018-10-25 NOTE — Telephone Encounter (Signed)
Please schedule for virtual visit or in person-patient's choice-somewhere within the next 6 weeks may have 1 refill each

## 2018-10-26 NOTE — Telephone Encounter (Signed)
Tried to contact patient to have her set up appt; no answer

## 2018-10-30 ENCOUNTER — Other Ambulatory Visit: Payer: Self-pay | Admitting: Family Medicine

## 2018-10-31 NOTE — Telephone Encounter (Signed)
Will need follow-up office visit in the next 30 days may have 1 refill follow-up visit can be virtual

## 2018-11-01 NOTE — Telephone Encounter (Signed)
Please call and schedule a virtual visit then send back to nurses to send in med

## 2018-11-01 NOTE — Telephone Encounter (Signed)
Pt is scheduled for 8/20 virtual visit

## 2018-11-07 ENCOUNTER — Other Ambulatory Visit: Payer: Self-pay

## 2018-11-07 ENCOUNTER — Ambulatory Visit (INDEPENDENT_AMBULATORY_CARE_PROVIDER_SITE_OTHER): Payer: Medicare Other | Admitting: *Deleted

## 2018-11-07 DIAGNOSIS — I82621 Acute embolism and thrombosis of deep veins of right upper extremity: Secondary | ICD-10-CM

## 2018-11-07 DIAGNOSIS — Z5181 Encounter for therapeutic drug level monitoring: Secondary | ICD-10-CM

## 2018-11-07 DIAGNOSIS — I82622 Acute embolism and thrombosis of deep veins of left upper extremity: Secondary | ICD-10-CM | POA: Diagnosis not present

## 2018-11-07 LAB — POCT INR: INR: 3.2 — AB (ref 2.0–3.0)

## 2018-11-07 NOTE — Patient Instructions (Signed)
Take coumadin 1/2 tablet tonight then resume 1/2 tablet daily except 1 tablet on Mondays, Wednesdays and Fridays Continue greens/salad Colonoscopy was cancelled per pt due to Covid 19

## 2018-11-12 ENCOUNTER — Other Ambulatory Visit: Payer: Self-pay | Admitting: Cardiology

## 2018-11-22 ENCOUNTER — Other Ambulatory Visit: Payer: Self-pay

## 2018-11-22 ENCOUNTER — Ambulatory Visit (INDEPENDENT_AMBULATORY_CARE_PROVIDER_SITE_OTHER): Payer: Medicare Other | Admitting: Family Medicine

## 2018-11-22 DIAGNOSIS — E7849 Other hyperlipidemia: Secondary | ICD-10-CM

## 2018-11-22 DIAGNOSIS — I10 Essential (primary) hypertension: Secondary | ICD-10-CM | POA: Diagnosis not present

## 2018-11-22 DIAGNOSIS — R5383 Other fatigue: Secondary | ICD-10-CM

## 2018-11-22 DIAGNOSIS — F411 Generalized anxiety disorder: Secondary | ICD-10-CM | POA: Diagnosis not present

## 2018-11-22 MED ORDER — MIRTAZAPINE 7.5 MG PO TABS: 7.5000 mg | ORAL_TABLET | Freq: Every day | ORAL | 2 refills | Status: DC

## 2018-11-22 MED ORDER — FAMOTIDINE 20 MG PO TABS: 20.0000 mg | ORAL_TABLET | Freq: Two times a day (BID) | ORAL | 5 refills | Status: DC

## 2018-11-22 MED ORDER — METOPROLOL SUCCINATE ER 25 MG PO TB24: ORAL_TABLET | ORAL | 5 refills | Status: DC

## 2018-11-22 MED ORDER — LORAZEPAM 1 MG PO TABS: ORAL_TABLET | ORAL | 5 refills | Status: DC

## 2018-11-22 MED ORDER — PAROXETINE HCL 20 MG PO TABS: ORAL_TABLET | ORAL | 5 refills | Status: DC

## 2018-11-22 NOTE — Progress Notes (Signed)
   Subjective:    Patient ID: Jacqueline Morgan, female    DOB: Aug 15, 1940, 78 y.o.   MRN: [id number]  Hyperlipidemia This is a chronic problem. The current episode started more than 1 year ago. Pertinent negatives include no chest pain or shortness of breath. Treatments tried: not taking meds. Risk factors for coronary artery disease include dyslipidemia, post-menopausal and a sedentary lifestyle.  not a great diet  Patient reports continued fatigue.  Review of Systems  Constitutional: Negative for activity change and appetite change.  HENT: Negative for congestion and rhinorrhea.   Respiratory: Negative for cough and shortness of breath.   Cardiovascular: Negative for chest pain and leg swelling.  Gastrointestinal: Negative for abdominal pain, nausea and vomiting.  Skin: Negative for color change.  Neurological: Negative for dizziness and weakness.  Psychiatric/Behavioral: Negative for agitation and confusion.   Virtual Visit via Video Note  I connected with Jacqueline Morgan on 11/22/18 at  1:10 PM EDT by a video enabled telemedicine application and verified that I am speaking with the correct person using two identifiers.  Location: Patient: home Provider: office   I discussed the limitations of evaluation and management by telemedicine and the availability of in person appointments. The patient expressed understanding and agreed to proceed.  History of Present Illness:    Observations/Objective:   Assessment and Plan:   Follow Up Instructions:    I discussed the assessment and treatment plan with the patient. The patient was provided an opportunity to ask questions and all were answered. The patient agreed with the plan and demonstrated an understanding of the instructions.   The patient was advised to call back or seek an in-person evaluation if the symptoms worsen or if the condition fails to improve as anticipated.  I provided 17 minutes of non-face-to-face time  during this encounter.  During today's visit we had good discussion about the below problems that are documented      Objective:   Physical Exam   Today's visit was via telephone Physical exam was not possible for this visit      Assessment & Plan:  1. Other fatigue Significant fatigue and tiredness feeling rundown low energy.  States she does not exercise much but she just gives out she is on Coumadin which raises the question of possible anemia she is also has history of some variation in thyroid so we will check lab work on that as well It is also possible that the mirtazapine is causing some daytime fatigue I recommend reducing the dose of 7.5 when we do a follow-up with her we may possibly reduce it down to none she uses lorazepam at nighttime 2. Essential hypertension As best she knows her blood pressure under good control she takes metoprolol to keep the heart rate from running fast we will continue the medication  3. Other hyperlipidemia We will do screening lab on her  4. GAD (generalized anxiety disorder) Continue the Paxil continue lorazepam we will do a follow-up visit with her in approximately 3 to 4 weeks to see how the changes go with her medications  GERD-she is concerned about PPI and kidney function I recommend switching over to Pepcid twice daily

## 2018-12-06 ENCOUNTER — Telehealth: Payer: Self-pay | Admitting: Family Medicine

## 2018-12-06 ENCOUNTER — Telehealth: Payer: Self-pay | Admitting: *Deleted

## 2018-12-06 DIAGNOSIS — E559 Vitamin D deficiency, unspecified: Secondary | ICD-10-CM

## 2018-12-06 DIAGNOSIS — E7849 Other hyperlipidemia: Secondary | ICD-10-CM

## 2018-12-06 DIAGNOSIS — Z79899 Other long term (current) drug therapy: Secondary | ICD-10-CM

## 2018-12-06 NOTE — Telephone Encounter (Signed)
Lab orders placed and pt is aware 

## 2018-12-06 NOTE — Telephone Encounter (Signed)
Last labs completed on 10/23/17 CMP, lipid and TSH. Please advise. Thank you.

## 2018-12-06 NOTE — Telephone Encounter (Signed)
Pt is at lab now and they do not have her orders for bw. States she was told to do bw at her virtual visit on 11/22/18. Last labs July 2019 lipid, tsh, cmp

## 2018-12-06 NOTE — Telephone Encounter (Signed)
Lipid, met 7, vitamin D-hyperlipidemia vitamin D deficiency high risk med

## 2018-12-06 NOTE — Telephone Encounter (Signed)
See other message

## 2018-12-06 NOTE — Telephone Encounter (Signed)
Patient is requesting labs for her follow up visit.

## 2018-12-07 ENCOUNTER — Ambulatory Visit: Payer: Medicare Other | Admitting: Family Medicine

## 2018-12-12 DIAGNOSIS — E7849 Other hyperlipidemia: Secondary | ICD-10-CM | POA: Diagnosis not present

## 2018-12-12 DIAGNOSIS — E559 Vitamin D deficiency, unspecified: Secondary | ICD-10-CM | POA: Diagnosis not present

## 2018-12-12 DIAGNOSIS — Z79899 Other long term (current) drug therapy: Secondary | ICD-10-CM | POA: Diagnosis not present

## 2018-12-13 ENCOUNTER — Other Ambulatory Visit: Payer: Self-pay | Admitting: *Deleted

## 2018-12-13 DIAGNOSIS — R7989 Other specified abnormal findings of blood chemistry: Secondary | ICD-10-CM

## 2018-12-13 LAB — BASIC METABOLIC PANEL
BUN/Creatinine Ratio: 12 (ref 12–28)
BUN: 13 mg/dL (ref 8–27)
CO2: 22 mmol/L (ref 20–29)
Calcium: 9.3 mg/dL (ref 8.7–10.3)
Chloride: 99 mmol/L (ref 96–106)
Creatinine, Ser: 1.08 mg/dL — ABNORMAL HIGH (ref 0.57–1.00)
GFR calc Af Amer: 57 mL/min/{1.73_m2} — ABNORMAL LOW (ref >59)
GFR calc non Af Amer: 50 mL/min/{1.73_m2} — ABNORMAL LOW (ref >59)
Glucose: 129 mg/dL — ABNORMAL HIGH (ref 65–99)
Potassium: 4.8 mmol/L (ref 3.5–5.2)
Sodium: 137 mmol/L (ref 134–144)

## 2018-12-13 LAB — LIPID PANEL
Chol/HDL Ratio: 10.3 ratio — ABNORMAL HIGH (ref 0.0–4.4)
Cholesterol, Total: 372 mg/dL — ABNORMAL HIGH (ref 100–199)
HDL: 36 mg/dL — ABNORMAL LOW (ref >39)
LDL Chol Calc (NIH): 248 mg/dL — ABNORMAL HIGH (ref 0–99)
Triglycerides: 392 mg/dL — ABNORMAL HIGH (ref 0–149)
VLDL Cholesterol Cal: 88 mg/dL — ABNORMAL HIGH (ref 5–40)

## 2018-12-13 LAB — VITAMIN D 25 HYDROXY (VIT D DEFICIENCY, FRACTURES): Vit D, 25-Hydroxy: 50.7 ng/mL (ref 30.0–100.0)

## 2018-12-13 MED ORDER — ROSUVASTATIN CALCIUM 5 MG PO TABS: ORAL_TABLET | ORAL | 6 refills | Status: DC

## 2018-12-13 NOTE — Addendum Note (Signed)
Addended by: Vicente Males on: 12/13/2018 04:25 PM   Modules accepted: Orders

## 2018-12-19 ENCOUNTER — Other Ambulatory Visit: Payer: Self-pay

## 2018-12-19 ENCOUNTER — Ambulatory Visit (INDEPENDENT_AMBULATORY_CARE_PROVIDER_SITE_OTHER): Payer: Medicare Other | Admitting: *Deleted

## 2018-12-19 DIAGNOSIS — Z5181 Encounter for therapeutic drug level monitoring: Secondary | ICD-10-CM

## 2018-12-19 DIAGNOSIS — I82621 Acute embolism and thrombosis of deep veins of right upper extremity: Secondary | ICD-10-CM

## 2018-12-19 LAB — POCT INR: INR: 2.9 (ref 2.0–3.0)

## 2018-12-19 NOTE — Patient Instructions (Signed)
Continue coumadin 1/2 tablet daily except 1 tablet on Mondays, Wednesdays and Fridays Continue greens/salad Colonoscopy was cancelled per pt due to Covid 19 

## 2019-01-03 ENCOUNTER — Encounter: Payer: Self-pay | Admitting: Family Medicine

## 2019-01-03 DIAGNOSIS — R42 Dizziness and giddiness: Secondary | ICD-10-CM

## 2019-01-03 DIAGNOSIS — I951 Orthostatic hypotension: Secondary | ICD-10-CM

## 2019-01-03 DIAGNOSIS — R5383 Other fatigue: Secondary | ICD-10-CM

## 2019-01-03 DIAGNOSIS — R531 Weakness: Secondary | ICD-10-CM

## 2019-01-04 NOTE — Addendum Note (Signed)
Addended by: Vicente Males on: 01/04/2019 01:07 PM   Modules accepted: Orders

## 2019-01-04 NOTE — Telephone Encounter (Signed)
Given the patient's symptoms I would recommend the following CBC, liver, metabolic 7, TSH, CK Diagnosis orthostasis, fatigue, dizziness, weakness  I would recommend the patient do lab work.also recommend the patient does a follow-up in person office visit so we can check her blood pressure and examine her as well but the office visit should be a few days after her lab work so we will have the results

## 2019-01-09 DIAGNOSIS — R5383 Other fatigue: Secondary | ICD-10-CM | POA: Diagnosis not present

## 2019-01-09 DIAGNOSIS — I951 Orthostatic hypotension: Secondary | ICD-10-CM | POA: Diagnosis not present

## 2019-01-09 DIAGNOSIS — R531 Weakness: Secondary | ICD-10-CM | POA: Diagnosis not present

## 2019-01-09 DIAGNOSIS — R42 Dizziness and giddiness: Secondary | ICD-10-CM | POA: Diagnosis not present

## 2019-01-10 LAB — BASIC METABOLIC PANEL
BUN/Creatinine Ratio: 11 — ABNORMAL LOW (ref 12–28)
BUN: 12 mg/dL (ref 8–27)
CO2: 21 mmol/L (ref 20–29)
Calcium: 9.2 mg/dL (ref 8.7–10.3)
Chloride: 101 mmol/L (ref 96–106)
Creatinine, Ser: 1.07 mg/dL — ABNORMAL HIGH (ref 0.57–1.00)
GFR calc Af Amer: 58 mL/min/{1.73_m2} — ABNORMAL LOW (ref >59)
GFR calc non Af Amer: 50 mL/min/{1.73_m2} — ABNORMAL LOW (ref >59)
Glucose: 107 mg/dL — ABNORMAL HIGH (ref 65–99)
Potassium: 5.2 mmol/L (ref 3.5–5.2)
Sodium: 140 mmol/L (ref 134–144)

## 2019-01-10 LAB — HEPATIC FUNCTION PANEL
ALT: 19 IU/L (ref 0–32)
AST: 31 IU/L (ref 0–40)
Albumin: 3.9 g/dL (ref 3.7–4.7)
Alkaline Phosphatase: 99 IU/L (ref 39–117)
Bilirubin Total: 0.2 mg/dL (ref 0.0–1.2)
Bilirubin, Direct: 0.11 mg/dL (ref 0.00–0.40)
Total Protein: 7.1 g/dL (ref 6.0–8.5)

## 2019-01-10 LAB — CBC WITH DIFFERENTIAL/PLATELET
Basophils Absolute: 0.1 10*3/uL (ref 0.0–0.2)
Basos: 1 %
EOS (ABSOLUTE): 0.3 10*3/uL (ref 0.0–0.4)
Eos: 4 %
Hematocrit: 42.3 % (ref 34.0–46.6)
Hemoglobin: 13.3 g/dL (ref 11.1–15.9)
Immature Grans (Abs): 0 10*3/uL (ref 0.0–0.1)
Immature Granulocytes: 0 %
Lymphocytes Absolute: 2.4 10*3/uL (ref 0.7–3.1)
Lymphs: 33 %
MCH: 27.9 pg (ref 26.6–33.0)
MCHC: 31.4 g/dL — ABNORMAL LOW (ref 31.5–35.7)
MCV: 89 fL (ref 79–97)
Monocytes Absolute: 0.6 10*3/uL (ref 0.1–0.9)
Monocytes: 8 %
Neutrophils Absolute: 4 10*3/uL (ref 1.4–7.0)
Neutrophils: 54 %
Platelets: 378 10*3/uL (ref 150–450)
RBC: 4.77 x10E6/uL (ref 3.77–5.28)
RDW: 15.3 % (ref 11.7–15.4)
WBC: 7.3 10*3/uL (ref 3.4–10.8)

## 2019-01-10 LAB — CK: Total CK: 73 U/L (ref 32–182)

## 2019-01-10 LAB — TSH: TSH: 0.642 u[IU]/mL (ref 0.450–4.500)

## 2019-01-13 ENCOUNTER — Encounter: Payer: Self-pay | Admitting: Family Medicine

## 2019-01-30 ENCOUNTER — Ambulatory Visit (INDEPENDENT_AMBULATORY_CARE_PROVIDER_SITE_OTHER): Payer: Medicare Other | Admitting: *Deleted

## 2019-01-30 ENCOUNTER — Other Ambulatory Visit: Payer: Self-pay

## 2019-01-30 DIAGNOSIS — I82621 Acute embolism and thrombosis of deep veins of right upper extremity: Secondary | ICD-10-CM | POA: Diagnosis not present

## 2019-01-30 DIAGNOSIS — Z5181 Encounter for therapeutic drug level monitoring: Secondary | ICD-10-CM

## 2019-01-30 LAB — POCT INR: INR: 4 — AB (ref 2.0–3.0)

## 2019-01-30 NOTE — Patient Instructions (Signed)
Hold coumadin tonight then resume 1/2 tablet daily except 1 tablet on Mondays, Wednesdays and Fridays Continue greens/salad Colonoscopy was cancelled per pt due to Covid 19

## 2019-02-20 ENCOUNTER — Ambulatory Visit (INDEPENDENT_AMBULATORY_CARE_PROVIDER_SITE_OTHER): Payer: Medicare Other | Admitting: *Deleted

## 2019-02-20 ENCOUNTER — Other Ambulatory Visit: Payer: Self-pay

## 2019-02-20 DIAGNOSIS — I742 Embolism and thrombosis of arteries of the upper extremities: Secondary | ICD-10-CM | POA: Diagnosis not present

## 2019-02-20 DIAGNOSIS — I82621 Acute embolism and thrombosis of deep veins of right upper extremity: Secondary | ICD-10-CM

## 2019-02-20 DIAGNOSIS — Z5181 Encounter for therapeutic drug level monitoring: Secondary | ICD-10-CM | POA: Diagnosis not present

## 2019-02-20 LAB — POCT INR: INR: 2.8 (ref 2.0–3.0)

## 2019-02-20 NOTE — Patient Instructions (Signed)
Continue warfarin 1/2 tablet daily except 1 tablet on Mondays, Wednesdays and Fridays Continue greens/salad Colonoscopy was cancelled per pt due to Covid 19 

## 2019-02-26 ENCOUNTER — Other Ambulatory Visit: Payer: Self-pay | Admitting: Family Medicine

## 2019-02-27 ENCOUNTER — Other Ambulatory Visit: Payer: Self-pay

## 2019-03-15 ENCOUNTER — Encounter: Payer: Self-pay | Admitting: Family Medicine

## 2019-03-15 NOTE — Telephone Encounter (Signed)
Last med check 11/22/18.

## 2019-03-16 NOTE — Telephone Encounter (Signed)
May have 90-day with 1 refill

## 2019-03-18 MED ORDER — FAMOTIDINE 20 MG PO TABS: 20.0000 mg | ORAL_TABLET | Freq: Two times a day (BID) | ORAL | 1 refills | Status: DC

## 2019-03-18 MED ORDER — METOPROLOL SUCCINATE ER 25 MG PO TB24: ORAL_TABLET | ORAL | 1 refills | Status: DC

## 2019-03-18 MED ORDER — PAROXETINE HCL 20 MG PO TABS: ORAL_TABLET | ORAL | 1 refills | Status: DC

## 2019-03-18 NOTE — Addendum Note (Signed)
Addended by: Vicente Males on: 03/18/2019 11:24 AM   Modules accepted: Orders

## 2019-03-27 ENCOUNTER — Ambulatory Visit (INDEPENDENT_AMBULATORY_CARE_PROVIDER_SITE_OTHER): Payer: Medicare Other | Admitting: *Deleted

## 2019-03-27 ENCOUNTER — Other Ambulatory Visit: Payer: Self-pay

## 2019-03-27 DIAGNOSIS — Z5181 Encounter for therapeutic drug level monitoring: Secondary | ICD-10-CM | POA: Diagnosis not present

## 2019-03-27 DIAGNOSIS — I82621 Acute embolism and thrombosis of deep veins of right upper extremity: Secondary | ICD-10-CM | POA: Diagnosis not present

## 2019-03-27 LAB — POCT INR: INR: 2.7 (ref 2.0–3.0)

## 2019-03-27 NOTE — Patient Instructions (Signed)
Continue warfarin 1/2 tablet daily except 1 tablet on Mondays, Wednesdays and Fridays Continue greens/salad Colonoscopy was cancelled per pt due to Covid 19

## 2019-05-01 ENCOUNTER — Other Ambulatory Visit: Payer: Self-pay

## 2019-05-01 ENCOUNTER — Ambulatory Visit (INDEPENDENT_AMBULATORY_CARE_PROVIDER_SITE_OTHER): Payer: Medicare Other | Admitting: *Deleted

## 2019-05-01 DIAGNOSIS — Z5181 Encounter for therapeutic drug level monitoring: Secondary | ICD-10-CM

## 2019-05-01 DIAGNOSIS — I82621 Acute embolism and thrombosis of deep veins of right upper extremity: Secondary | ICD-10-CM | POA: Diagnosis not present

## 2019-05-01 LAB — POCT INR: INR: 3 (ref 2.0–3.0)

## 2019-05-01 NOTE — Patient Instructions (Signed)
Continue warfarin 1/2 tablet daily except 1 tablet on Mondays, Wednesdays and Fridays Continue greens/salad Colonoscopy was cancelled per pt due to Covid 19

## 2019-06-11 ENCOUNTER — Other Ambulatory Visit: Payer: Self-pay | Admitting: Family Medicine

## 2019-06-11 NOTE — Telephone Encounter (Signed)
Patient needs to do virtual visit within the next 30 days please go ahead with pending the medicine and I will sign it once you connect with her regarding appointment

## 2019-06-12 ENCOUNTER — Other Ambulatory Visit: Payer: Self-pay

## 2019-06-12 ENCOUNTER — Ambulatory Visit (INDEPENDENT_AMBULATORY_CARE_PROVIDER_SITE_OTHER): Payer: Medicare Other | Admitting: *Deleted

## 2019-06-12 DIAGNOSIS — Z5181 Encounter for therapeutic drug level monitoring: Secondary | ICD-10-CM | POA: Diagnosis not present

## 2019-06-12 DIAGNOSIS — I82621 Acute embolism and thrombosis of deep veins of right upper extremity: Secondary | ICD-10-CM

## 2019-06-12 LAB — POCT INR: INR: 2.6 (ref 2.0–3.0)

## 2019-06-12 NOTE — Telephone Encounter (Signed)
Please schedule and then route back to nurses to pend med for dr scott to sign

## 2019-06-12 NOTE — Telephone Encounter (Signed)
Med pended and ready to sign 

## 2019-06-12 NOTE — Telephone Encounter (Signed)
Scheduled 3/24

## 2019-06-12 NOTE — Patient Instructions (Signed)
Continue warfarin 1/2 tablet daily except 1 tablet on Mondays, Wednesdays and Fridays Continue greens/salad Colonoscopy was cancelled per pt due to Covid 19

## 2019-06-26 ENCOUNTER — Telehealth: Payer: Self-pay | Admitting: *Deleted

## 2019-06-26 ENCOUNTER — Other Ambulatory Visit: Payer: Self-pay

## 2019-06-26 ENCOUNTER — Ambulatory Visit (INDEPENDENT_AMBULATORY_CARE_PROVIDER_SITE_OTHER): Payer: Medicare Other | Admitting: Family Medicine

## 2019-06-26 ENCOUNTER — Other Ambulatory Visit: Payer: Self-pay | Admitting: *Deleted

## 2019-06-26 DIAGNOSIS — E7849 Other hyperlipidemia: Secondary | ICD-10-CM | POA: Diagnosis not present

## 2019-06-26 DIAGNOSIS — R531 Weakness: Secondary | ICD-10-CM

## 2019-06-26 DIAGNOSIS — K219 Gastro-esophageal reflux disease without esophagitis: Secondary | ICD-10-CM | POA: Diagnosis not present

## 2019-06-26 DIAGNOSIS — R0600 Dyspnea, unspecified: Secondary | ICD-10-CM | POA: Diagnosis not present

## 2019-06-26 DIAGNOSIS — I10 Essential (primary) hypertension: Secondary | ICD-10-CM | POA: Diagnosis not present

## 2019-06-26 DIAGNOSIS — R5383 Other fatigue: Secondary | ICD-10-CM | POA: Diagnosis not present

## 2019-06-26 DIAGNOSIS — R0609 Other forms of dyspnea: Secondary | ICD-10-CM

## 2019-06-26 DIAGNOSIS — R7303 Prediabetes: Secondary | ICD-10-CM

## 2019-06-26 MED ORDER — PANTOPRAZOLE SODIUM 40 MG PO TBEC: 40.0000 mg | DELAYED_RELEASE_TABLET | Freq: Every day | ORAL | 1 refills | Status: DC

## 2019-06-26 MED ORDER — PAROXETINE HCL 20 MG PO TABS: ORAL_TABLET | ORAL | 1 refills | Status: DC

## 2019-06-26 MED ORDER — METOPROLOL SUCCINATE ER 25 MG PO TB24: ORAL_TABLET | ORAL | 1 refills | Status: DC

## 2019-06-26 MED ORDER — SUCRALFATE 1 G PO TABS: ORAL_TABLET | ORAL | 5 refills | Status: DC

## 2019-06-26 MED ORDER — ROSUVASTATIN CALCIUM 5 MG PO TABS: ORAL_TABLET | ORAL | 5 refills | Status: DC

## 2019-06-26 NOTE — Telephone Encounter (Signed)
Re do rx to one bid plz

## 2019-06-26 NOTE — Telephone Encounter (Signed)
Pt's daughter Jacqueline Morgan called and states  that her mom started taking paxil one bid instead of one qam and 2 tablets at night. States she wasn't feeling good like "hazy" so a few days ago she cut back to one bid. And she forgot to tell dr Nicki Reaper. States she does not need a call back. She is following up with dr Nicki Reaper in a couple of weeks.do you want to refill her med with directions one bid or should pt go back to the way it was prescribed.

## 2019-06-26 NOTE — Progress Notes (Addendum)
Subjective:    Patient ID: Jacqueline Morgan, female    DOB: 11/30/1940, 79 y.o.   MRN: [id number]  Hyperlipidemia This is a chronic problem. Treatments tried: crestor 5mg  on mondays and fridays. Compliance problems: takes meds as prescribed, does not have any energy for exercise.    Fall Risk  02/27/2019 02/20/2018 11/01/2017 08/24/2015 10/25/2013  Falls in the past year? 0 0 No No -  Comment [facility] Telephone Survey: data to providers prior to load [employer] Telephone Survey: data to providers prior to load [facility] Telephone Survey: data to providers prior to load - -  Risk for fall due to : - - - - Other (Comment);History of fall(s)  Risk for fall due to: Comment - - - - Patient had bad stomach cramps & then passed out hitting her head while in bathroom.    Since switching from protonix to pepcid pt has been having reflux. Would like a refill of carafate.   Pt states just walking to mail box or taking a shower completely wears her out.   Left foot is sore. Started about one to two months.   Left arm pain when moving up or to the left. Started a few months ago.   Both hands go numb when holding telephone.   phq9 and gad 7 done Depression screen [facility] 2/9 06/26/2019 08/24/2015 10/25/2013  Decreased Interest 1 0 0  Down, Depressed, Hopeless 1 0 0  PHQ - 2 Score 2 0 0  Altered sleeping 0 - -  Tired, decreased energy 3 - -  Change in appetite 3 - -  Feeling bad or failure about yourself  0 - -  Trouble concentrating 0 - -  Moving slowly or fidgety/restless 0 - -  Suicidal thoughts 0 - -  PHQ-9 Score 8 - -   GAD 7 : Generalized Anxiety Score 06/26/2019  Nervous, Anxious, on Edge 3  Control/stop worrying 3  Worry too much - different things 3  Trouble relaxing 3  Restless 0  Easily annoyed or irritable 0  Afraid - awful might happen 3  Total GAD 7 Score 15     Virtual Visit via Telephone Note  I connected with Emanuelle Carmelia Roller on 06/26/19 at  2:00 PM EDT by telephone and verified that  I am speaking with the correct person using two identifiers.  Location: Patient: home Provider: office   I discussed the limitations, risks, security and privacy concerns of performing an evaluation and management service by telephone and the availability of in person appointments. I also discussed with the patient that there may be a patient responsible charge related to this service. The patient expressed understanding and agreed to proceed.   History of Present Illness:    Observations/Objective:   Assessment and Plan:   Follow Up Instructions:    I discussed the assessment and treatment plan with the patient. The patient was provided an opportunity to ask questions and all were answered. The patient agreed with the plan and demonstrated an understanding of the instructions.   The patient was advised to call back or seek an in-person evaluation if the symptoms worsen or if the condition fails to improve as anticipated.  I provided 24 minutes of non-face-to-face time during this encounter.  24 minutes was spent during this encounter with the patient talking on the phone additional time was spent documenting     Review of Systems     Objective:   Physical Exam    We will  see patient next week for face-to-face    Assessment & Plan:  1. Essential hypertension Patient reports blood pressure reportedly good control continue as is  2. Gastroesophageal reflux disease without esophagitis None under good control stop Pepcid use Protonix daily refill sent in also Carafate as needed  3. Other hyperlipidemia Watch diet continue medication no need to check lab work currently  4. Prediabetes Watch starches in the diet.  5. Weakness Significant weakness fatigue could be deconditioning could be medications but could also be underlying heart issues office visit recommended  6. Other fatigue Lab work recommended office visit recommended  7. DOE (dyspnea on exertion) Lab  work recommended office recommended possible EKG,ed

## 2019-06-28 DIAGNOSIS — R5383 Other fatigue: Secondary | ICD-10-CM | POA: Diagnosis not present

## 2019-06-28 DIAGNOSIS — K219 Gastro-esophageal reflux disease without esophagitis: Secondary | ICD-10-CM | POA: Diagnosis not present

## 2019-06-28 DIAGNOSIS — R531 Weakness: Secondary | ICD-10-CM | POA: Diagnosis not present

## 2019-06-28 DIAGNOSIS — R7303 Prediabetes: Secondary | ICD-10-CM | POA: Diagnosis not present

## 2019-06-28 DIAGNOSIS — I10 Essential (primary) hypertension: Secondary | ICD-10-CM | POA: Diagnosis not present

## 2019-06-28 DIAGNOSIS — R0600 Dyspnea, unspecified: Secondary | ICD-10-CM | POA: Diagnosis not present

## 2019-06-28 DIAGNOSIS — E7849 Other hyperlipidemia: Secondary | ICD-10-CM | POA: Diagnosis not present

## 2019-06-29 LAB — HEPATIC FUNCTION PANEL
ALT: 20 IU/L (ref 0–32)
AST: 33 IU/L (ref 0–40)
Albumin: 3.9 g/dL (ref 3.7–4.7)
Alkaline Phosphatase: 103 IU/L (ref 39–117)
Bilirubin Total: 0.2 mg/dL (ref 0.0–1.2)
Bilirubin, Direct: 0.09 mg/dL (ref 0.00–0.40)
Total Protein: 6.9 g/dL (ref 6.0–8.5)

## 2019-06-29 LAB — BASIC METABOLIC PANEL
BUN/Creatinine Ratio: 13 (ref 12–28)
BUN: 13 mg/dL (ref 8–27)
CO2: 22 mmol/L (ref 20–29)
Calcium: 9.1 mg/dL (ref 8.7–10.3)
Chloride: 102 mmol/L (ref 96–106)
Creatinine, Ser: 1.03 mg/dL — ABNORMAL HIGH (ref 0.57–1.00)
GFR calc Af Amer: 60 mL/min/{1.73_m2} (ref >59)
GFR calc non Af Amer: 52 mL/min/{1.73_m2} — ABNORMAL LOW (ref >59)
Glucose: 118 mg/dL — ABNORMAL HIGH (ref 65–99)
Potassium: 4.9 mmol/L (ref 3.5–5.2)
Sodium: 138 mmol/L (ref 134–144)

## 2019-06-29 LAB — CBC WITH DIFFERENTIAL/PLATELET
Basophils Absolute: 0.1 10*3/uL (ref 0.0–0.2)
Basos: 1 %
EOS (ABSOLUTE): 0.2 10*3/uL (ref 0.0–0.4)
Eos: 4 %
Hematocrit: 42 % (ref 34.0–46.6)
Hemoglobin: 13.7 g/dL (ref 11.1–15.9)
Immature Grans (Abs): 0 10*3/uL (ref 0.0–0.1)
Immature Granulocytes: 0 %
Lymphocytes Absolute: 2.9 10*3/uL (ref 0.7–3.1)
Lymphs: 42 %
MCH: 29 pg (ref 26.6–33.0)
MCHC: 32.6 g/dL (ref 31.5–35.7)
MCV: 89 fL (ref 79–97)
Monocytes Absolute: 0.6 10*3/uL (ref 0.1–0.9)
Monocytes: 8 %
Neutrophils Absolute: 3.1 10*3/uL (ref 1.4–7.0)
Neutrophils: 45 %
Platelets: 382 10*3/uL (ref 150–450)
RBC: 4.73 x10E6/uL (ref 3.77–5.28)
RDW: 14.7 % (ref 11.7–15.4)
WBC: 7 10*3/uL (ref 3.4–10.8)

## 2019-06-29 LAB — HEMOGLOBIN A1C
Est. average glucose Bld gHb Est-mCnc: 140 mg/dL
Hgb A1c MFr Bld: 6.5 % — ABNORMAL HIGH (ref 4.8–5.6)

## 2019-06-29 LAB — BRAIN NATRIURETIC PEPTIDE: BNP: 34.2 pg/mL (ref 0.0–100.0)

## 2019-07-02 ENCOUNTER — Other Ambulatory Visit: Payer: Self-pay | Admitting: Family Medicine

## 2019-07-03 ENCOUNTER — Ambulatory Visit (INDEPENDENT_AMBULATORY_CARE_PROVIDER_SITE_OTHER): Payer: Medicare Other | Admitting: Family Medicine

## 2019-07-03 ENCOUNTER — Encounter: Payer: Self-pay | Admitting: Family Medicine

## 2019-07-03 VITALS — BP 140/82 | Temp 97.2°F | Wt 175.8 lb

## 2019-07-03 DIAGNOSIS — R0600 Dyspnea, unspecified: Secondary | ICD-10-CM

## 2019-07-03 DIAGNOSIS — Z13820 Encounter for screening for osteoporosis: Secondary | ICD-10-CM

## 2019-07-03 DIAGNOSIS — I10 Essential (primary) hypertension: Secondary | ICD-10-CM

## 2019-07-03 DIAGNOSIS — R5383 Other fatigue: Secondary | ICD-10-CM

## 2019-07-03 DIAGNOSIS — R0609 Other forms of dyspnea: Secondary | ICD-10-CM

## 2019-07-03 DIAGNOSIS — Z780 Asymptomatic menopausal state: Secondary | ICD-10-CM | POA: Diagnosis not present

## 2019-07-03 MED ORDER — LORAZEPAM 1 MG PO TABS: ORAL_TABLET | ORAL | 4 refills | Status: DC

## 2019-07-03 MED ORDER — PANTOPRAZOLE SODIUM 40 MG PO TBEC: 40.0000 mg | DELAYED_RELEASE_TABLET | Freq: Every day | ORAL | 1 refills | Status: DC

## 2019-07-03 MED ORDER — MIRTAZAPINE 7.5 MG PO TABS: 7.5000 mg | ORAL_TABLET | Freq: Every day | ORAL | 5 refills | Status: DC

## 2019-07-03 NOTE — Progress Notes (Signed)
Subjective:    Patient ID: Jacqueline Morgan, female    DOB: 25-Feb-1941, 79 y.o.   MRN: [id number]  Hypertension Associated symptoms include shortness of breath. Pertinent negatives include no chest pain.  Patient comes in today for a check up on hypertension and to discuss blood work. + Results for orders placed or performed in visit on 06/26/19  Hepatic function panel  Result Value Ref Range   Total Protein 6.9 6.0 - 8.5 g/dL   Albumin 3.9 3.7 - 4.7 g/dL   Bilirubin Total 0.2 0.0 - 1.2 mg/dL   Bilirubin, Direct 0.09 0.00 - 0.40 mg/dL   Alkaline Phosphatase 103 39 - 117 IU/L   AST 33 0 - 40 IU/L   ALT 20 0 - 32 IU/L  Basic metabolic panel  Result Value Ref Range   Glucose 118 (H) 65 - 99 mg/dL   BUN 13 8 - 27 mg/dL   Creatinine, Ser 1.03 (H) 0.57 - 1.00 mg/dL   GFR calc non Af Amer 52 (L) >59 mL/min/1.73   GFR calc Af Amer 60 >59 mL/min/1.73   BUN/Creatinine Ratio 13 12 - 28   Sodium 138 134 - 144 mmol/L   Potassium 4.9 3.5 - 5.2 mmol/L   Chloride 102 96 - 106 mmol/L   CO2 22 20 - 29 mmol/L   Calcium 9.1 8.7 - 10.3 mg/dL  CBC with Differential/Platelet  Result Value Ref Range   WBC 7.0 3.4 - 10.8 x10E3/uL   RBC 4.73 3.77 - 5.28 x10E6/uL   Hemoglobin 13.7 11.1 - 15.9 g/dL   Hematocrit 42.0 34.0 - 46.6 %   MCV 89 79 - 97 fL   MCH 29.0 26.6 - 33.0 pg   MCHC 32.6 31.5 - 35.7 g/dL   RDW 14.7 11.7 - 15.4 %   Platelets 382 150 - 450 x10E3/uL   Neutrophils 45 Not Estab. %   Lymphs 42 Not Estab. %   Monocytes 8 Not Estab. %   Eos 4 Not Estab. %   Basos 1 Not Estab. %   Neutrophils Absolute 3.1 1.4 - 7.0 x10E3/uL   Lymphocytes Absolute 2.9 0.7 - 3.1 x10E3/uL   Monocytes Absolute 0.6 0.1 - 0.9 x10E3/uL   EOS (ABSOLUTE) 0.2 0.0 - 0.4 x10E3/uL   Basophils Absolute 0.1 0.0 - 0.2 x10E3/uL   Immature Granulocytes 0 Not Estab. %   Immature Grans (Abs) 0.0 0.0 - 0.1 x10E3/uL  Hemoglobin A1c  Result Value Ref Range   Hgb A1c MFr Bld 6.5 (H) 4.8 - 5.6 %   Est. average glucose  Bld gHb Est-mCnc 140 mg/dL  B Nat Peptide  Result Value Ref Range   BNP 34.2 0.0 - 100.0 pg/mL    Fall Risk  02/27/2019 02/20/2018 11/01/2017 08/24/2015 10/25/2013  Falls in the past year? 0 0 No No -  Comment [facility] Telephone Survey: data to providers prior to load [employer] Telephone Survey: data to providers prior to load [facility] Telephone Survey: data to providers prior to load - -  Risk for fall due to : - - - - Other (Comment);History of fall(s)  Risk for fall due to: Comment - - - - Patient had bad stomach cramps & then passed out hitting her head while in bathroom.    We reviewed over all her lab work in detail. Patient relates it to extreme fatigue when she walks around has to stop to lay down to get her energy back she also states at times gets out of breath with  things but denies any PND orthopnea.  Review of Systems  Constitutional: Positive for fatigue. Negative for activity change and appetite change.  HENT: Negative for congestion and rhinorrhea.   Respiratory: Positive for shortness of breath. Negative for cough.   Cardiovascular: Negative for chest pain and leg swelling.  Gastrointestinal: Negative for abdominal pain and diarrhea.  Endocrine: Negative for polydipsia and polyphagia.  Skin: Negative for color change.  Neurological: Positive for tremors and weakness. Negative for dizziness.  Psychiatric/Behavioral: Negative for behavioral problems and confusion.       Objective:   Physical Exam Vitals reviewed.  Constitutional:      General: She is not in acute distress. HENT:     Head: Normocephalic and atraumatic.  Eyes:     General:        Right eye: No discharge.        Left eye: No discharge.  Neck:     Trachea: No tracheal deviation.  Cardiovascular:     Rate and Rhythm: Normal rate and regular rhythm.     Heart sounds: Normal heart sounds. No murmur.  Pulmonary:     Effort: Pulmonary effort is normal. No respiratory distress.     Breath sounds: Normal breath  sounds.  Lymphadenopathy:     Cervical: No cervical adenopathy.  Skin:    General: Skin is warm and dry.  Neurological:     Mental Status: She is alert.     Coordination: Coordination normal.  Psychiatric:        Behavior: Behavior normal.           Assessment & Plan:  1. Essential hypertension Blood pressure under reasonable control continue current measures  2. Other fatigue Significant fatigue and tiredness.  Recent lab work looks good.  I am concerned about the low voltage on her EKG as well as how quickly she gets out of breath I believe she would benefit from seeing cardiology if cardiac follow-up overall looks good this could be deconditioning could also be pulmonary  3. DOE (dyspnea on exertion) Chest x-ray ordered await the results Cardiology consult Follow-up with Dr. Harl Bowie see above - DG Chest 2 View - PR ELECTROCARDIOGRAM, COMPLETE  4. Screening for osteoporosis Bone density recommended - DG Bone Density  5. Post-menopausal Bone density recommended - DG Bone Density

## 2019-07-05 ENCOUNTER — Encounter: Payer: Self-pay | Admitting: Family Medicine

## 2019-07-10 ENCOUNTER — Other Ambulatory Visit: Payer: Self-pay | Admitting: Family Medicine

## 2019-07-17 ENCOUNTER — Ambulatory Visit (HOSPITAL_COMMUNITY)
Admission: RE | Admit: 2019-07-17 | Discharge: 2019-07-17 | Disposition: A | Discharge status: Home / Self Care | Payer: Medicare Other | Source: Ambulatory Visit | Attending: Family Medicine | Admitting: Family Medicine

## 2019-07-17 ENCOUNTER — Other Ambulatory Visit: Payer: Self-pay

## 2019-07-17 DIAGNOSIS — R0600 Dyspnea, unspecified: Secondary | ICD-10-CM | POA: Insufficient documentation

## 2019-07-17 DIAGNOSIS — M810 Age-related osteoporosis without current pathological fracture: Secondary | ICD-10-CM | POA: Diagnosis not present

## 2019-07-17 DIAGNOSIS — Z13820 Encounter for screening for osteoporosis: Secondary | ICD-10-CM | POA: Diagnosis not present

## 2019-07-17 DIAGNOSIS — R0602 Shortness of breath: Secondary | ICD-10-CM | POA: Diagnosis not present

## 2019-07-17 DIAGNOSIS — Z780 Asymptomatic menopausal state: Secondary | ICD-10-CM | POA: Insufficient documentation

## 2019-07-23 ENCOUNTER — Encounter: Payer: Self-pay | Admitting: Family Medicine

## 2019-07-23 ENCOUNTER — Other Ambulatory Visit: Payer: Self-pay

## 2019-07-23 ENCOUNTER — Ambulatory Visit (INDEPENDENT_AMBULATORY_CARE_PROVIDER_SITE_OTHER): Payer: Medicare Other | Admitting: Family Medicine

## 2019-07-23 VITALS — BP 132/82 | Temp 97.6°F | Ht 61.0 in | Wt 175.0 lb

## 2019-07-23 DIAGNOSIS — M5431 Sciatica, right side: Secondary | ICD-10-CM

## 2019-07-23 DIAGNOSIS — M816 Localized osteoporosis [Lequesne]: Secondary | ICD-10-CM

## 2019-07-23 MED ORDER — ALENDRONATE SODIUM 70 MG PO TABS: 70.0000 mg | ORAL_TABLET | ORAL | 3 refills | Status: DC

## 2019-07-23 NOTE — Progress Notes (Signed)
   Subjective:    Patient ID: Jacqueline Morgan, female    DOB: 12-06-1940, 79 y.o.   MRN: [id number]  HPI  Patient arrives to discuss bone density results  Patient also having problems with sciatica in right leg from hip to ankle Started last week Got worse Radiates down the leg Burning and numbness Hurts with stanfing Worse with actibyt Tylenol helps a little not s;leeping well  never had before Patient relates moderate sciatica symptoms.  Difficult to raise leg high because of the pain.  Relates low back pain and discomfort and aching Denies weakness in the leg other than the weakness that occurs because of the pain  Patient also to discuss bone density testing we reviewed over how it showed osteoporosis in her hips and other areas osteopenia we also reviewed how the medication can be helpful but can cause reflux and also discussed possibility of injectable medicines if the oral medicine is not tolerated.  Patient had a higher risk of accidental falls and injury potential fracture therefore needs to treat the osteoporosis  Fall Risk  07/03/2019 02/27/2019 02/20/2018 11/01/2017 08/24/2015  Falls in the past year? 1 0 0 No No  Comment - [facility] Telephone Survey: data to providers prior to load [employer] Telephone Survey: data to providers prior to load [employer] Telephone Survey: data to providers prior to load -  Number falls in past yr: 0 - - - -  Injury with Fall? 0 - - - -  Risk for fall due to : Impaired balance/gait;History of fall(s) - - - -  Risk for fall due to: Comment - - - - -  Follow up Education provided;Falls evaluation completed - - - -    Review of Systems     Objective:   Physical Exam Lungs are clear respiratory rate normal heart regular no murmurs positive straight leg raise on the right negative on the left extremities no edema skin warm dry strength is best I can tell is normal bilateral osteoporosis hip       Assessment & Plan:  Osteoporosis-we discussed the  benefits and risk of Fosamax.  Also discussed how this can trigger reflux related issues esophagitis.  Discussed proper way to take it.  Patient hesitant about medicines but understands the importance and is willing to try it she will follow-up again in 3 to 4 months and she will notify us if it is causing any problems Potential IV Jaclyn Prime is a choice if this does not work out well for her  Sciatica right leg conservative measures discussed stretches were shown printouts given use Tylenol for discomfort.  Avoid anti-inflammatories.  Patient does not want to utilize any type of pain medicine and muscle relaxers are a bad idea given her age if she is not dramatically better the next few weeks she will let us know

## 2019-07-24 ENCOUNTER — Ambulatory Visit (INDEPENDENT_AMBULATORY_CARE_PROVIDER_SITE_OTHER): Payer: Medicare Other | Admitting: *Deleted

## 2019-07-24 DIAGNOSIS — Z5181 Encounter for therapeutic drug level monitoring: Secondary | ICD-10-CM | POA: Diagnosis not present

## 2019-07-24 DIAGNOSIS — I82621 Acute embolism and thrombosis of deep veins of right upper extremity: Secondary | ICD-10-CM | POA: Diagnosis not present

## 2019-07-24 LAB — POCT INR: INR: 3.5 — AB (ref 2.0–3.0)

## 2019-07-24 NOTE — Patient Instructions (Signed)
Hold warfarin tonight then resume 1/2 tablet daily except 1 tablet on Mondays, Wednesdays and Fridays Continue greens/salad Colonoscopy was cancelled per pt due to Covid 19

## 2019-07-25 ENCOUNTER — Ambulatory Visit (INDEPENDENT_AMBULATORY_CARE_PROVIDER_SITE_OTHER): Payer: Medicare Other | Admitting: Cardiology

## 2019-07-25 ENCOUNTER — Other Ambulatory Visit: Payer: Self-pay

## 2019-07-25 ENCOUNTER — Encounter: Payer: Self-pay | Admitting: Cardiology

## 2019-07-25 VITALS — BP 140/88 | HR 83 | Ht 60.0 in | Wt 172.0 lb

## 2019-07-25 DIAGNOSIS — I742 Embolism and thrombosis of arteries of the upper extremities: Secondary | ICD-10-CM

## 2019-07-25 DIAGNOSIS — R0609 Other forms of dyspnea: Secondary | ICD-10-CM

## 2019-07-25 DIAGNOSIS — R0600 Dyspnea, unspecified: Secondary | ICD-10-CM | POA: Diagnosis not present

## 2019-07-25 DIAGNOSIS — E782 Mixed hyperlipidemia: Secondary | ICD-10-CM

## 2019-07-25 DIAGNOSIS — R0602 Shortness of breath: Secondary | ICD-10-CM

## 2019-07-25 NOTE — Progress Notes (Addendum)
Clinical Summary Jacqueline Morgan is a 79 y.o.female seen today for follow up of the following medical problems.   1. 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  - underwent left arm thromboembolectomy 12/19/12 by Dr Adele Barthel  - echo was negative for thrombus. There was some question of possible aortic arch atherosclerosis. Discharged on coumadin. - denies any bleeding issues on coumadin. Notes some occsaional pain in left arm with activity that is unchanged..  - has not been in favor of NOACs   - no bleeding coumadin.    2. Hyperlipidemia 10/2017 TC 368 TG 374 HDL 36 LDL 257 9 /2020 TC 372 TG 392 LDL 248 - had tried crestor 5mg  every other daya few years ago, did not tolerate due to muscle aches.. She reports side effects on lipitorin the past. She is unsure of her other statin histoyr.  -. She reports palpitations on pravastatin and she stopped taking.   - taking crestor twice a week, tolerating - she reports muscle aches on zetia, stopped taking - she turns down pcsk 9 inhibitors    3. DOE  - recent symptoms with short distances - chronic cough, wheezing she reports 2017 PFTs normal CXR no acute process BNP 34  - no recent edema. - no palpitations, no chest pains     SH: sister passed awayNormal Jacqueline Morgan, who was also a patientof mine.   Past Medical History:  Diagnosis Date  . Anxiety   . Depression   . Fibromyalgia   . GERD (gastroesophageal reflux disease)   . Hyperlipidemia   . Hypertension   . Palpitations   . 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 Codeine     REACTION: Adverse gastrointestinal effects  . Statins     Felt bad     Current Outpatient Medications  Medication Sig Dispense Refill  . acetaminophen (TYLENOL) 500 MG tablet Take 1,000 mg by mouth once as needed. For headache/pain    . alendronate (FOSAMAX) 70  MG tablet Take 1 tablet (70 mg total) by mouth every 7 (seven) days. Take with a full glass of water on an empty stomach. 12 tablet 3  . LORazepam (ATIVAN) 1 MG tablet TAKE (1/2) TO (1) TABLET TWICE DAILY AS NEEDED. 60 tablet 4  . metoprolol succinate (TOPROL-XL) 25 MG 24 hr tablet TAKE 1 TABLET BY MOUTH  DAILY WITH OR IMMEDIATELY  FOLLOWING A MEAL 90 tablet 1  . mirtazapine (REMERON) 7.5 MG tablet Take 1 tablet (7.5 mg total) by mouth at bedtime. 30 tablet 5  . pantoprazole (PROTONIX) 40 MG tablet Take 1 tablet (40 mg total) by mouth daily. 180 tablet 1  . PARoxetine (PAXIL) 20 MG tablet TAKE 1 TABLET BID 180 tablet 1  . rosuvastatin (CRESTOR) 5 MG tablet Take one tablet by mouth on Mondays and tablet po on Fridays 8 tablet 5  . sucralfate (CARAFATE) 1 g tablet TAKE 1 TABLET BY MOUTH BEFORE MEALS AND AT BEDTIME. 120 tablet 5  . warfarin (COUMADIN) 5 MG tablet TAKE 1/2 -1 TABLET BY MOUTH DAILY AS DIRECTED. 30 tablet 6   No current facility-administered medications for this visit.     Past Surgical History:  Procedure Laterality Date  . COLONOSCOPY N/A 04/01/2015   Procedure: COLONOSCOPY;  Surgeon: Rogene Houston, MD;  [facility];  Service: Endoscopy;  Laterality: N/A;  1:45  .  EMBOLECTOMY Left 12/19/2012   Procedure: EMBOLECTOMY OF LEFT BRACHIAL ARTERY;  Surgeon: Conrad Montrose, MD;  Location: [place];  Service: Vascular;  Laterality: Left;  . EYE SURGERY       Allergies  Allergen Reactions  . Codeine     REACTION: Adverse gastrointestinal effects  . Statins     Felt bad      Family History  Problem Relation Age of Onset  . Cancer Mother        ovarian, colon  . Heart disease Mother   . Heart attack Father   . Glaucoma Father   . COPD Sister   . Benign prostatic hyperplasia Brother      Social History Jacqueline Morgan reports that she has never smoked. She has never used smokeless tobacco. Jacqueline Morgan reports no history of alcohol use.   Review of  Systems CONSTITUTIONAL: No weight loss, fever, chills, weakness or fatigue.  HEENT: Eyes: No visual loss, blurred vision, double vision or yellow sclerae.No hearing loss, sneezing, congestion, runny nose or sore throat.  SKIN: No rash or itching.  CARDIOVASCULAR: per hpi RESPIRATORY: No shortness of breath, cough or sputum.  GASTROINTESTINAL: No anorexia, nausea, vomiting or diarrhea. No abdominal pain or blood.  GENITOURINARY: No burning on urination, no polyuria NEUROLOGICAL: No headache, dizziness, syncope, paralysis, ataxia, numbness or tingling in the extremities. No change in bowel or bladder control.  MUSCULOSKELETAL: No muscle, back pain, joint pain or stiffness.  LYMPHATICS: No enlarged nodes. No history of splenectomy.  PSYCHIATRIC: No history of depression or anxiety.  ENDOCRINOLOGIC: No reports of sweating, cold or heat intolerance. No polyuria or polydipsia.  [name]   Physical Examination Today's Vitals   07/25/19 1431  BP: 140/88  Pulse: 83  Weight: 172 lb (78 kg)  Height: 5' (1.524 m)   Body mass index is 33.59 kg/m.  Gen: resting comfortably, no acute distress HEENT: no scleral icterus, pupils equal round and reactive, no palptable cervical adenopathy,  CV: RRR, no m/r/g, no jvd Resp: Clear to auscultation bilaterally GI: abdomen is soft, non-tender, non-distended, normal bowel sounds, no hepatosplenomegaly MSK: extremities are warm, no edema.  Skin: warm, no rash Neuro:  no focal deficits Psych: appropriate affect   Diagnostic Studies  05/2013 21 day Event monitor No arrythmias  07/2015 echo Study Conclusions  - Left ventricle: The cavity size was normal. Wall thickness was  increased in a pattern of mild LVH. Systolic function was normal.  The estimated ejection fraction was in the range of 60% to 65%.  Wall motion was normal; there were no regional wall motion  abnormalities. Doppler parameters are consistent with abnormal  left ventricular  relaxation (grade 1 diastolic dysfunction). - Aortic valve: Mildly calcified annulus. Trileaflet; mildly  thickened leaflets. Valve area (VTI): 2.21 cm^2. Valve area  (Vmax): 2.35 cm^2. - Mitral valve: Mildly calcified annulus. Mildly thickened leaflets  . - Technically adequate study.  07/2015 LUE Arterial US IMPRESSION: Left upper extremity arterial noninvasive study demonstrates no evidence of significant arterial occlusive disease.  10/2015 Lexiscan MPI  There was no ST segment deviation noted during stress.  The study is normal. There are no perfusion defects consistent with prior infarct or current ischemia.  This is a low risk study.  The left ventricular ejection fraction is normal (55-65%).   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 Hyperlipidemia -side effects  on statins and zetia, she turns down pcsk 9 inhibitors - continue crestor 2 times a week  3. DOE - unclear etiology - we will obtain echo - EKG today shows NSR, low voltrage     F/u 2 months      Arnoldo Lenis, M.D.

## 2019-07-25 NOTE — Patient Instructions (Signed)
Your physician recommends that you schedule a follow-up appointment in: 2 MONTHS WITH DR BRANCH  Your physician recommends that you continue on your current medications as directed. Please refer to the Current Medication list given to you today.  Your physician has requested that you have an echocardiogram. Echocardiography is a painless test that uses sound waves to create images of your heart. It provides your doctor with information about the size and shape of your heart and how well your heart's chambers and valves are working. This procedure takes approximately one hour. There are no restrictions for this procedure.  Thank you for choosing [facility]!!     

## 2019-07-26 ENCOUNTER — Other Ambulatory Visit: Payer: Self-pay | Admitting: Family Medicine

## 2019-08-21 ENCOUNTER — Ambulatory Visit (INDEPENDENT_AMBULATORY_CARE_PROVIDER_SITE_OTHER): Payer: Medicare Other

## 2019-08-21 ENCOUNTER — Other Ambulatory Visit: Payer: Self-pay

## 2019-08-21 ENCOUNTER — Ambulatory Visit (INDEPENDENT_AMBULATORY_CARE_PROVIDER_SITE_OTHER): Payer: Medicare Other | Admitting: Pharmacist

## 2019-08-21 DIAGNOSIS — Z5181 Encounter for therapeutic drug level monitoring: Secondary | ICD-10-CM | POA: Diagnosis not present

## 2019-08-21 DIAGNOSIS — I82621 Acute embolism and thrombosis of deep veins of right upper extremity: Secondary | ICD-10-CM

## 2019-08-21 DIAGNOSIS — R0602 Shortness of breath: Secondary | ICD-10-CM | POA: Diagnosis not present

## 2019-08-21 LAB — POCT INR: INR: 3.5 — AB (ref 2.0–3.0)

## 2019-08-21 NOTE — Patient Instructions (Signed)
Description   Skip your Coumadin today, then continue taking 1/2 tablet daily except 1 tablet on Mondays, Wednesdays and Fridays Recheck INR in 3 weeks

## 2019-08-27 ENCOUNTER — Telehealth: Payer: Self-pay | Admitting: *Deleted

## 2019-08-27 ENCOUNTER — Telehealth (INDEPENDENT_AMBULATORY_CARE_PROVIDER_SITE_OTHER): Payer: Self-pay | Admitting: *Deleted

## 2019-08-27 NOTE — Telephone Encounter (Signed)
Patient called and left a message. She states that she has had diarrhea for 3 days. When she wipes it is just blood. Patient has a TCS in December 2016. Last OV was with Lelon Perla 06/07/2018. Patient call back number is [phone].  To discuss with Provider and get recommendation. Patient will need a return call.

## 2019-08-27 NOTE — Telephone Encounter (Signed)
Pt aware - routed to pcp  

## 2019-08-27 NOTE — Telephone Encounter (Signed)
-----   Message from Arnoldo Lenis, MD sent at 08/27/2019 10:37 AM EDT ----- Echo looks good, normal heart functino  Zandra Abts MD

## 2019-08-28 ENCOUNTER — Telehealth (INDEPENDENT_AMBULATORY_CARE_PROVIDER_SITE_OTHER): Payer: Self-pay | Admitting: Internal Medicine

## 2019-08-28 ENCOUNTER — Other Ambulatory Visit (INDEPENDENT_AMBULATORY_CARE_PROVIDER_SITE_OTHER): Payer: Self-pay | Admitting: *Deleted

## 2019-08-28 ENCOUNTER — Ambulatory Visit (INDEPENDENT_AMBULATORY_CARE_PROVIDER_SITE_OTHER): Payer: Medicare Other | Admitting: Gastroenterology

## 2019-08-28 ENCOUNTER — Other Ambulatory Visit (HOSPITAL_COMMUNITY)
Admission: RE | Admit: 2019-08-28 | Discharge: 2019-08-28 | Disposition: A | Discharge status: Home / Self Care | Payer: Medicare Other | Source: Ambulatory Visit | Attending: Gastroenterology | Admitting: Gastroenterology

## 2019-08-28 ENCOUNTER — Other Ambulatory Visit: Payer: Self-pay

## 2019-08-28 ENCOUNTER — Other Ambulatory Visit (INDEPENDENT_AMBULATORY_CARE_PROVIDER_SITE_OTHER): Payer: Self-pay | Admitting: Gastroenterology

## 2019-08-28 ENCOUNTER — Encounter (INDEPENDENT_AMBULATORY_CARE_PROVIDER_SITE_OTHER): Payer: Self-pay | Admitting: Gastroenterology

## 2019-08-28 VITALS — BP 122/67 | HR 122 | Temp 97.2°F | Ht 60.0 in | Wt 173.0 lb

## 2019-08-28 DIAGNOSIS — K921 Melena: Secondary | ICD-10-CM

## 2019-08-28 DIAGNOSIS — K219 Gastro-esophageal reflux disease without esophagitis: Secondary | ICD-10-CM | POA: Diagnosis not present

## 2019-08-28 DIAGNOSIS — R944 Abnormal results of kidney function studies: Secondary | ICD-10-CM

## 2019-08-28 DIAGNOSIS — K625 Hemorrhage of anus and rectum: Secondary | ICD-10-CM | POA: Diagnosis not present

## 2019-08-28 DIAGNOSIS — R197 Diarrhea, unspecified: Secondary | ICD-10-CM

## 2019-08-28 DIAGNOSIS — Z7901 Long term (current) use of anticoagulants: Secondary | ICD-10-CM

## 2019-08-28 DIAGNOSIS — Z86010 Personal history of colonic polyps: Secondary | ICD-10-CM

## 2019-08-28 LAB — CBC
HCT: 40.9 % (ref 36.0–46.0)
Hemoglobin: 13 g/dL (ref 12.0–15.0)
MCH: 29.9 pg (ref 26.0–34.0)
MCHC: 31.8 g/dL (ref 30.0–36.0)
MCV: 94 fL (ref 80.0–100.0)
Platelets: 403 10*3/uL — ABNORMAL HIGH (ref 150–400)
RBC: 4.35 MIL/uL (ref 3.87–5.11)
RDW: 16.3 % — ABNORMAL HIGH (ref 11.5–15.5)
WBC: 10.3 10*3/uL (ref 4.0–10.5)
nRBC: 0 % (ref 0.0–0.2)

## 2019-08-28 LAB — COMPREHENSIVE METABOLIC PANEL
ALT: 20 U/L (ref 0–44)
AST: 35 U/L (ref 15–41)
Albumin: 3.4 g/dL — ABNORMAL LOW (ref 3.5–5.0)
Alkaline Phosphatase: 83 U/L (ref 38–126)
Anion gap: 12 (ref 5–15)
BUN: 15 mg/dL (ref 8–23)
CO2: 21 mmol/L — ABNORMAL LOW (ref 22–32)
Calcium: 8.7 mg/dL — ABNORMAL LOW (ref 8.9–10.3)
Chloride: 101 mmol/L (ref 98–111)
Creatinine, Ser: 1.42 mg/dL — ABNORMAL HIGH (ref 0.44–1.00)
GFR calc Af Amer: 41 mL/min — ABNORMAL LOW (ref >60)
GFR calc non Af Amer: 35 mL/min — ABNORMAL LOW (ref >60)
Glucose, Bld: 171 mg/dL — ABNORMAL HIGH (ref 70–99)
Potassium: 3.9 mmol/L (ref 3.5–5.1)
Sodium: 134 mmol/L — ABNORMAL LOW (ref 135–145)
Total Bilirubin: 0.7 mg/dL (ref 0.3–1.2)
Total Protein: 7.5 g/dL (ref 6.5–8.1)

## 2019-08-28 LAB — PROTIME-INR
INR: 1.6 — ABNORMAL HIGH (ref 0.8–1.2)
Prothrombin Time: 18.4 seconds — ABNORMAL HIGH (ref 11.4–15.2)

## 2019-08-28 NOTE — Telephone Encounter (Signed)
Please make office appointment for today with Thayer Headings.

## 2019-08-28 NOTE — Telephone Encounter (Signed)
Patient needs to be seen for stool studies, labs, etc. Can she come in today? Okay to add onto my schedule.

## 2019-08-28 NOTE — Progress Notes (Signed)
Patient profile: Jacqueline Morgan is a 79 y.o. female seen for evaluation of rectal bleeding. Called office this morning with acute symptoms    History of Present Illness: Jacqueline Morgan is seen today for evaluation of acute symptoms that started 4 days ago.  She reports the first 2 days having diarrhea "all day and night" with loose liquid stools several times a day.  She has some abdominal cramping with the diarrhea.  Yesterday noted last 2 episodes of diarrhea that had a reddish-brown color to stool suggesting GI bleeding. Diarrhea seems slightly slowed today.   She has chronic nausea which is not worsened since the diarrhea began and has not had any vomiting.  She has had a limited appetite with the diarrhea.  Yesterday only had a boiled egg and half a bottle of water.  She has had poor oral intake for several days since symptoms began.   She reports she usually takes Protonix daily for reflux which she has been out of x 1 week and is taking Pepcid isntead.  She also uses Carafate as needed for reflux and heartburn a few times a month. She denies any dysphagia.  Reports when she saw the blood yesterday she did not take her Coumadin last night. She also had an INR of 3.5 last week and was recommended to skip a dose then as well  Wt Readings from Last 3 Encounters:  08/28/19 173 lb (78.5 kg)  07/25/19 172 lb (78 kg)  07/23/19 175 lb (79.4 kg)     Last Colonoscopy:  03/2015:Prep excellent.  6 mm sessile cecal polyp was cold snared.  Seven small polyps were cold snared from ascending colon.  10 mm broad-based polyp hot snared from ascending colon. Single 360 Applied to  polypectomy site to reduce risk of post polypectomy bleed.  8 mm polyp hot snared from proximal transverse colon.  Three small polyps were cold snared from transverse colon.  Two small polyps were cold snared from descending colon.  Multiple diverticula at sigmoid colon.  All hemorrhoids below the dentate  line  Path-2 polyps are sessile serrated polyps and the rest tubular adenomas. Results reviewed with patient. Next colonoscopy in 3 years.    Past Medical History:  Past Medical History:  Diagnosis Date  . Anxiety   . Depression   . Fibromyalgia   . GERD (gastroesophageal reflux disease)   . Hyperlipidemia   . Hypertension   . Palpitations   . Tremor, essential 11/30/2015    Problem List: Patient Active Problem List   Diagnosis Date Noted  . History of colonic polyps 06/07/2018  . Gastroesophageal reflux disease without esophagitis 06/07/2018  . Osteoporosis 01/26/2018  . Prediabetes 10/25/2017  . GAD (generalized anxiety disorder) 01/27/2017  . Subjective visual disturbance 11/30/2015  . Tremor, essential 11/30/2015  . GERD (gastroesophageal reflux disease) 06/24/2014  . Multiple thyroid nodules 06/24/2014  . Encounter for therapeutic drug monitoring 05/06/2013  . 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 PAD (peripheral artery disease) ([place]) 12/31/2012  . DVT of upper extremity (deep vein thrombosis) ([place]) 12/24/2012  . Long term (current) use of anticoagulants 12/24/2012  . GASTROESOPHAGEAL REFLUX DISEASE 05/19/2009  . FIBROMYALGIA 05/19/2009  . SYNCOPE 05/19/2009  . Hyperglycemia 05/19/2009  . Hyperlipidemia 05/07/2009  . Panic attacks 05/07/2009  . DEPRESSION 05/07/2009  . Essential hypertension 05/07/2009    Past Surgical History: Past Surgical History:  Procedure Laterality Date  . COLONOSCOPY N/A 04/01/2015   Procedure: COLONOSCOPY;  Surgeon: Mechele Dawley  Laural Golden, MD;  [facility];  Service: Endoscopy;  Laterality: N/A;  1:45  . EMBOLECTOMY Left 12/19/2012   Procedure: EMBOLECTOMY OF LEFT BRACHIAL ARTERY;  Surgeon: Conrad Greensville, MD;  Location: [place];  Service: Vascular;  Laterality: Left;  . EYE SURGERY      Allergies: Allergies  Allergen Reactions  . Codeine     REACTION: Adverse gastrointestinal effects  . Statins     Felt bad       Home Medications:  Current Outpatient Medications:  .  acetaminophen (TYLENOL) 500 MG tablet, Take 1,000 mg by mouth once as needed. For headache/pain, Disp: , Rfl:  .  alendronate (FOSAMAX) 70 MG tablet, Take 1 tablet (70 mg total) by mouth every 7 (seven) days. Take with a full glass of water on an empty stomach., Disp: 12 tablet, Rfl: 3 .  LORazepam (ATIVAN) 1 MG tablet, TAKE (1/2) TO (1) TABLET TWICE DAILY AS NEEDED., Disp: 60 tablet, Rfl: 4 .  metoprolol succinate (TOPROL-XL) 25 MG 24 hr tablet, TAKE 1 TABLET BY MOUTH  DAILY WITH OR IMMEDIATELY  FOLLOWING A MEAL, Disp: 90 tablet, Rfl: 1 .  mirtazapine (REMERON) 7.5 MG tablet, Take 1 tablet (7.5 mg total) by mouth at bedtime., Disp: 30 tablet, Rfl: 5 .  pantoprazole (PROTONIX) 40 MG tablet, Take 1 tablet (40 mg total) by mouth daily., Disp: 180 tablet, Rfl: 1 .  PARoxetine (PAXIL) 20 MG tablet, TAKE 1 TABLET BID, Disp: 180 tablet, Rfl: 1 .  sucralfate (CARAFATE) 1 g tablet, TAKE 1 TABLET BY MOUTH BEFORE MEALS AND AT BEDTIME., Disp: 120 tablet, Rfl: 5 .  warfarin (COUMADIN) 5 MG tablet, TAKE 1/2 -1 TABLET BY MOUTH DAILY AS DIRECTED., Disp: 30 tablet, Rfl: 6 .  rosuvastatin (CRESTOR) 5 MG tablet, Take one tablet by mouth on Mondays and tablet po on Fridays (Patient not taking: Reported on 08/28/2019), Disp: 8 tablet, Rfl: 5   Family History: family history includes Benign prostatic hyperplasia in her brother; COPD in her sister; Cancer in her mother; Glaucoma in her father; Heart attack in her father; Heart disease in her mother.    Social History:   reports that she has never smoked. She has never used smokeless tobacco. She reports that she does not drink alcohol or use drugs.   Review of Systems: Constitutional: Denies weight loss/weight gain  Eyes: No changes in vision. ENT: No oral lesions, sore throat.  GI: see HPI.  Heme/Lymph: No easy bruising.  CV: No chest pain.  GU: No hematuria.  Integumentary: No rashes.  Neuro: No  headaches.  Psych: No depression/anxiety.  Endocrine: No heat/cold intolerance.  Allergic/Immunologic: No urticaria.  Resp: No cough, SOB.  Musculoskeletal: No joint swelling.    Physical Examination: BP 122/67 (BP Location: Right Arm, Patient Position: Sitting, Cuff Size: Large)   Pulse (!) 122   Temp (!) 97.2 F (36.2 C) (Temporal)   Ht 5' (1.524 m)   Wt 173 lb (78.5 kg)   BMI 33.79 kg/m  Gen: NAD, alert and oriented x 4, + tremor  HEENT: PEERLA, EOMI, Neck: supple, no JVD Chest: CTA bilaterally, no wheezes, crackles, or other adventitious sounds CV: tachycardia, no m/g/c/r Abd: soft, NT, ND, +BS in all four quadrants; no HSM, guarding, ridigity, or rebound tenderness RECTAL---dark maroon flecks in rectal vault, grossly occult positive.  Ext: no edema, well perfused with 2+ pulses, Skin: no rash or lesions noted on observed skin Lymph: no noted LAD  Data Reviewed:  08/21/19-INR 3.5  07/24/19-INR 3.07 June 2019 labs-LFTs normal, BMP with glucose 118, GFR 52, CBC with hemoglobin 13.7, A1c 6.5  Assessment/Plan: Ms. Skates is a 79 y.o. female    Oxly was seen today for follow-up.  Diagnoses and all orders for this visit:  Diarrhea, unspecified type  Chronic GERD  Rectal bleeding  Long term (current) use of anticoagulants     1.  Rectal bleeding/diarrhea-broad differential, may be infectious diarrhea with anal outlet bleeding exacerbated by her anticoagulation.  She is tachycardic in the office which is of concern and likely is dehydrated given her very limited oral intake over the past 3 days.  She also had an elevated INR last week and has held her Coumadin since she has seen the rectal bleeding.  We discussed emergent evaluation in the ER but she would like to avoid this.  We will send lab orders to [facility] to be done today for CBC, CMP, INR.  We will also order stool studies to exclude infection.  Further recommendations pending lab results.  She is  overdue for colonoscopy for hx colon polyps and ultimately needs this when stable.  Reports she started Coumadin for a blood clot in her arm but has held without issues in the past. Denies prior issues w/ sedation.    16:50pm  Addendum - STAT labs show Na 134, glucose 171, albumin 3.4, Cr 1.42 (baseline 1.03), INR 1.6, K 3.9, CBC /w normal Hgb 13.0, platelet 403  I discussed via phone w/ her daughter Olivia Mackie at patient's request. Labs show dehydration, patient is going to try and increase her fluid intake.  She does feel her diarrhea has slowed some the past 24 hours-stool studies pending. We will plan for a repeat BMP early next week to ensure kidney function is returned to normal.  Reviewed if diarrhea continues or is unable to have adequate oral intake needs to go to ER over weekend. She can restart coumadin since INR low and Hgb stable. Daughter understands plan.   40 min pt care time >50% face to face   I personally performed the service, non-incident to. (WP)  Laurine Blazer, [facility]

## 2019-08-28 NOTE — Telephone Encounter (Signed)
Called patient to come in for office visit at 10:45 - patient stated she is still sleeping and can't come in at that time

## 2019-09-03 ENCOUNTER — Encounter: Payer: Self-pay | Admitting: Family Medicine

## 2019-09-03 ENCOUNTER — Other Ambulatory Visit (INDEPENDENT_AMBULATORY_CARE_PROVIDER_SITE_OTHER): Payer: Self-pay | Admitting: *Deleted

## 2019-09-03 DIAGNOSIS — R197 Diarrhea, unspecified: Secondary | ICD-10-CM | POA: Diagnosis not present

## 2019-09-03 DIAGNOSIS — R944 Abnormal results of kidney function studies: Secondary | ICD-10-CM | POA: Diagnosis not present

## 2019-09-03 LAB — BASIC METABOLIC PANEL
BUN/Creatinine Ratio: 10 (calc) (ref 6–22)
BUN: 12 mg/dL (ref 7–25)
CO2: 25 mmol/L (ref 20–32)
Calcium: 9 mg/dL (ref 8.6–10.4)
Chloride: 104 mmol/L (ref 98–110)
Creat: 1.17 mg/dL — ABNORMAL HIGH (ref 0.60–0.93)
Glucose, Bld: 135 mg/dL (ref 65–139)
Potassium: 4.5 mmol/L (ref 3.5–5.3)
Sodium: 138 mmol/L (ref 135–146)

## 2019-09-04 NOTE — Telephone Encounter (Signed)
Nurses Please advise patient to stop Fosamax for now I do not feel the Fosamax is causing blood in the stool This needs to be thoroughly investigated by gastroenterology I would recommend we let all of these other issues calm down then we can discuss with patient whether or not she wants to restart on Fosamax (which I doubt is causing her bloody stools) or go with a different medication such as a infusion or injection  So therefore the following #1 notify patient #2 discontinue Fosamax #3 set up patient for an in person or virtual visit in 2  months to discuss her osteoporosis options

## 2019-09-06 ENCOUNTER — Other Ambulatory Visit (INDEPENDENT_AMBULATORY_CARE_PROVIDER_SITE_OTHER): Payer: Self-pay | Admitting: Gastroenterology

## 2019-09-10 ENCOUNTER — Other Ambulatory Visit: Payer: Self-pay | Admitting: Cardiology

## 2019-09-10 ENCOUNTER — Telehealth (INDEPENDENT_AMBULATORY_CARE_PROVIDER_SITE_OTHER): Payer: Self-pay | Admitting: *Deleted

## 2019-09-10 ENCOUNTER — Other Ambulatory Visit (INDEPENDENT_AMBULATORY_CARE_PROVIDER_SITE_OTHER): Payer: Self-pay | Admitting: *Deleted

## 2019-09-10 ENCOUNTER — Encounter (INDEPENDENT_AMBULATORY_CARE_PROVIDER_SITE_OTHER): Payer: Self-pay | Admitting: *Deleted

## 2019-09-10 DIAGNOSIS — K625 Hemorrhage of anus and rectum: Secondary | ICD-10-CM

## 2019-09-10 MED ORDER — PLENVU 140 G PO SOLR: 1.0000 | Freq: Once | ORAL | 0 refills | Status: AC

## 2019-09-10 NOTE — Telephone Encounter (Signed)
Patient is scheduled for colonoscopy 10/18/19, she needs to stop coumadin 5 days prior, please advise if ok to stop, thanks

## 2019-09-10 NOTE — Telephone Encounter (Signed)
Patient aware.

## 2019-09-10 NOTE — Telephone Encounter (Signed)
OK to hold warfarin 5 days prior to procedure and resume night of procedure if ok with MD.

## 2019-09-10 NOTE — Telephone Encounter (Signed)
Patient needs Plenvu (copay card) ° °

## 2019-09-11 ENCOUNTER — Other Ambulatory Visit: Payer: Self-pay

## 2019-09-11 ENCOUNTER — Ambulatory Visit (INDEPENDENT_AMBULATORY_CARE_PROVIDER_SITE_OTHER): Payer: Medicare Other | Admitting: Pharmacist

## 2019-09-11 DIAGNOSIS — I82621 Acute embolism and thrombosis of deep veins of right upper extremity: Secondary | ICD-10-CM | POA: Diagnosis not present

## 2019-09-11 DIAGNOSIS — Z5181 Encounter for therapeutic drug level monitoring: Secondary | ICD-10-CM | POA: Diagnosis not present

## 2019-09-11 LAB — POCT INR: INR: 2.2 (ref 2.0–3.0)

## 2019-09-11 NOTE — Patient Instructions (Signed)
Description   Continue taking 1/2 tablet daily except 1 tablet on Mondays, Wednesdays and Fridays Recheck INR in 4 weeks

## 2019-10-07 ENCOUNTER — Other Ambulatory Visit: Payer: Self-pay | Admitting: Family Medicine

## 2019-10-08 ENCOUNTER — Encounter: Payer: Self-pay | Admitting: Cardiology

## 2019-10-08 ENCOUNTER — Ambulatory Visit (INDEPENDENT_AMBULATORY_CARE_PROVIDER_SITE_OTHER): Payer: Medicare Other | Admitting: Cardiology

## 2019-10-08 ENCOUNTER — Other Ambulatory Visit: Payer: Self-pay

## 2019-10-08 ENCOUNTER — Encounter (INDEPENDENT_AMBULATORY_CARE_PROVIDER_SITE_OTHER): Payer: Self-pay | Admitting: *Deleted

## 2019-10-08 ENCOUNTER — Ambulatory Visit (INDEPENDENT_AMBULATORY_CARE_PROVIDER_SITE_OTHER): Payer: Medicare Other | Admitting: *Deleted

## 2019-10-08 VITALS — BP 138/80 | HR 74 | Ht 60.0 in | Wt 176.6 lb

## 2019-10-08 DIAGNOSIS — R0600 Dyspnea, unspecified: Secondary | ICD-10-CM | POA: Diagnosis not present

## 2019-10-08 DIAGNOSIS — E782 Mixed hyperlipidemia: Secondary | ICD-10-CM | POA: Diagnosis not present

## 2019-10-08 DIAGNOSIS — Z5181 Encounter for therapeutic drug level monitoring: Secondary | ICD-10-CM

## 2019-10-08 DIAGNOSIS — I742 Embolism and thrombosis of arteries of the upper extremities: Secondary | ICD-10-CM | POA: Diagnosis not present

## 2019-10-08 DIAGNOSIS — I82621 Acute embolism and thrombosis of deep veins of right upper extremity: Secondary | ICD-10-CM

## 2019-10-08 DIAGNOSIS — R0609 Other forms of dyspnea: Secondary | ICD-10-CM

## 2019-10-08 LAB — POCT INR: INR: 3.4 — AB (ref 2.0–3.0)

## 2019-10-08 NOTE — Telephone Encounter (Signed)
May have 90-day to follow-up in September/October

## 2019-10-08 NOTE — Patient Instructions (Signed)
Hold warfarin tonight then resume 1/2 tablet daily except 1 tablet on Mondays, Wednesdays and Fridays.  Scheduled for colonoscopy on 10/18/19.  Will hold warfarin 5 days before procedure and resume night of procedure if ok with MD.  Take last dose of warfarin on Saturday 10/12/19. Recheck INR 2-3 weeks after procedure.

## 2019-10-08 NOTE — Progress Notes (Signed)
Clinical Summary Jacqueline Morgan is a 79 y.o.female seen today for follow up of the following medical problems.   1. 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  - underwent left arm thromboembolectomy 12/19/12 by Dr Jacqueline Morgan  - echo was negative for thrombus. There was some question of possible aortic arch atherosclerosis. Discharged on coumadin. - denies any bleeding issues on coumadin. Notes some occsaional pain in left arm with activity that is unchanged..  - has not been in favor of NOACs   - denies any bleeding on coumadin   2. Hyperlipidemia 10/2017 TC 368 TG 374 HDL 36 LDL 257 9 /2020 TC 372 TG 392 LDL 248 - had tried crestor 5mg  every other daya few years ago, did not tolerate due to muscle aches.. She reports side effects on lipitorin the past. She is unsure of her other statin histoyr.  -. She reports palpitations on pravastatinand she stopped taking.  - taking crestor twice a week, tolerating - she reports muscle aches on zetia, stopped taking - muscle aches on crestor twice a week - remains resistant to pcsk9 inhibitors  3. DOE  - recent symptoms with short distances - chronic cough, wheezing she reports 2017 PFTs normal CXR no acute process BNP 34   08/2019 echo LVEF 60-65%, grade I DDx, normal RV 07/2019 CXR no acute process SOB singing in church, showering.    SH: sister passed awayNormal Jacqueline Morgan, who was also a patientof mine.   Past Medical History:  Diagnosis Date  . Anxiety   . Depression   . Fibromyalgia   . GERD (gastroesophageal reflux disease)   . Hyperlipidemia   . Hypertension   . Palpitations   . 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 Codeine     REACTION: Adverse gastrointestinal effects  . Statins     Felt bad     Current Outpatient Medications  Medication Sig Dispense Refill  . acetaminophen  (TYLENOL) 500 MG tablet Take 1,000 mg by mouth every 8 (eight) hours as needed for moderate pain. For headache/pain     . LORazepam (ATIVAN) 1 MG tablet TAKE (1/2) TO (1) TABLET TWICE DAILY AS NEEDED. (Patient taking differently: Take 0.5-1 mg by mouth 2 (two) times daily as needed for anxiety. TAKE (1/2) TO (1) TABLET TWICE DAILY AS NEEDED.) 60 tablet 4  . metoprolol succinate (TOPROL-XL) 25 MG 24 hr tablet TAKE 1 TABLET BY MOUTH  DAILY WITH OR IMMEDIATELY  FOLLOWING A MEAL (Patient taking differently: Take 25 mg by mouth daily. TAKE 1 TABLET BY MOUTH  DAILY WITH OR IMMEDIATELY  FOLLOWING A MEAL) 90 tablet 1  . mirtazapine (REMERON) 7.5 MG tablet Take 1 tablet (7.5 mg total) by mouth at bedtime. 30 tablet 5  . pantoprazole (PROTONIX) 40 MG tablet Take 1 tablet (40 mg total) by mouth daily. 180 tablet 1  . PARoxetine (PAXIL) 20 MG tablet TAKE 1 TABLET BID (Patient taking differently: Take 20 mg by mouth in the morning and at bedtime. TAKE 1 TABLET BID) 180 tablet 1  . rosuvastatin (CRESTOR) 5 MG tablet Take one tablet by mouth on Mondays and tablet po on Fridays (Patient not taking: Reported on 08/28/2019) 8 tablet 5  . sucralfate (CARAFATE) 1 g tablet TAKE 1 TABLET BY MOUTH BEFORE MEALS AND AT BEDTIME. (Patient taking differently: Take 1 g by mouth 3 (three) times daily  as needed (acid reflux). ) 120 tablet 5  . warfarin (COUMADIN) 5 MG tablet TAKE 1/2 -1 TABLET BY MOUTH DAILY AS DIRECTED. (Patient taking differently: Take 2.5-5 mg by mouth See admin instructions. TAKE 1/2 -1 TABLET BY MOUTH DAILY AS DIRECTED.  5 mg on Mon, Wed, Fri, and 2.5 all other days) 30 tablet 6   No current facility-administered medications for this visit.     Past Surgical History:  Procedure Laterality Date  . COLONOSCOPY N/A 04/01/2015   Procedure: COLONOSCOPY;  Surgeon: Jacqueline Houston, MD;  [facility];  Service: Endoscopy;  Laterality: N/A;  1:45  . EMBOLECTOMY Left 12/19/2012   Procedure: EMBOLECTOMY OF  LEFT BRACHIAL ARTERY;  Surgeon: Jacqueline [place], MD;  Location: [facility];  Service: Vascular;  Laterality: Left;  . EYE SURGERY       Allergies  Allergen Reactions  . Codeine     REACTION: Adverse gastrointestinal effects  . Statins     Felt bad      Family History  Problem Relation Age of Onset  . Cancer Mother        ovarian, colon  . Heart disease Mother   . Heart attack Father   . Glaucoma Father   . COPD Sister   . Benign prostatic hyperplasia Brother      Social History [name] reports that she has never smoked. She has never used smokeless tobacco. [name] reports no history of alcohol use.   Review of Systems CONSTITUTIONAL: No weight loss, fever, chills, weakness or fatigue.  HEENT: Eyes: No visual loss, blurred vision, double vision or yellow sclerae.No hearing loss, sneezing, congestion, runny nose or sore throat.  SKIN: No rash or itching.  CARDIOVASCULAR: per hpi RESPIRATORY: No shortness of breath, cough or sputum.  GASTROINTESTINAL: No anorexia, nausea, vomiting or diarrhea. No abdominal pain or blood.  GENITOURINARY: No burning on urination, no polyuria NEUROLOGICAL: No headache, dizziness, syncope, paralysis, ataxia, numbness or tingling in the extremities. No change in bowel or bladder control.  MUSCULOSKELETAL: No muscle, back pain, joint pain or stiffness.  LYMPHATICS: No enlarged nodes. No history of splenectomy.  PSYCHIATRIC: No history of depression or anxiety.  ENDOCRINOLOGIC: No reports of sweating, cold or heat intolerance. No polyuria or polydipsia.  [name]   Physical Examination Today's Vitals   10/08/19 1507  BP: 138/80  Pulse: 74  SpO2: 92%  Weight: 176 lb 9.6 oz (80.1 kg)  Height: 5' (1.524 m)   Body mass index is 34.49 kg/m.  Gen: resting comfortably, no acute distress HEENT: no scleral icterus, pupils equal round and reactive, no palptable cervical adenopathy,  CV: RRR, no m/rg, no jvd Resp: Clear to auscultation  bilaterally GI: abdomen is soft, non-tender, non-distended, normal bowel sounds, no hepatosplenomegaly MSK: extremities are warm, no edema.  Skin: warm, no rash Neuro:  no focal deficits Psych: appropriate affect   Diagnostic Studies 05/2013 21 day Event monitor No arrythmias  07/2015 echo Study Conclusions  - Left ventricle: The cavity size was normal. Wall thickness was  increased in a pattern of mild LVH. Systolic function was normal.  The estimated ejection fraction was in the range of 60% to 65%.  Wall motion was normal; there were no regional wall motion  abnormalities. Doppler parameters are consistent with abnormal  left ventricular relaxation (grade 1 diastolic dysfunction). - Aortic valve: Mildly calcified annulus. Trileaflet; mildly  thickened leaflets. Valve area (VTI): 2.21 cm^2. Valve area  (Vmax): 2.35 cm^2. - Mitral valve:  Mildly calcified annulus. Mildly thickened leaflets  . - Technically adequate study.  07/2015 LUE Arterial US IMPRESSION: Left upper extremity arterial noninvasive study demonstrates no evidence of significant arterial occlusive disease.  10/2015 Lexiscan MPI  There was no ST segment deviation noted during stress.  The study is normal. There are no perfusion defects consistent with prior infarct or current ischemia.  This is a low risk study.  The left ventricular ejection fraction is normal (55-65%).  08/2019 echo IMPRESSIONS    1. Left ventricular ejection fraction, by estimation, is 60 to 65%. The  left ventricle has normal function. Left ventricular endocardial border  not optimally defined to evaluate regional wall motion. There is mild left  ventricular hypertrophy of the  posterior segment. Left ventricular diastolic parameters are consistent  with Grade I diastolic dysfunction (impaired relaxation).  2. Right ventricular systolic function is normal. The right ventricular  size is normal. There is normal  pulmonary artery systolic pressure.  3. The mitral valve is degenerative. Trivial mitral valve regurgitation.  4. The aortic valve is tricuspid. Aortic valve regurgitation is not  visualized. No aortic stenosis is present.   Assessment and Plan  1. Left brachial artery occlusion  - secondary to thromboembolism, unclear source of the embolism (cardiac vs aortic atheroscerlosis)  -continue anticoag, she has not been interested in NOACs, continue coumadin.    2. Hyperlipidemia -side effects on statins and zetia - continues to turn down pcsk9 inhibitors  3. DOE - extensive cardaic testing over the years has been overall benign - defer to pcp whether to pursue any repeat pulmonary testing. I thnk deconditioning is certalintly playing at least some role   F/u 6 months   Arnoldo Lenis, M.D.

## 2019-10-08 NOTE — Telephone Encounter (Signed)
06/26/19 was last med check up

## 2019-10-08 NOTE — Patient Instructions (Signed)

## 2019-10-09 NOTE — Telephone Encounter (Signed)
Scheduled 8/10

## 2019-10-09 NOTE — Telephone Encounter (Signed)
Please schedule follow up and then route back

## 2019-10-16 ENCOUNTER — Other Ambulatory Visit: Payer: Self-pay

## 2019-10-16 ENCOUNTER — Encounter (HOSPITAL_COMMUNITY): Payer: Self-pay

## 2019-10-16 ENCOUNTER — Other Ambulatory Visit (HOSPITAL_COMMUNITY)
Admission: RE | Admit: 2019-10-16 | Discharge: 2019-10-16 | Disposition: A | Discharge status: Home / Self Care | Payer: Medicare Other | Source: Ambulatory Visit | Attending: Internal Medicine | Admitting: Internal Medicine

## 2019-10-16 ENCOUNTER — Encounter (HOSPITAL_COMMUNITY)
Admission: RE | Admit: 2019-10-16 | Discharge: 2019-10-16 | Disposition: A | Discharge status: Home / Self Care | Payer: Medicare Other | Source: Ambulatory Visit | Attending: Internal Medicine | Admitting: Internal Medicine

## 2019-10-16 DIAGNOSIS — Z01812 Encounter for preprocedural laboratory examination: Secondary | ICD-10-CM | POA: Diagnosis not present

## 2019-10-16 DIAGNOSIS — Z20822 Contact with and (suspected) exposure to covid-19: Secondary | ICD-10-CM | POA: Insufficient documentation

## 2019-10-16 DIAGNOSIS — K625 Hemorrhage of anus and rectum: Secondary | ICD-10-CM

## 2019-10-16 LAB — SARS CORONAVIRUS 2 (TAT 6-24 HRS): SARS Coronavirus 2: NEGATIVE

## 2019-10-18 ENCOUNTER — Ambulatory Visit (HOSPITAL_COMMUNITY): Payer: Medicare Other | Admitting: Certified Registered"

## 2019-10-18 ENCOUNTER — Encounter (HOSPITAL_COMMUNITY)
Admission: RE | Disposition: A | Discharge status: Home / Self Care | Payer: Self-pay | Source: Home / Self Care | Attending: Internal Medicine

## 2019-10-18 ENCOUNTER — Other Ambulatory Visit: Payer: Self-pay

## 2019-10-18 ENCOUNTER — Encounter (HOSPITAL_COMMUNITY): Payer: Self-pay | Admitting: Internal Medicine

## 2019-10-18 ENCOUNTER — Ambulatory Visit (HOSPITAL_COMMUNITY)
Admission: RE | Admit: 2019-10-18 | Discharge: 2019-10-18 | Disposition: A | Discharge status: Home / Self Care | Payer: Medicare Other | Attending: Internal Medicine | Admitting: Internal Medicine

## 2019-10-18 DIAGNOSIS — K5909 Other constipation: Secondary | ICD-10-CM | POA: Diagnosis not present

## 2019-10-18 DIAGNOSIS — Z79899 Other long term (current) drug therapy: Secondary | ICD-10-CM | POA: Diagnosis not present

## 2019-10-18 DIAGNOSIS — Z86718 Personal history of other venous thrombosis and embolism: Secondary | ICD-10-CM | POA: Insufficient documentation

## 2019-10-18 DIAGNOSIS — E785 Hyperlipidemia, unspecified: Secondary | ICD-10-CM | POA: Insufficient documentation

## 2019-10-18 DIAGNOSIS — D123 Benign neoplasm of transverse colon: Secondary | ICD-10-CM | POA: Diagnosis not present

## 2019-10-18 DIAGNOSIS — Z7901 Long term (current) use of anticoagulants: Secondary | ICD-10-CM | POA: Insufficient documentation

## 2019-10-18 DIAGNOSIS — K644 Residual hemorrhoidal skin tags: Secondary | ICD-10-CM | POA: Diagnosis not present

## 2019-10-18 DIAGNOSIS — K219 Gastro-esophageal reflux disease without esophagitis: Secondary | ICD-10-CM | POA: Diagnosis not present

## 2019-10-18 DIAGNOSIS — Z20822 Contact with and (suspected) exposure to covid-19: Secondary | ICD-10-CM | POA: Insufficient documentation

## 2019-10-18 DIAGNOSIS — K5730 Diverticulosis of large intestine without perforation or abscess without bleeding: Secondary | ICD-10-CM | POA: Insufficient documentation

## 2019-10-18 DIAGNOSIS — Z800 Family history of malignant neoplasm of digestive organs: Secondary | ICD-10-CM | POA: Insufficient documentation

## 2019-10-18 DIAGNOSIS — F329 Major depressive disorder, single episode, unspecified: Secondary | ICD-10-CM | POA: Insufficient documentation

## 2019-10-18 DIAGNOSIS — Z8249 Family history of ischemic heart disease and other diseases of the circulatory system: Secondary | ICD-10-CM | POA: Insufficient documentation

## 2019-10-18 DIAGNOSIS — R197 Diarrhea, unspecified: Secondary | ICD-10-CM

## 2019-10-18 DIAGNOSIS — I10 Essential (primary) hypertension: Secondary | ICD-10-CM | POA: Diagnosis not present

## 2019-10-18 DIAGNOSIS — K635 Polyp of colon: Secondary | ICD-10-CM | POA: Diagnosis not present

## 2019-10-18 DIAGNOSIS — M797 Fibromyalgia: Secondary | ICD-10-CM | POA: Diagnosis not present

## 2019-10-18 DIAGNOSIS — Z86010 Personal history of colonic polyps: Secondary | ICD-10-CM | POA: Diagnosis not present

## 2019-10-18 DIAGNOSIS — F419 Anxiety disorder, unspecified: Secondary | ICD-10-CM | POA: Diagnosis not present

## 2019-10-18 DIAGNOSIS — K625 Hemorrhage of anus and rectum: Secondary | ICD-10-CM | POA: Insufficient documentation

## 2019-10-18 HISTORY — PX: POLYPECTOMY: SHX5525

## 2019-10-18 HISTORY — PX: COLONOSCOPY WITH PROPOFOL: SHX5780

## 2019-10-18 HISTORY — DX: Other complications of anesthesia, initial encounter: T88.59XA

## 2019-10-18 SURGERY — COLONOSCOPY WITH PROPOFOL
Anesthesia: General

## 2019-10-18 MED ORDER — LIDOCAINE HCL (CARDIAC) PF 50 MG/5ML IV SOSY
PREFILLED_SYRINGE | INTRAVENOUS | Status: DC | PRN
  Administered 2019-10-18: 100 mg via INTRAVENOUS

## 2019-10-18 MED ORDER — PROPOFOL 500 MG/50ML IV EMUL
INTRAVENOUS | Status: DC | PRN
  Administered 2019-10-18: 80 ug/kg/min via INTRAVENOUS

## 2019-10-18 MED ORDER — LACTATED RINGERS IV SOLN
INTRAVENOUS | Status: DC
  Administered 2019-10-18: 1000 mL via INTRAVENOUS

## 2019-10-18 MED ORDER — PROPOFOL 10 MG/ML IV BOLUS
INTRAVENOUS | Status: AC
  Filled 2019-10-18: qty 80

## 2019-10-18 MED ORDER — SODIUM CHLORIDE 0.9 % IV SOLN: INTRAVENOUS | Status: DC

## 2019-10-18 MED ORDER — EPHEDRINE SULFATE 50 MG/ML IJ SOLN
INTRAMUSCULAR | Status: DC | PRN
  Administered 2019-10-18: 5 mg via INTRAVENOUS

## 2019-10-18 MED ORDER — CHLORHEXIDINE GLUCONATE CLOTH 2 % EX PADS: 6.0000 | MEDICATED_PAD | Freq: Once | CUTANEOUS | Status: DC

## 2019-10-18 MED ORDER — PROPOFOL 10 MG/ML IV BOLUS
INTRAVENOUS | Status: AC
  Filled 2019-10-18: qty 100

## 2019-10-18 MED ORDER — LIDOCAINE 2% (20 MG/ML) 5 ML SYRINGE
INTRAMUSCULAR | Status: AC
  Filled 2019-10-18: qty 20

## 2019-10-18 NOTE — Anesthesia Preprocedure Evaluation (Signed)
Anesthesia Evaluation  Patient identified by MRN, date of birth, ID band Patient awake    Reviewed: Allergy & Precautions, H&P , NPO status , Patient's Chart, lab work & pertinent test results, reviewed documented beta blocker date and time   Airway Mallampati: II  TM Distance: >3 FB Neck ROM: full    Dental no notable dental hx. (+) Teeth Intact   Pulmonary neg pulmonary ROS,    Pulmonary exam normal breath sounds clear to auscultation       Cardiovascular Exercise Tolerance: Good hypertension, + Peripheral Vascular Disease   Rhythm:regular Rate:Normal     Neuro/Psych PSYCHIATRIC DISORDERS Anxiety Depression  Neuromuscular disease    GI/Hepatic Neg liver ROS, GERD  Medicated,  Endo/Other  negative endocrine ROS  Renal/GU negative Renal ROS  negative genitourinary   Musculoskeletal   Abdominal   Peds  Hematology negative hematology ROS (+)   Anesthesia Other Findings   Reproductive/Obstetrics negative OB ROS                             Anesthesia Physical Anesthesia Plan  ASA: III  Anesthesia Plan: General   Post-op Pain Management:    Induction:   PONV Risk Score and Plan: Propofol infusion  Airway Management Planned:   Additional Equipment:   Intra-op Plan:   Post-operative Plan:   Informed Consent: I have reviewed the patients History and Physical, chart, labs and discussed the procedure including the risks, benefits and alternatives for the proposed anesthesia with the patient or authorized representative who has indicated his/her understanding and acceptance.     Dental Advisory Given  Plan Discussed with: CRNA  Anesthesia Plan Comments:         Anesthesia Quick Evaluation

## 2019-10-18 NOTE — Discharge Instructions (Signed)
Resume warfarin today at usual dose.  INR check in 7 to 10 days. Resume other medications as before. Colace 200 mg daily by mouth at bedtime. Can use MiraLAX half scoop daily or every other day if Colace alone does not work. High-fiber diet. No driving for 24 hours. Physician will call with biopsy results.   Colonoscopy, Adult, Care After This sheet gives you information about how to care for yourself after your procedure. Your doctor may also give you more specific instructions. If you have problems or questions, call your doctor. What can I expect after the procedure? After the procedure, it is common to have:  A small amount of blood in your poop (stool) for 24 hours.  Some gas.  Mild cramping or bloating in your belly (abdomen). Follow these instructions at home: Eating and drinking   Drink enough fluid to keep your pee (urine) pale yellow.  Follow instructions from your doctor about what you cannot eat or drink.  Return to your normal diet as told by your doctor. Avoid heavy or fried foods that are hard to digest. Activity  Rest as told by your doctor.  Do not sit for a long time without moving. Get up to take short walks every 1-2 hours. This is important. Ask for help if you feel weak or unsteady.  Return to your normal activities as told by your doctor. Ask your doctor what activities are safe for you. To help cramping and bloating:   Try walking around.  Put heat on your belly as told by your doctor. Use the heat source that your doctor recommends, such as a moist heat pack or a heating pad. ? Put a towel between your skin and the heat source. ? Leave the heat on for 20-30 minutes. ? Remove the heat if your skin turns bright red. This is very important if you are unable to feel pain, heat, or cold. You may have a greater risk of getting burned. General instructions  For the first 24 hours after the procedure: ? Do not drive or use machinery. ? Do not sign  important documents. ? Do not drink alcohol. ? Do your daily activities more slowly than normal. ? Eat foods that are soft and easy to digest.  Take over-the-counter or prescription medicines only as told by your doctor.  Keep all follow-up visits as told by your doctor. This is important. Contact a doctor if:  You have blood in your poop 2-3 days after the procedure. Get help right away if:  You have more than a small amount of blood in your poop.  You see large clumps of tissue (blood clots) in your poop.  Your belly is swollen.  You feel like you may vomit (nauseous).  You vomit.  You have a fever.  You have belly pain that gets worse, and medicine does not help your pain. Summary  After the procedure, it is common to have a small amount of blood in your poop. You may also have mild cramping and bloating in your belly.  For the first 24 hours after the procedure, do not drive or use machinery, do not sign important documents, and do not drink alcohol.  Get help right away if you have a lot of blood in your poop, feel like you may vomit, have a fever, or have more belly pain. This information is not intended to replace advice given to you by your health care provider. Make sure you discuss any questions you have  with your health care provider. Document Revised: 10/15/2018 Document Reviewed: 10/15/2018 Elsevier Patient Education  [place].

## 2019-10-18 NOTE — H&P (Signed)
Jacqueline Morgan is an 79 y.o. female.   Chief Complaint: Patient is here for colonoscopy. HPI: Patient is 79 year old Caucasian female who has history of multiple colonic adenomas with last colonoscopy was in December 2016 who was seen in the office about 7 weeks ago with history of diarrhea and rectal bleeding.  She did not experience fever chills nausea or vomiting.  No history of prior antibiotic use.  Her hemoglobin when she was seen in the office was 13.  Patient says she has not had any more bleeding episodes.  She has chronic constipation.  She goes once a week.  She takes stool softener on as-needed basis.  She has taken MiraLAX in the past but did not feel it worked well.  She has history of DVT.  Patient has been off warfarin for 5 days. Family history significant for colon carcinoma in her mother who was 57 at the time of diagnosis and she had surgery with colostomy and is now 79 years old.  Past Medical History:  Diagnosis Date  . Anxiety   . Complication of anesthesia    passed out after dental surgery  . Depression   . Fibromyalgia   . GERD (gastroesophageal reflux disease)   . Hyperlipidemia   . Hypertension   . Palpitations   . 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 COLONOSCOPY N/A 04/01/2015   Procedure: COLONOSCOPY;  Surgeon: Rogene Houston, MD;  [facility];  Service: Endoscopy;  Laterality: N/A;  1:45  . EMBOLECTOMY Left 12/19/2012   Procedure: EMBOLECTOMY OF LEFT BRACHIAL ARTERY;  Surgeon: Conrad N[place]ap[place], MD;  Location: [place] [facility];  Service: Vascular;  Laterality: Left;  . EYE SURGERY      Family History  Problem Relation Age of Onset  . Cancer Mother        ovarian, colon  . Heart disease Mother   . Heart attack Father   . Glaucoma Father   . COPD Sister   . Benign prostatic hyperplasia Brother    Social History:  reports that she has never smoked. She has never used smokeless tobacco. She reports that she  does not drink alcohol and does not use drugs.  Allergies:  Allergies  Allergen Reactions  . Codeine     REACTION: Adverse gastrointestinal effects  . Statins     Felt bad    Medications Prior to Admission  Medication Sig Dispense Refill  . acetaminophen (TYLENOL) 500 MG tablet Take 1,000 mg by mouth every 8 (eight) hours as needed for moderate pain. For headache/pain     . LORazepam (ATIVAN) 1 MG tablet TAKE (1/2) TO (1) TABLET TWICE DAILY AS NEEDED. (Patient taking differently: Take 0.5-1 mg by mouth 2 (two) times daily as needed for anxiety. TAKE (1/2) TO (1) TABLET TWICE DAILY AS NEEDED.) 60 tablet 4  . metoprolol succinate (TOPROL-XL) 25 MG 24 hr tablet TAKE 1 TABLET BY MOUTH  DAILY WITH OR IMMEDIATELY  FOLLOWING A MEAL (Patient taking differently: Take 25 mg by mouth daily. TAKE 1 TABLET BY MOUTH  DAILY WITH OR IMMEDIATELY  FOLLOWING A MEAL) 90 tablet 1  . mirtazapine (REMERON) 7.5 MG tablet Take 1 tablet (7.5 mg total) by mouth at bedtime. 30 tablet 5  . pantoprazole (PROTONIX) 40 MG tablet Take 1 tablet (40 mg total) by mouth daily. 180 tablet 1  . PARoxetine (PAXIL) 20 MG tablet TAKE 1 TABLET BY MOUTH IN  THE MORNING AND  2 TABLETS  BY MOUTH IN THE EVENING 270 tablet 0  . sucralfate (CARAFATE) 1 g tablet TAKE 1 TABLET BY MOUTH BEFORE MEALS AND AT BEDTIME. (Patient taking differently: Take 1 g by mouth 3 (three) times daily as needed (acid reflux). ) 120 tablet 5  . warfarin (COUMADIN) 5 MG tablet TAKE 1/2 -1 TABLET BY MOUTH DAILY AS DIRECTED. (Patient taking differently: Take 2.5-5 mg by mouth See admin instructions. TAKE 1/2 -1 TABLET BY MOUTH DAILY AS DIRECTED.  5 mg on Mon, Wed, Fri, and 2.5 all other days) 30 tablet 6    Results for orders placed or performed during the hospital encounter of 10/16/19 (from the past 48 hour(s))  SARS CORONAVIRUS 2 (TAT 6-24 HRS) Nasopharyngeal Nasopharyngeal Swab     Status: None   Collection Time: 10/16/19 10:34 AM   Specimen: Nasopharyngeal  Swab  Result Value Ref Range   SARS Coronavirus 2 NEGATIVE NEGATIVE    Comment: (NOTE) SARS-CoV-2 target nucleic acids are NOT DETECTED.  The SARS-CoV-2 RNA is generally detectable in upper and lower respiratory specimens during the acute phase of infection. Negative results do not preclude SARS-CoV-2 infection, do not rule out co-infections with other pathogens, and should not be used as the sole basis for treatment or other patient management decisions. Negative results must be combined with clinical observations, patient history, and epidemiological information. The expected result is Negative.  Fact Sheet for Patients: SugarRoll.be  Fact Sheet for Healthcare Providers: https://www.woods-mathews.com/  This test is not yet approved or cleared by the [place] FDA and  has been authorized for detection and/or diagnosis of SARS-CoV-2 by FDA under an Emergency Use Authorization (EUA). This EUA will remain  in effect (meaning this test can be used) for the duration of the COVID-19 declaration under Se ction 564(b)(1) of the Act, 21 U.S.C. section 360bbb-3(b)(1), unless the authorization is terminated or revoked sooner.  Performed at [facility], [place] [address] [place], [place] [address]    No results found.  Review of Systems  Blood pressure (!) 150/61, pulse 94, temperature 98.1 F (36.7 C), temperature source Oral, resp. rate 18, height 5' (1.524 m), weight 80.1 kg, SpO2 96 %. Physical Exam Eyes:     General: No scleral icterus.    Conjunctiva/sclera: Conjunctivae normal.  Cardiovascular:     Rate and Rhythm: Normal rate and regular rhythm.     Heart sounds: Normal heart sounds. No murmur heard.   Pulmonary:     Effort: Pulmonary effort is normal.     Breath sounds: Normal breath sounds.  Abdominal:     Comments: Abdomen is full but soft and nontender with organomegaly or masses.  Musculoskeletal:        General:  No swelling.     Cervical back: Neck supple.  Lymphadenopathy:     Cervical: No cervical adenopathy.  Skin:    General: Skin is warm and dry.  Neurological:     Mental Status: She is alert.     Comments: She has had tremor.      Assessment/Plan Rectal bleeding. History of colonic adenomas. Diagnostic colonoscopy.   Hildred Laser, MD 10/18/2019, 7:22 AM

## 2019-10-18 NOTE — Transfer of Care (Signed)
Immediate Anesthesia Transfer of Care Note  Patient: Jacqueline Morgan  Procedure(s) Performed: COLONOSCOPY WITH PROPOFOL (N/A ) POLYPECTOMY  Patient Location: PACU  Anesthesia Type:General  Level of Consciousness: awake, alert , oriented and patient cooperative  Airway & Oxygen Therapy: Patient Spontanous Breathing and Patient connected to nasal cannula oxygen  Post-op Assessment: Report given to RN, Post -op Vital signs reviewed and stable and Patient moving all extremities  Post vital signs: Reviewed and stable  Last Vitals:  Vitals Value Taken Time  BP 110/50 10/18/19 0752  Temp    Pulse 85 10/18/19 0752  Resp 19 10/18/19 0752  SpO2 94 % 10/18/19 0752  Vitals shown include unvalidated device data.  Last Pain:  Vitals:   10/18/19 0726  TempSrc:   PainSc: 0-No pain      Patients Stated Pain Goal: 5 (76/14/70 9295)  Complications: No complications documented.

## 2019-10-18 NOTE — Anesthesia Postprocedure Evaluation (Signed)
Anesthesia Post Note  Patient: Jacqueline Morgan  Procedure(s) Performed: COLONOSCOPY WITH PROPOFOL (N/A ) POLYPECTOMY  Patient location during evaluation: PACU Anesthesia Type: General Level of consciousness: awake, oriented, awake and alert and patient cooperative Pain management: pain level controlled Vital Signs Assessment: post-procedure vital signs reviewed and stable Respiratory status: spontaneous breathing, respiratory function stable, patient connected to nasal cannula oxygen and nonlabored ventilation Cardiovascular status: stable and blood pressure returned to baseline Postop Assessment: no headache and no backache Anesthetic complications: no   No complications documented.   Last Vitals:  Vitals:   10/18/19 0632  BP: (!) 150/61  Pulse: 94  Resp: 18  Temp: 36.7 C  SpO2: 96%    Last Pain:  Vitals:   10/18/19 0726  TempSrc:   PainSc: 0-No pain                 Tacy Learn

## 2019-10-18 NOTE — Op Note (Signed)
[facility] Patient Name: Jacqueline Morgan Procedure Date: 10/18/2019 7:12 AM MRN: [id number] Date of Birth: 01/09/1941 Attending MD: Hildred Laser , MD CSN: [id number] Age: 79 Admit Type: Outpatient Procedure:                Colonoscopy Indications:              Rectal bleeding Providers:                Hildred Laser, MD, Crystal Page, Randa Spike,                            Technician Referring MD:             Ralene Bathe. Dondiego, MD Medicines:                Propofol per Anesthesia Complications:            No immediate complications. Estimated Blood Loss:     Estimated blood loss was minimal. Procedure:                Pre-Anesthesia Assessment:                           - Prior to the procedure, a History and Physical                            was performed, and patient medications and                            allergies were reviewed. The patient's tolerance of                            previous anesthesia was also reviewed. The risks                            and benefits of the procedure and the sedation                            options and risks were discussed with the patient.                            All questions were answered, and informed consent                            was obtained. Prior Anticoagulants: The patient                            last took Coumadin (warfarin) 5 days prior to the                            procedure. ASA Grade Assessment: II - A patient                            with mild systemic disease. After reviewing the  risks and benefits, the patient was deemed in                            satisfactory condition to undergo the procedure.                           After obtaining informed consent, the colonoscope                            was passed under direct vision. Throughout the                            procedure, the patient's blood pressure, pulse, and                            oxygen  saturations were monitored continuously. The                            [id number]-[id number] ([id number]) scope was introduced through                            the anus and advanced to the the cecum, identified                            by appendiceal orifice and ileocecal valve. The                            colonoscopy was performed without difficulty. The                            patient tolerated the procedure well. The quality                            of the bowel preparation was adequate. The                            ileocecal valve, appendiceal orifice, and rectum                            were photographed. Scope In: 7:31:40 AM Scope Out: 7:47:57 AM Scope Withdrawal Time: 0 hours 8 minutes 10 seconds  Total Procedure Duration: 0 hours 16 minutes 17 seconds  Findings:      The perianal and digital rectal examinations were normal.      Two polyps were found in the transverse colon. The polyps were 4 to 7 mm       in size. These polyps were removed with a cold snare. Resection and       retrieval were complete. The pathology specimen was placed into Bottle       Number 1.      Multiple small and large-mouthed diverticula were found in the sigmoid       colon.      External hemorrhoids were found during retroflexion. The hemorrhoids       were small. Impression:               -  Two 4 to 7 mm polyps in the transverse colon,                            removed with a cold snare. Resected and retrieved.                           - Diverticulosis in the sigmoid colon.                           - External hemorrhoids. Moderate Sedation:      Per Anesthesia Care Recommendation:           - Patient has a contact number available for                            emergencies. The signs and symptoms of potential                            delayed complications were discussed with the                            patient. Return to normal activities tomorrow.                            Written  discharge instructions were provided to the                            patient.                           - High fiber diet today.                           - Continue present medications.                           - Resume Coumadin (warfarin) at prior dose today.                            Refer to [facility] for further adjustment of                            therapy.                           - Await pathology results.                           - Repeat colonoscopy in 5 years for surveillance. Procedure Code(s):        --- Professional ---                           [phone], Colonoscopy, flexible; with removal of                            tumor(s), polyp(s), or other lesion(s) by snare  technique Diagnosis Code(s):        --- Professional ---                           K63.5, Polyp of colon                           K64.4, Residual hemorrhoidal skin tags                           K62.5, Hemorrhage of anus and rectum                           K57.30, Diverticulosis of large intestine without                            perforation or abscess without bleeding CPT copyright 2019 American Medical Association. All rights reserved. The codes documented in this report are preliminary and upon coder review may  be revised to meet current compliance requirements. Hildred Laser, MD Hildred Laser, MD 10/18/2019 8:01:04 AM This report has been signed electronically. Number of Addenda: 0

## 2019-10-21 LAB — SURGICAL PATHOLOGY

## 2019-10-23 ENCOUNTER — Encounter (HOSPITAL_COMMUNITY): Payer: Self-pay | Admitting: Internal Medicine

## 2019-11-06 ENCOUNTER — Ambulatory Visit (INDEPENDENT_AMBULATORY_CARE_PROVIDER_SITE_OTHER): Payer: Medicare Other | Admitting: *Deleted

## 2019-11-06 DIAGNOSIS — I742 Embolism and thrombosis of arteries of the upper extremities: Secondary | ICD-10-CM

## 2019-11-06 DIAGNOSIS — I82621 Acute embolism and thrombosis of deep veins of right upper extremity: Secondary | ICD-10-CM | POA: Diagnosis not present

## 2019-11-06 DIAGNOSIS — Z5181 Encounter for therapeutic drug level monitoring: Secondary | ICD-10-CM | POA: Diagnosis not present

## 2019-11-06 LAB — POCT INR: INR: 3 (ref 2.0–3.0)

## 2019-11-06 NOTE — Patient Instructions (Signed)
Continue warfarin 1/2 tablet daily except 1 tablet on Mondays, Wednesdays and Fridays.   Recheck INR 6 weeks  

## 2019-11-12 ENCOUNTER — Ambulatory Visit (INDEPENDENT_AMBULATORY_CARE_PROVIDER_SITE_OTHER): Payer: Medicare Other | Admitting: Family Medicine

## 2019-11-12 ENCOUNTER — Encounter: Payer: Self-pay | Admitting: Family Medicine

## 2019-11-12 ENCOUNTER — Other Ambulatory Visit: Payer: Self-pay

## 2019-11-12 VITALS — BP 126/84 | Temp 97.6°F | Ht 60.0 in | Wt 174.2 lb

## 2019-11-12 DIAGNOSIS — M79671 Pain in right foot: Secondary | ICD-10-CM | POA: Diagnosis not present

## 2019-11-12 DIAGNOSIS — R5383 Other fatigue: Secondary | ICD-10-CM | POA: Diagnosis not present

## 2019-11-12 DIAGNOSIS — I10 Essential (primary) hypertension: Secondary | ICD-10-CM | POA: Diagnosis not present

## 2019-11-12 DIAGNOSIS — I742 Embolism and thrombosis of arteries of the upper extremities: Secondary | ICD-10-CM | POA: Diagnosis not present

## 2019-11-12 DIAGNOSIS — M79672 Pain in left foot: Secondary | ICD-10-CM | POA: Diagnosis not present

## 2019-11-12 MED ORDER — LORAZEPAM 1 MG PO TABS: 0.5000 mg | ORAL_TABLET | Freq: Two times a day (BID) | ORAL | 3 refills | Status: DC | PRN

## 2019-11-12 MED ORDER — MIRTAZAPINE 7.5 MG PO TABS: 7.5000 mg | ORAL_TABLET | Freq: Every day | ORAL | 5 refills | Status: DC

## 2019-11-12 MED ORDER — PAROXETINE HCL 20 MG PO TABS: ORAL_TABLET | ORAL | 1 refills | Status: DC

## 2019-11-12 NOTE — Progress Notes (Signed)
   Subjective:    Patient ID: Jacqueline Morgan, female    DOB: 05-Aug-1940, 79 y.o.   MRN: [id number]  Hypertension This is a chronic problem. The current episode started more than 1 year ago. Risk factors for coronary artery disease include dyslipidemia and post-menopausal state. Treatments tried: metoprolol. There are no compliance problems.    Patient is taking her medication on a regular basis.  Sleeping better.  Tolerating medicine.  Denies any setbacks She relates a lot of fatigue and tiredness she denies snoring she states she sleeps okay she tries to eat okay in addition to this she does not do much in the way of walking because she has significant bunion on the left feet as well as potentially developing arthritis She denies any type of chest tightness pressure pain shortness of breath currently denies rectal bleeding   Review of Systems See above    Objective:   Physical Exam Lungs clear respiratory rate normal heart rate controlled extremities no edema skin warm dry neurologic grossly normal patient very nervous with tremor noted       Assessment & Plan:  1. Essential hypertension Blood pressure good control continue current measures - Basic metabolic panel  2. Other fatigue Will check thyroid function - T4, free - TSH  3. Pain in both feet Encourage patient to see podiatry she defers currently  I encourage patient to get Covid vaccine she defers

## 2019-11-13 LAB — BASIC METABOLIC PANEL
BUN/Creatinine Ratio: 11 — ABNORMAL LOW (ref 12–28)
BUN: 12 mg/dL (ref 8–27)
CO2: 23 mmol/L (ref 20–29)
Calcium: 9.3 mg/dL (ref 8.7–10.3)
Chloride: 101 mmol/L (ref 96–106)
Creatinine, Ser: 1.05 mg/dL — ABNORMAL HIGH (ref 0.57–1.00)
GFR calc Af Amer: 59 mL/min/{1.73_m2} — ABNORMAL LOW (ref >59)
GFR calc non Af Amer: 51 mL/min/{1.73_m2} — ABNORMAL LOW (ref >59)
Glucose: 87 mg/dL (ref 65–99)
Potassium: 4.6 mmol/L (ref 3.5–5.2)
Sodium: 137 mmol/L (ref 134–144)

## 2019-11-13 LAB — TSH: TSH: 1.79 u[IU]/mL (ref 0.450–4.500)

## 2019-11-13 LAB — T4, FREE: Free T4: 1.06 ng/dL (ref 0.82–1.77)

## 2019-11-14 ENCOUNTER — Encounter: Payer: Self-pay | Admitting: Family Medicine

## 2019-11-14 DIAGNOSIS — Z1231 Encounter for screening mammogram for malignant neoplasm of breast: Secondary | ICD-10-CM | POA: Diagnosis not present

## 2019-11-18 ENCOUNTER — Other Ambulatory Visit: Payer: Self-pay | Admitting: *Deleted

## 2019-11-18 DIAGNOSIS — E7849 Other hyperlipidemia: Secondary | ICD-10-CM

## 2019-11-18 DIAGNOSIS — R7303 Prediabetes: Secondary | ICD-10-CM

## 2019-11-18 DIAGNOSIS — R5383 Other fatigue: Secondary | ICD-10-CM

## 2019-11-18 DIAGNOSIS — I10 Essential (primary) hypertension: Secondary | ICD-10-CM

## 2019-11-22 ENCOUNTER — Telehealth: Payer: Self-pay | Admitting: Family Medicine

## 2019-11-22 NOTE — Telephone Encounter (Signed)
Patient stated she was aware of the abnormal results and has an appointment for a diagnostic mammo on Thursday.

## 2019-11-22 NOTE — Telephone Encounter (Signed)
Nurses I received a mammogram report dated November 14, 2019 Stated possible abnormal area they are recommending further imaging Please make sure that patient has been contacted by [facility] mammography to do a diagnostic mammo and ultrasound Please document accordingly Initial report being scanned

## 2019-11-28 ENCOUNTER — Encounter: Payer: Self-pay | Admitting: Family Medicine

## 2019-11-28 DIAGNOSIS — R928 Other abnormal and inconclusive findings on diagnostic imaging of breast: Secondary | ICD-10-CM | POA: Diagnosis not present

## 2019-12-14 ENCOUNTER — Other Ambulatory Visit: Payer: Self-pay | Admitting: Family Medicine

## 2019-12-19 ENCOUNTER — Ambulatory Visit (INDEPENDENT_AMBULATORY_CARE_PROVIDER_SITE_OTHER): Payer: Medicare Other | Admitting: *Deleted

## 2019-12-19 ENCOUNTER — Other Ambulatory Visit: Payer: Self-pay

## 2019-12-19 DIAGNOSIS — I82621 Acute embolism and thrombosis of deep veins of right upper extremity: Secondary | ICD-10-CM

## 2019-12-19 DIAGNOSIS — Z5181 Encounter for therapeutic drug level monitoring: Secondary | ICD-10-CM

## 2019-12-19 LAB — POCT INR: INR: 2.5 (ref 2.0–3.0)

## 2019-12-19 NOTE — Patient Instructions (Signed)
Continue warfarin 1/2 tablet daily except 1 tablet on Mondays, Wednesdays and Fridays.   Recheck INR 6 weeks

## 2019-12-24 ENCOUNTER — Other Ambulatory Visit: Payer: Self-pay | Admitting: *Deleted

## 2019-12-24 MED ORDER — PANTOPRAZOLE SODIUM 40 MG PO TBEC: 40.0000 mg | DELAYED_RELEASE_TABLET | Freq: Every day | ORAL | 1 refills | Status: DC

## 2020-01-29 ENCOUNTER — Ambulatory Visit (INDEPENDENT_AMBULATORY_CARE_PROVIDER_SITE_OTHER): Payer: Medicare Other | Admitting: *Deleted

## 2020-01-29 DIAGNOSIS — I742 Embolism and thrombosis of arteries of the upper extremities: Secondary | ICD-10-CM | POA: Diagnosis not present

## 2020-01-29 DIAGNOSIS — Z5181 Encounter for therapeutic drug level monitoring: Secondary | ICD-10-CM | POA: Diagnosis not present

## 2020-01-29 DIAGNOSIS — I82621 Acute embolism and thrombosis of deep veins of right upper extremity: Secondary | ICD-10-CM | POA: Diagnosis not present

## 2020-01-29 LAB — POCT INR: INR: 4 — AB (ref 2.0–3.0)

## 2020-01-29 NOTE — Patient Instructions (Signed)
Hold warfarin tonight then resume 1/2 tablet daily except 1 tablet on Mondays, Wednesdays and Fridays.   Recheck INR 3 weeks

## 2020-02-14 ENCOUNTER — Ambulatory Visit: Payer: Medicare Other | Admitting: Family Medicine

## 2020-02-18 ENCOUNTER — Ambulatory Visit (INDEPENDENT_AMBULATORY_CARE_PROVIDER_SITE_OTHER): Payer: Medicare Other | Admitting: *Deleted

## 2020-02-18 DIAGNOSIS — I82621 Acute embolism and thrombosis of deep veins of right upper extremity: Secondary | ICD-10-CM

## 2020-02-18 DIAGNOSIS — Z5181 Encounter for therapeutic drug level monitoring: Secondary | ICD-10-CM

## 2020-02-18 LAB — POCT INR: INR: 3.4 — AB (ref 2.0–3.0)

## 2020-02-18 NOTE — Patient Instructions (Signed)
Hold warfarin tonight then decrease dose to 1/2 tablet daily except 1 tablet on Mondays and Fridays.   Recheck INR 3 weeks

## 2020-02-29 ENCOUNTER — Other Ambulatory Visit: Payer: Self-pay | Admitting: Family Medicine

## 2020-03-10 ENCOUNTER — Ambulatory Visit (INDEPENDENT_AMBULATORY_CARE_PROVIDER_SITE_OTHER): Payer: Medicare Other | Admitting: *Deleted

## 2020-03-10 DIAGNOSIS — Z5181 Encounter for therapeutic drug level monitoring: Secondary | ICD-10-CM | POA: Diagnosis not present

## 2020-03-10 DIAGNOSIS — I82621 Acute embolism and thrombosis of deep veins of right upper extremity: Secondary | ICD-10-CM

## 2020-03-10 LAB — POCT INR: INR: 2.2 (ref 2.0–3.0)

## 2020-03-10 NOTE — Patient Instructions (Signed)
Continue warfarin 1/2 tablet daily except 1 tablet on Mondays and Fridays.   ?Recheck INR 4 weeks  ?

## 2020-04-07 ENCOUNTER — Ambulatory Visit (INDEPENDENT_AMBULATORY_CARE_PROVIDER_SITE_OTHER): Payer: Medicare Other | Admitting: *Deleted

## 2020-04-07 DIAGNOSIS — Z5181 Encounter for therapeutic drug level monitoring: Secondary | ICD-10-CM

## 2020-04-07 DIAGNOSIS — I82621 Acute embolism and thrombosis of deep veins of right upper extremity: Secondary | ICD-10-CM

## 2020-04-07 LAB — POCT INR: INR: 3 (ref 2.0–3.0)

## 2020-04-07 NOTE — Patient Instructions (Signed)
Continue warfarin 1/2 tablet daily except 1 tablet on Mondays and Fridays.   ?Recheck INR 4 weeks  ?

## 2020-05-09 ENCOUNTER — Other Ambulatory Visit: Payer: Self-pay | Admitting: Family Medicine

## 2020-05-13 ENCOUNTER — Ambulatory Visit (INDEPENDENT_AMBULATORY_CARE_PROVIDER_SITE_OTHER): Payer: Medicare Other | Admitting: *Deleted

## 2020-05-13 DIAGNOSIS — Z5181 Encounter for therapeutic drug level monitoring: Secondary | ICD-10-CM | POA: Diagnosis not present

## 2020-05-13 DIAGNOSIS — I82621 Acute embolism and thrombosis of deep veins of right upper extremity: Secondary | ICD-10-CM | POA: Diagnosis not present

## 2020-05-13 LAB — POCT INR: INR: 2.9 (ref 2.0–3.0)

## 2020-05-13 NOTE — Patient Instructions (Signed)
Continue warfarin 1/2 tablet daily except 1 tablet on Mondays and Fridays.   Recheck INR 6 weeks  

## 2020-05-15 ENCOUNTER — Other Ambulatory Visit: Payer: Self-pay | Admitting: Family Medicine

## 2020-05-18 NOTE — Telephone Encounter (Signed)
Sent my chart message to schedule appointment.

## 2020-05-18 NOTE — Telephone Encounter (Signed)
Please scheduled appt and then route back so refill can be sent in

## 2020-05-21 ENCOUNTER — Telehealth: Payer: Self-pay | Admitting: Family Medicine

## 2020-05-21 DIAGNOSIS — E7849 Other hyperlipidemia: Secondary | ICD-10-CM

## 2020-05-21 DIAGNOSIS — I10 Essential (primary) hypertension: Secondary | ICD-10-CM

## 2020-05-21 DIAGNOSIS — R7303 Prediabetes: Secondary | ICD-10-CM

## 2020-05-21 NOTE — Telephone Encounter (Signed)
Appointment on 3/2

## 2020-05-21 NOTE — Telephone Encounter (Signed)
Patient has appointment 3/2 and wanting to know if she needing labs done for her sugar to be checked.

## 2020-05-21 NOTE — Telephone Encounter (Signed)
Last labs completed 11/12/19 BMET, TSH, Free T4 Active labs from 8/21 A1C, Lipid, Hepatic and Bmet.  Please advise. Thank you

## 2020-05-22 NOTE — Telephone Encounter (Signed)
HTN, hyperlipidemia, diabetes A1c, CMP, lipid, urine ACR  Please inform the patient her blood work also includes a urine to check for protein thank you

## 2020-05-22 NOTE — Telephone Encounter (Signed)
Lab orders placed and pt is aware 

## 2020-05-23 ENCOUNTER — Other Ambulatory Visit: Payer: Self-pay | Admitting: Family Medicine

## 2020-05-25 ENCOUNTER — Other Ambulatory Visit: Payer: Self-pay | Admitting: Family Medicine

## 2020-05-25 NOTE — Telephone Encounter (Signed)
Last visit for htn 11/12/19

## 2020-05-27 ENCOUNTER — Telehealth: Payer: Self-pay

## 2020-05-27 ENCOUNTER — Other Ambulatory Visit: Payer: Self-pay | Admitting: *Deleted

## 2020-05-27 MED ORDER — PANTOPRAZOLE SODIUM 40 MG PO TBEC: 40.0000 mg | DELAYED_RELEASE_TABLET | Freq: Two times a day (BID) | ORAL | 1 refills | Status: DC

## 2020-05-27 NOTE — Telephone Encounter (Signed)
Pt states her protonix was suppose to be switched to one bid instead of one a day and it wasn't and now she has been out of med for 2 weeks. She would like corrected to say one bid. [facility].

## 2020-05-27 NOTE — Telephone Encounter (Signed)
Pt wanted Jacqueline Morgan to call about a RX that was faxed over she said it was messed up and needed to talk to [place].   Pt call back [phone]

## 2020-05-27 NOTE — Telephone Encounter (Signed)
Plz correct and do bid 3 month with 1 rf

## 2020-05-27 NOTE — Telephone Encounter (Signed)
Refills sent. Pt was notified.

## 2020-05-29 DIAGNOSIS — I10 Essential (primary) hypertension: Secondary | ICD-10-CM | POA: Diagnosis not present

## 2020-05-29 DIAGNOSIS — R7303 Prediabetes: Secondary | ICD-10-CM | POA: Diagnosis not present

## 2020-05-29 DIAGNOSIS — E7849 Other hyperlipidemia: Secondary | ICD-10-CM | POA: Diagnosis not present

## 2020-05-30 LAB — COMPREHENSIVE METABOLIC PANEL
ALT: 23 IU/L (ref 0–32)
AST: 33 IU/L (ref 0–40)
Albumin/Globulin Ratio: 1.2 (ref 1.2–2.2)
Albumin: 3.8 g/dL (ref 3.7–4.7)
Alkaline Phosphatase: 108 IU/L (ref 44–121)
BUN/Creatinine Ratio: 9 — ABNORMAL LOW (ref 12–28)
BUN: 10 mg/dL (ref 8–27)
Bilirubin Total: 0.3 mg/dL (ref 0.0–1.2)
CO2: 19 mmol/L — ABNORMAL LOW (ref 20–29)
Calcium: 9.1 mg/dL (ref 8.7–10.3)
Chloride: 101 mmol/L (ref 96–106)
Creatinine, Ser: 1.11 mg/dL — ABNORMAL HIGH (ref 0.57–1.00)
GFR calc Af Amer: 55 mL/min/{1.73_m2} — ABNORMAL LOW (ref >59)
GFR calc non Af Amer: 47 mL/min/{1.73_m2} — ABNORMAL LOW (ref >59)
Globulin, Total: 3.3 g/dL (ref 1.5–4.5)
Glucose: 135 mg/dL — ABNORMAL HIGH (ref 65–99)
Potassium: 4.6 mmol/L (ref 3.5–5.2)
Sodium: 138 mmol/L (ref 134–144)
Total Protein: 7.1 g/dL (ref 6.0–8.5)

## 2020-05-30 LAB — LIPID PANEL
Chol/HDL Ratio: 8.7 ratio — ABNORMAL HIGH (ref 0.0–4.4)
Cholesterol, Total: 321 mg/dL — ABNORMAL HIGH (ref 100–199)
HDL: 37 mg/dL — ABNORMAL LOW (ref >39)
LDL Chol Calc (NIH): 222 mg/dL — ABNORMAL HIGH (ref 0–99)
Triglycerides: 299 mg/dL — ABNORMAL HIGH (ref 0–149)
VLDL Cholesterol Cal: 62 mg/dL — ABNORMAL HIGH (ref 5–40)

## 2020-05-30 LAB — HEMOGLOBIN A1C
Est. average glucose Bld gHb Est-mCnc: 143 mg/dL
Hgb A1c MFr Bld: 6.6 % — ABNORMAL HIGH (ref 4.8–5.6)

## 2020-05-30 LAB — MICROALBUMIN / CREATININE URINE RATIO
Creatinine, Urine: 110.6 mg/dL
Microalb/Creat Ratio: 7 mg/g creat (ref 0–29)
Microalbumin, Urine: 7.6 ug/mL

## 2020-06-03 ENCOUNTER — Other Ambulatory Visit: Payer: Self-pay

## 2020-06-03 ENCOUNTER — Telehealth: Payer: Self-pay | Admitting: *Deleted

## 2020-06-03 ENCOUNTER — Telehealth (INDEPENDENT_AMBULATORY_CARE_PROVIDER_SITE_OTHER): Payer: Medicare Other | Admitting: Family Medicine

## 2020-06-03 DIAGNOSIS — E785 Hyperlipidemia, unspecified: Secondary | ICD-10-CM | POA: Diagnosis not present

## 2020-06-03 DIAGNOSIS — R7303 Prediabetes: Secondary | ICD-10-CM

## 2020-06-03 DIAGNOSIS — I10 Essential (primary) hypertension: Secondary | ICD-10-CM | POA: Diagnosis not present

## 2020-06-03 DIAGNOSIS — E1169 Type 2 diabetes mellitus with other specified complication: Secondary | ICD-10-CM | POA: Diagnosis not present

## 2020-06-03 DIAGNOSIS — F411 Generalized anxiety disorder: Secondary | ICD-10-CM

## 2020-06-03 MED ORDER — ROSUVASTATIN CALCIUM 10 MG PO TABS: 10.0000 mg | ORAL_TABLET | Freq: Every day | ORAL | 1 refills | Status: DC

## 2020-06-03 MED ORDER — LORAZEPAM 1 MG PO TABS: ORAL_TABLET | ORAL | 5 refills | Status: DC

## 2020-06-03 MED ORDER — MIRTAZAPINE 7.5 MG PO TABS: 7.5000 mg | ORAL_TABLET | Freq: Every day | ORAL | 5 refills | Status: DC

## 2020-06-03 NOTE — Progress Notes (Signed)
Subjective:    Patient ID: Jacqueline Morgan, female    DOB: 08-13-1940, 80 y.o.   MRN: [id number]  Hypertension This is a chronic problem. The current episode started more than 1 year ago. Risk factors for coronary artery disease include dyslipidemia and post-menopausal state. Treatments tried: metoprolol. There are no compliance problems.    Patient uses [facility] and [employer] Visit via Telephone  I connected with Jacqueline Morgan on 06/03/20 at  1:10 PM EST by a phone enabled telemedicine application and verified that I am speaking with the correct person using two identifiers.  Location: Patient: home Provider: office   I discussed the limitations of evaluation and management by telemedicine and the availability of in person appointments. The patient expressed understanding and agreed to proceed.  History of Present Illness:    Observations/Objective:   Assessment and Plan:   Follow Up Instructions:    I discussed the assessment and treatment plan with the patient. The patient was provided an opportunity to ask questions and all were answered. The patient agreed with the plan and demonstrated an understanding of the instructions.   The patient was advised to call back or seek an in-person evaluation if the symptoms worsen or if the condition fails to improve as anticipated.  I provided 22 minutes of non-face-to-face time during this encounter.  Results for orders placed or performed in visit on 05/21/20  Hemoglobin A1c  Result Value Ref Range   Hgb A1c MFr Bld 6.6 (H) 4.8 - 5.6 %   Est. average glucose Bld gHb Est-mCnc 143 mg/dL  Comprehensive Metabolic Panel (CMET)  Result Value Ref Range   Glucose 135 (H) 65 - 99 mg/dL   BUN 10 8 - 27 mg/dL   Creatinine, Ser 1.11 (H) 0.57 - 1.00 mg/dL   GFR calc non Af Amer 47 (L) >59 mL/min/1.73   GFR calc Af Amer 55 (L) >59 mL/min/1.73   BUN/Creatinine Ratio 9 (L) 12 - 28   Sodium 138 134 - 144 mmol/L    Potassium 4.6 3.5 - 5.2 mmol/L   Chloride 101 96 - 106 mmol/L   CO2 19 (L) 20 - 29 mmol/L   Calcium 9.1 8.7 - 10.3 mg/dL   Total Protein 7.1 6.0 - 8.5 g/dL   Albumin 3.8 3.7 - 4.7 g/dL   Globulin, Total 3.3 1.5 - 4.5 g/dL   Albumin/Globulin Ratio 1.2 1.2 - 2.2   Bilirubin Total 0.3 0.0 - 1.2 mg/dL   Alkaline Phosphatase 108 44 - 121 IU/L   AST 33 0 - 40 IU/L   ALT 23 0 - 32 IU/L  Lipid Profile  Result Value Ref Range   Cholesterol, Total 321 (H) 100 - 199 mg/dL   Triglycerides 299 (H) 0 - 149 mg/dL   HDL 37 (L) >39 mg/dL   VLDL Cholesterol Cal 62 (H) 5 - 40 mg/dL   LDL Chol Calc (NIH) 222 (H) 0 - 99 mg/dL   Chol/HDL Ratio 8.7 (H) 0.0 - 4.4 ratio  Urine Microalbumin w/creat. ratio  Result Value Ref Range   Creatinine, Urine 110.6 Not Estab. mg/dL   Microalbumin, Urine 7.6 Not Estab. ug/mL   Microalb/Creat Ratio 7 0 - 29 mg/g creat        Review of Systems     Objective:   Physical Exam   Today's visit was via telephone Physical exam was not possible for this visit      Assessment & Plan:  1. Essential  hypertension Patient feels her blood pressure under good control she is taking her metoprolol regular basis  2. GAD (generalized anxiety disorder) Suffers with significant anxiety also feels down about the loss of her husband from the past trying to deal with things as best she can.  Uses her lorazepam to help with her persistent anxiety takes her Paxil to help with the depression feels it is under good control and uses mirtazapine at nighttime to help with sleep we did discuss having her try just a half a tablet at nighttime to see if she would do better in regards to rest without as much daytime fatigue  3. Hyperlipidemia associated with type 2 diabetes mellitus ([place]) She does agree to restarting Crestor 10 mg daily.  We will check lipid liver profile along with A1c in approximately 3 to 4 months with follow-up office visit

## 2020-06-03 NOTE — Telephone Encounter (Signed)
[name], billiter are scheduled for a virtual visit with your provider today.    Just as we do with appointments in the office, we must obtain your consent to participate.  Your consent will be active for this visit and any virtual visit you may have with one of our providers in the next 365 days.    If you have a MyChart account, I can also send a copy of this consent to you electronically.  All virtual visits are billed to your insurance company just like a traditional visit in the office.  As this is a virtual visit, video technology does not allow for your provider to perform a traditional examination.  This may limit your provider's ability to fully assess your condition.  If your provider identifies any concerns that need to be evaluated in person or the need to arrange testing such as labs, EKG, etc, we will make arrangements to do so.    Although advances in technology are sophisticated, we cannot ensure that it will always work on either your end or our end.  If the connection with a video visit is poor, we may have to switch to a telephone visit.  With either a video or telephone visit, we are not always able to ensure that we have a secure connection.   I need to obtain your verbal consent now.   Are you willing to proceed with your visit today?   Jacqueline Morgan has provided verbal consent on 06/03/2020 for a virtual visit (video or telephone).

## 2020-07-02 ENCOUNTER — Ambulatory Visit: Payer: Medicare Other | Admitting: Cardiology

## 2020-07-02 NOTE — Progress Notes (Deleted)
Clinical Summary Jacqueline Morgan is a 80 y.o.female seen today for follow up of the following medical problems.   1. 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  - underwent left arm thromboembolectomy 12/19/12 by Dr Adele Barthel  - echo was negative for thrombus. There was some question of possible aortic arch atherosclerosis. Discharged on coumadin. - denies any bleeding issues on coumadin. Notes some occsaional pain in left arm with activity that is unchanged..  - has not been in favor of NOACs   - denies any bleeding on coumadin   2. Hyperlipidemia 10/2017 TC 368 TG 374 HDL 36 LDL 257 9 /2020 TC 372 TG 392 LDL 248 - had tried crestor 5mg  every other daya few years ago, did not tolerate due to muscle aches.. She reports side effects on lipitorin the past. She is unsure of her other statin histoyr.  -. She reports palpitations on pravastatinand she stopped taking.  - taking crestor twice a week, tolerating - she reports muscle aches on zetia, stopped taking - muscle aches on crestor twice a week - remains resistant to pcsk9 inhibitors  3. DOE  - recent symptoms with short distances - chronic cough, wheezing she reports 2017 PFTs normal CXR no acute process BNP 34   08/2019 echo LVEF 60-65%, grade I DDx, normal RV 07/2019 CXR no acute process SOB singing in church, showering.    SH: sister passed awayNormal Jacqueline Morgan, who was also a patientof mine.   Past Medical History:  Diagnosis Date  . Anxiety   . Complication of anesthesia    passed out after dental surgery  . Depression   . Fibromyalgia   . GERD (gastroesophageal reflux disease)   . Hyperlipidemia   . Hypertension   . Palpitations   . 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 Codeine     REACTION: Adverse gastrointestinal effects  . Statins     Felt bad     Current  Outpatient Medications  Medication Sig Dispense Refill  . acetaminophen (TYLENOL) 500 MG tablet Take 1,000 mg by mouth every 8 (eight) hours as needed for moderate pain. For headache/pain     . LORazepam (ATIVAN) 1 MG tablet TAKE (1/2) TO (1) TABLET TWICE DAILY AS NEEDED. 60 tablet 5  . metoprolol succinate (TOPROL-XL) 25 MG 24 hr tablet TAKE 1 TABLET BY MOUTH  DAILY WITH OR IMMEDIATELY  FOLLOWING A MEAL 90 tablet 0  . mirtazapine (REMERON) 7.5 MG tablet Take 1 tablet (7.5 mg total) by mouth at bedtime. 30 tablet 5  . pantoprazole (PROTONIX) 40 MG tablet Take 1 tablet (40 mg total) by mouth 2 (two) times daily. 180 tablet 1  . PARoxetine (PAXIL) 20 MG tablet TAKE 1 TABLET BY MOUTH IN  THE MORNING AND 2 TABLETS  BY MOUTH IN THE EVENING 270 tablet 0  . rosuvastatin (CRESTOR) 10 MG tablet Take 1 tablet (10 mg total) by mouth daily. 90 tablet 1  . warfarin (COUMADIN) 5 MG tablet TAKE 1/2 -1 TABLET BY MOUTH DAILY AS DIRECTED. (Patient taking differently: Take 2.5-5 mg by mouth See admin instructions. TAKE 1/2 -1 TABLET BY MOUTH DAILY AS DIRECTED.  5 mg on Mon, Wed, Fri, and 2.5 all other days) 30 tablet 6   No current facility-administered medications for this visit.     Past Surgical History:  Procedure Laterality Date  . COLONOSCOPY  N/A 04/01/2015   Procedure: COLONOSCOPY;  Surgeon: Rogene Houston, MD;  [facility];  Service: Endoscopy;  Laterality: N/A;  1:45  . COLONOSCOPY WITH PROPOFOL N/A 10/18/2019   Procedure: COLONOSCOPY WITH PROPOFOL;  Surgeon: Rogene Houston, MD;  Location: [facility];  Service: Endoscopy;  Laterality: N/A;  730  . EMBOLECTOMY Left 12/19/2012   Procedure: EMBOLECTOMY OF LEFT BRACHIAL ARTERY;  Surgeon: Conrad [place], MD;  Location: [place];  Service: Vascular;  Laterality: Left;  . EYE SURGERY    . POLYPECTOMY  10/18/2019   Procedure: POLYPECTOMY;  Surgeon: Rogene Houston, MD;  Location: [facility];  Service: Endoscopy;;     Allergies  Allergen  Reactions  . Codeine     REACTION: Adverse gastrointestinal effects  . Statins     Felt bad      Family History  Problem Relation Age of Onset  . Cancer Mother        ovarian, colon  . Heart disease Mother   . Heart attack Father   . Glaucoma Father   . COPD Sister   . Benign prostatic hyperplasia Brother      Social History [name] reports that she has never smoked. She has never used smokeless tobacco. [name] reports no history of alcohol use.   Review of Systems CONSTITUTIONAL: No weight loss, fever, chills, weakness or fatigue.  HEENT: Eyes: No visual loss, blurred vision, double vision or yellow sclerae.No hearing loss, sneezing, congestion, runny nose or sore throat.  SKIN: No rash or itching.  CARDIOVASCULAR:  RESPIRATORY: No shortness of breath, cough or sputum.  GASTROINTESTINAL: No anorexia, nausea, vomiting or diarrhea. No abdominal pain or blood.  GENITOURINARY: No burning on urination, no polyuria NEUROLOGICAL: No headache, dizziness, syncope, paralysis, ataxia, numbness or tingling in the extremities. No change in bowel or bladder control.  MUSCULOSKELETAL: No muscle, back pain, joint pain or stiffness.  LYMPHATICS: No enlarged nodes. No history of splenectomy.  PSYCHIATRIC: No history of depression or anxiety.  ENDOCRINOLOGIC: No reports of sweating, cold or heat intolerance. No polyuria or polydipsia.  [name]   Physical Examination There were no vitals filed for this visit. There were no vitals filed for this visit.  Gen: resting comfortably, no acute distress HEENT: no scleral icterus, pupils equal round and reactive, no palptable cervical adenopathy,  CV Resp: Clear to auscultation bilaterally GI: abdomen is soft, non-tender, non-distended, normal bowel sounds, no hepatosplenomegaly MSK: extremities are warm, no edema.  Skin: warm, no rash Neuro:  no focal deficits Psych: appropriate affect   Diagnostic Studies 05/2013 21 day Event  monitor No arrythmias  07/2015 echo Study Conclusions  - Left ventricle: The cavity size was normal. Wall thickness was  increased in a pattern of mild LVH. Systolic function was normal.  The estimated ejection fraction was in the range of 60% to 65%.  Wall motion was normal; there were no regional wall motion  abnormalities. Doppler parameters are consistent with abnormal  left ventricular relaxation (grade 1 diastolic dysfunction). - Aortic valve: Mildly calcified annulus. Trileaflet; mildly  thickened leaflets. Valve area (VTI): 2.21 cm^2. Valve area  (Vmax): 2.35 cm^2. - Mitral valve: Mildly calcified annulus. Mildly thickened leaflets  . - Technically adequate study.  07/2015 LUE Arterial US IMPRESSION: Left upper extremity arterial noninvasive study demonstrates no evidence of significant arterial occlusive disease.  10/2015 Lexiscan MPI  There was no ST segment deviation noted during stress.  The study is normal. There are  no perfusion defects consistent with prior infarct or current ischemia.  This is a low risk study.  The left ventricular ejection fraction is normal (55-65%).  08/2019 echo IMPRESSIONS    1. Left ventricular ejection fraction, by estimation, is 60 to 65%. The  left ventricle has normal function. Left ventricular endocardial border  not optimally defined to evaluate regional wall motion. There is mild left  ventricular hypertrophy of the  posterior segment. Left ventricular diastolic parameters are consistent  with Grade I diastolic dysfunction (impaired relaxation).  2. Right ventricular systolic function is normal. The right ventricular  size is normal. There is normal pulmonary artery systolic pressure.  3. The mitral valve is degenerative. Trivial mitral valve regurgitation.  4. The aortic valve is tricuspid. Aortic valve regurgitation is not  visualized. No aortic stenosis is present.     Assessment and Plan  1. Left  brachial artery occlusion  - secondary to thromboembolism, unclear source of the embolism (cardiac vs aortic atheroscerlosis)  -continue anticoag, she has not been interested in NOACs, continue coumadin.    2. Hyperlipidemia -side effects on statins and zetia - continues to turn down pcsk9 inhibitors  3. DOE - extensive cardaic testing over the years has been overall benign - defer to pcp whether to pursue any repeat pulmonary testing. I thnk deconditioning is certalintly playing at least some role      Arnoldo Lenis, M.D., F.A.C.C.

## 2020-07-07 ENCOUNTER — Ambulatory Visit (INDEPENDENT_AMBULATORY_CARE_PROVIDER_SITE_OTHER): Payer: Medicare Other | Admitting: *Deleted

## 2020-07-07 DIAGNOSIS — Z5181 Encounter for therapeutic drug level monitoring: Secondary | ICD-10-CM | POA: Diagnosis not present

## 2020-07-07 DIAGNOSIS — I82621 Acute embolism and thrombosis of deep veins of right upper extremity: Secondary | ICD-10-CM | POA: Diagnosis not present

## 2020-07-07 LAB — POCT INR: INR: 2.9 (ref 2.0–3.0)

## 2020-07-07 NOTE — Patient Instructions (Signed)
Continue warfarin 1/2 tablet daily except 1 tablet on Mondays and Fridays.   Recheck INR 6 weeks  

## 2020-07-24 ENCOUNTER — Other Ambulatory Visit: Payer: Self-pay | Admitting: Cardiology

## 2020-07-24 NOTE — Telephone Encounter (Signed)
Prescription refill request received for warfarin Lov: 10/08/2019 Next INR check: 5/17 Warfarin tablet strength: 5mg 

## 2020-07-25 ENCOUNTER — Other Ambulatory Visit: Payer: Self-pay | Admitting: Family Medicine

## 2020-08-01 ENCOUNTER — Other Ambulatory Visit: Payer: Self-pay | Admitting: Family Medicine

## 2020-08-18 ENCOUNTER — Ambulatory Visit (INDEPENDENT_AMBULATORY_CARE_PROVIDER_SITE_OTHER): Payer: Medicare Other | Admitting: *Deleted

## 2020-08-18 DIAGNOSIS — I82621 Acute embolism and thrombosis of deep veins of right upper extremity: Secondary | ICD-10-CM | POA: Diagnosis not present

## 2020-08-18 DIAGNOSIS — I742 Embolism and thrombosis of arteries of the upper extremities: Secondary | ICD-10-CM

## 2020-08-18 DIAGNOSIS — Z5181 Encounter for therapeutic drug level monitoring: Secondary | ICD-10-CM

## 2020-08-18 LAB — POCT INR: INR: 2.5 (ref 2.0–3.0)

## 2020-08-18 NOTE — Patient Instructions (Signed)
Continue warfarin 1/2 tablet daily except 1 tablet on Mondays and Fridays.   Recheck INR 6 weeks  

## 2020-08-28 ENCOUNTER — Ambulatory Visit: Payer: Medicare Other | Admitting: Cardiology

## 2020-09-03 ENCOUNTER — Encounter: Payer: Self-pay | Admitting: Family Medicine

## 2020-09-04 ENCOUNTER — Ambulatory Visit (INDEPENDENT_AMBULATORY_CARE_PROVIDER_SITE_OTHER): Payer: Medicare Other | Admitting: Family Medicine

## 2020-09-04 ENCOUNTER — Encounter: Payer: Self-pay | Admitting: Family Medicine

## 2020-09-04 ENCOUNTER — Other Ambulatory Visit: Payer: Self-pay

## 2020-09-04 VITALS — HR 77 | Temp 100.8°F | Ht 60.0 in | Wt 174.0 lb

## 2020-09-04 DIAGNOSIS — I70208 Unspecified atherosclerosis of native arteries of extremities, other extremity: Secondary | ICD-10-CM

## 2020-09-04 DIAGNOSIS — E1169 Type 2 diabetes mellitus with other specified complication: Secondary | ICD-10-CM

## 2020-09-04 DIAGNOSIS — E785 Hyperlipidemia, unspecified: Secondary | ICD-10-CM

## 2020-09-04 MED ORDER — MOLNUPIRAVIR EUA 200MG CAPSULE: 4.0000 | ORAL_CAPSULE | Freq: Two times a day (BID) | ORAL | 0 refills | Status: AC

## 2020-09-04 NOTE — Telephone Encounter (Signed)
Called and scheduled appt for today at 11:45

## 2020-09-04 NOTE — Progress Notes (Signed)
   Subjective:    Patient ID: Jacqueline Morgan, female    DOB: Aug 26, 1940, 80 y.o.   MRN: [id number]  HPI pt having headache, chills and temp of 100.8. started 2 days ago. Took covid test yesterday and it was positive.  Patient had not taken COVID-vaccine because of worries and concerns regarding vaccines Patient had been encouraged in the past to take COVID-vaccine She relates body aches fever chills fatigue tiredness denies shortness of breath Patient is able to relate to [place] without any shortness of breath exhibited  Review of Systems     Objective:   Physical Exam O2 saturation 95% lungs are clear heart regular pulse normal BP good       Assessment & Plan:  COVID infection Warning signs discussed Due to medications and interaction viral medicine was prescribed  molnupiravir was utilized.  If excessive nausea vomiting or diarrhea to stop medicine  Due to her history of brachial artery occlusion we will not use Paxlovid because that could interact with her Coumadin

## 2020-09-04 NOTE — Patient Instructions (Signed)
molnupiravir 4 capsules twice a day for 5 days Will get along with your meds  [facility]: What to Do if You Are Sick If you have a fever, cough or other symptoms, you might have COVID-19. Most people have mild illness and are able to recover at home. If you are sick:  Keep track of your symptoms.  If you have an emergency warning sign (including trouble breathing), call 911. Steps to help prevent the spread of COVID-19 if you are sick If you are sick with COVID-19 or think you might have COVID-19, follow the steps below to care for yourself and to help protect other people in your home and community. Stay home except to get medical care  Stay home. Most people with COVID-19 have mild illness and can recover at home without medical care. Do not leave your home, except to get medical care. Do not visit public areas.  Take care of yourself. Get rest and stay hydrated. Take over-the-counter medicines, such as acetaminophen, to help you feel better.  Stay in touch with your doctor. Call before you get medical care. Be sure to get care if you have trouble breathing, or have any other emergency warning signs, or if you think it is an emergency.  Avoid public transportation, ride-sharing, or taxis. Separate yourself from other people As much as possible, stay in a specific room and away from other people and pets in your home. If possible, you should use a separate bathroom. If you need to be around other people or animals in or outside of the home, wear a mask. Tell your close contactsthat they may have been exposed to COVID-19. An infected person can spread COVID-19 starting 48 hours (or 2 days) before the person has any symptoms or tests positive. By letting your close contacts know they may have been exposed to COVID-19, you are helping to protect everyone.  Additional guidance is available for those living in close quarters and shared housing.  See COVID-19 and  Animals if you have questions about pets.  If you are diagnosed with COVID-19, someone from the health department may call you. Answer the call to slow the spread. Monitor your symptoms  Symptoms of COVID-19 include fever, cough, or other symptoms.  Follow care instructions from your healthcare provider and local health department. Your local health authorities may give instructions on checking your symptoms and reporting information. When to seek emergency medical attention Look for emergency warning signs* for COVID-19. If someone is showing any of these signs, seek emergency medical care immediately:  Trouble breathing  Persistent pain or pressure in the chest  New confusion  Inability to wake or stay awake  Pale, gray, or blue-colored skin, lips, or nail beds, depending on skin tone *This list is not all possible symptoms. Please call your medical provider for any other symptoms that are severe or concerning to you. Call 911 or call ahead to your local emergency facility: Notify the operator that you are seeking care for someone who has or may have COVID-19. Call ahead before visiting your doctor  Call ahead. Many medical visits for routine care are being postponed or done by phone or telemedicine.  If you have a medical appointment that cannot be postponed, call your doctor's office, and tell them you have or may have COVID-19. This will help the office protect themselves and other patients. Get  tested  If you have symptoms of COVID-19, get tested. While waiting for test results,  you stay away from others, including staying apart from those living in your household.  You can visit your state, tribal, local, and territorialhealth department's website to look for the latest local information on testing sites. If you are sick, wear a mask over your nose and mouth  You should wear a mask over your nose and mouth if you must be around other people or animals, including pets (even  at home).  You don't need to wear the mask if you are alone. If you can't put on a mask (because of trouble breathing, for example), cover your coughs and sneezes in some other way. Try to stay at least 6 feet away from other people. This will help protect the people around you.  Masks should not be placed on young children under age 77 years, anyone who has trouble breathing, or anyone who is not able to remove the mask without help. Note: During the COVID-19 pandemic, medical grade facemasks are reserved for healthcare workers and some first responders. Cover your coughs and sneezes  Cover your mouth and nose with a tissue when you cough or sneeze.  Throw away used tissues in a lined trash can.  Immediately wash your hands with soap and water for at least 20 seconds. If soap and water are not available, clean your hands with an alcohol-based hand sanitizer that contains at least 60% alcohol. Clean your hands often  Wash your hands often with soap and water for at least 20 seconds. This is especially important after blowing your nose, coughing, or sneezing; going to the bathroom; and before eating or preparing food.  Use hand sanitizer if soap and water are not available. Use an alcohol-based hand sanitizer with at least 60% alcohol, covering all surfaces of your hands and rubbing them together until they feel dry.  Soap and water are the best option, especially if hands are visibly dirty.  Avoid touching your eyes, nose, and mouth with unwashed hands.  Handwashing Tips Avoid sharing personal household items  Do not share dishes, drinking glasses, cups, eating utensils, towels, or bedding with other people in your home.  Wash these items thoroughly after using them with soap and water or put in the dishwasher. Clean all "high-touch" surfaces everyday  Clean and disinfect high-touch surfaces in your "sick room" and bathroom; wear disposable gloves. Let someone else clean and disinfect  surfaces in common areas, but you should clean your bedroom and bathroom, if possible.  If a caregiver or other person needs to clean and disinfect a sick person's bedroom or bathroom, they should do so on an as-needed basis. The caregiver/other person should wear a mask and disposable gloves prior to cleaning. They should wait as long as possible after the person who is sick has used the bathroom before coming in to clean and use the bathroom. ? High-touch surfaces include phones, remote controls, counters, tabletops, doorknobs, bathroom fixtures, toilets, keyboards, tablets, and bedside tables.  Clean and disinfect areas that may have blood, stool, or body fluids on them.  Use household cleaners and disinfectants. Clean the area or item with soap and water or another detergent if it is dirty. Then, use a household disinfectant. ? Be sure to follow the instructions on the label to ensure safe and effective use of the product. Many products recommend keeping the surface wet for several minutes to ensure germs are killed. Many also recommend precautions such as wearing gloves and making sure you have good ventilation during use of the  product. ? Use a product from [employer] List N: Disinfectants for Coronavirus (EBRAX-09). ? Complete Disinfection Guidance When you can be around others after being sick with COVID-19 Deciding when you can be around others is different for different situations. Find out when you can safely end home isolation. For any additional questions about your care, contact your healthcare provider or state or local health department. 06/19/2019 Content source: [facility] ([facility]), Division of Viral Diseases This information is not intended to replace advice given to you by your health care provider. Make sure you discuss any questions you have with your health care provider. Document Revised: 02/03/2020 Document Reviewed:  02/03/2020 Elsevier Patient Education  2021 [employer].

## 2020-09-08 ENCOUNTER — Other Ambulatory Visit: Payer: Self-pay | Admitting: Cardiology

## 2020-09-14 ENCOUNTER — Telehealth: Payer: Self-pay

## 2020-09-14 MED ORDER — AMOXICILLIN-POT CLAVULANATE 875-125 MG PO TABS: 1.0000 | ORAL_TABLET | Freq: Two times a day (BID) | ORAL | 0 refills | Status: AC

## 2020-09-14 NOTE — Telephone Encounter (Signed)
I recommend Augmentin 875 mg 1 taken twice daily, #14 take with a snack, it would be wise to recheck her again tomorrow at 11:50 AM, we will need to make sure she is not dealing with pneumonia, the antibiotic is a good antibiotic that should help, obviously if dramatically worse later tonight it would be wise to go to the emergency department-the likelihood of this is low

## 2020-09-14 NOTE — Telephone Encounter (Signed)
Patient states her breathing is not bothering her it is just the cough and fever that is bothering her esp the cough- the fever comes and goes

## 2020-09-14 NOTE — Telephone Encounter (Signed)
Patient notified and scheduled office visit for tomorrow at 11:50am with Dr Nicki Reaper. Prescription sent electronically to pharmacy. Patient will go to ER tonight if worse.

## 2020-09-14 NOTE — Telephone Encounter (Signed)
Pt was in here 3rd of June with Covid and she got better with that and now she has cough and running a fever and wants to see if something can be called in. Pt uses [facility] now is 99  was 101.8 last night   Pt call back [phone]

## 2020-09-15 ENCOUNTER — Ambulatory Visit: Payer: Medicare Other | Admitting: Family Medicine

## 2020-09-29 ENCOUNTER — Ambulatory Visit (INDEPENDENT_AMBULATORY_CARE_PROVIDER_SITE_OTHER): Payer: Medicare Other | Admitting: *Deleted

## 2020-09-29 DIAGNOSIS — Z5181 Encounter for therapeutic drug level monitoring: Secondary | ICD-10-CM

## 2020-09-29 DIAGNOSIS — I82621 Acute embolism and thrombosis of deep veins of right upper extremity: Secondary | ICD-10-CM | POA: Diagnosis not present

## 2020-09-29 LAB — POCT INR: INR: 2.8 (ref 2.0–3.0)

## 2020-09-30 ENCOUNTER — Encounter: Payer: Self-pay | Admitting: Family Medicine

## 2020-10-01 MED ORDER — FLUCONAZOLE 150 MG PO TABS: ORAL_TABLET | ORAL | 0 refills | Status: DC

## 2020-10-10 ENCOUNTER — Other Ambulatory Visit: Payer: Self-pay | Admitting: Family Medicine

## 2020-10-14 NOTE — Telephone Encounter (Signed)
90 day with 1 refill Needs to schedule ov for fall

## 2020-10-18 ENCOUNTER — Other Ambulatory Visit: Payer: Self-pay | Admitting: Family Medicine

## 2020-10-18 DIAGNOSIS — E1169 Type 2 diabetes mellitus with other specified complication: Secondary | ICD-10-CM

## 2020-10-19 NOTE — Telephone Encounter (Signed)
90-day with 1 refill 

## 2020-11-04 ENCOUNTER — Ambulatory Visit (INDEPENDENT_AMBULATORY_CARE_PROVIDER_SITE_OTHER): Payer: Medicare Other | Admitting: *Deleted

## 2020-11-04 ENCOUNTER — Encounter: Payer: Self-pay | Admitting: Cardiology

## 2020-11-04 ENCOUNTER — Ambulatory Visit (INDEPENDENT_AMBULATORY_CARE_PROVIDER_SITE_OTHER): Payer: Medicare Other | Admitting: Cardiology

## 2020-11-04 VITALS — BP 124/84 | HR 82 | Resp 18 | Ht 60.0 in | Wt 170.0 lb

## 2020-11-04 DIAGNOSIS — I10 Essential (primary) hypertension: Secondary | ICD-10-CM

## 2020-11-04 DIAGNOSIS — E782 Mixed hyperlipidemia: Secondary | ICD-10-CM | POA: Diagnosis not present

## 2020-11-04 DIAGNOSIS — I742 Embolism and thrombosis of arteries of the upper extremities: Secondary | ICD-10-CM | POA: Diagnosis not present

## 2020-11-04 DIAGNOSIS — R0600 Dyspnea, unspecified: Secondary | ICD-10-CM

## 2020-11-04 DIAGNOSIS — Z5181 Encounter for therapeutic drug level monitoring: Secondary | ICD-10-CM

## 2020-11-04 DIAGNOSIS — R0609 Other forms of dyspnea: Secondary | ICD-10-CM

## 2020-11-04 DIAGNOSIS — I70208 Unspecified atherosclerosis of native arteries of extremities, other extremity: Secondary | ICD-10-CM

## 2020-11-04 DIAGNOSIS — I82621 Acute embolism and thrombosis of deep veins of right upper extremity: Secondary | ICD-10-CM

## 2020-11-04 LAB — POCT INR: INR: 2.9 (ref 2.0–3.0)

## 2020-11-04 NOTE — Patient Instructions (Signed)
Continue warfarin 1/2 tablet daily except 1 tablet on Mondays and Fridays.   Recheck INR 6 weeks  

## 2020-11-04 NOTE — Patient Instructions (Addendum)

## 2020-11-04 NOTE — Progress Notes (Signed)
Clinical Summary Jacqueline Morgan is a 80 y.o.female seen today for follow up of the following medical problems.   1. Left brachial artery occlusion   - admit 12/2012 with left arm pain, found to have occlusion of the left brachial artery.   - underwent left arm thromboembolectomy 12/19/12 by Jacqueline Morgan   - echo was negative for thrombus. There was some question of possible aortic arch atherosclerosis. Discharged on coumadin.  - denies any bleeding issues on coumadin. Notes some occsaional pain in left arm with activity that is unchanged..   - has not been in favor of NOACs     No bleeding on coumadin     2. Hyperlipidemia 10/2017 TC 368 TG 374 HDL 36 LDL 257 9 /2020 TC 372 TG 392 LDL 248  - had tried crestor '5mg'$  every other day a few years ago, did not tolerate due to muscle aches. . She reports side effects on lipitor in the past. She is unsure of her other statin histoyr.     -. She reports palpitations on pravastatin and she stopped taking.    - she reports muscle aches on zetia, stopped taking - muscle aches on crestor twice a week - resistant to pcsk9 inhibitors  05/2020 TC 321 TG 299 HDL 37 LDL 222 - she is back on crestor '10mg'$  again, reports she is tolerating.    3. DOE - recent symptoms with short distances - chronic cough, wheezing she reports 2017 PFTs normal CXR no acute process BNP 34    10/2015 nuclear no ischemia.   08/2019 echo LVEF 60-65%, grade I DDx, normal RV  07/2019 CXR no acute process  SOB singing in church, showering.    - chronic SOB unchanged.  - no recent chest pains.   Past Medical History:  Diagnosis Date   Anxiety    Complication of anesthesia    passed out after dental surgery   Depression    Fibromyalgia    GERD (gastroesophageal reflux disease)    Hyperlipidemia    Hypertension    Palpitations    Tremor, essential 11/30/2015     Allergies  Allergen Reactions   Codeine     REACTION: Adverse gastrointestinal effects    Statins     Felt bad     Current Outpatient Medications  Medication Sig Dispense Refill   acetaminophen (TYLENOL) 500 MG tablet Take 1,000 mg by mouth every 8 (eight) hours as needed for moderate pain. For headache/pain      fluconazole (DIFLUCAN) 150 MG tablet Take one tablet po now and one tablet po three days later 2 tablet 0   LORazepam (ATIVAN) 1 MG tablet TAKE (1/2) TO (1) TABLET TWICE DAILY AS NEEDED. 60 tablet 5   metoprolol succinate (TOPROL-XL) 25 MG 24 hr tablet TAKE 1 TABLET BY MOUTH  DAILY WITH OR IMMEDIATELY  FOLLOWING A MEAL 90 tablet 1   mirtazapine (REMERON) 7.5 MG tablet Take 1 tablet (7.5 mg total) by mouth at bedtime. 30 tablet 5   pantoprazole (PROTONIX) 40 MG tablet TAKE 1 TABLET BY MOUTH  TWICE DAILY 180 tablet 0   PARoxetine (PAXIL) 20 MG tablet TAKE 1 TABLET BY MOUTH IN  THE MORNING AND 2 TABLETS  BY MOUTH IN THE EVENING 270 tablet 0   rosuvastatin (CRESTOR) 10 MG tablet TAKE 1 TABLET BY MOUTH  DAILY 90 tablet 1   warfarin (COUMADIN) 5 MG tablet TAKE 1/2 -1 TABLET BY MOUTH DAILY AS DIRECTED. 30 tablet  6   No current facility-administered medications for this visit.     Past Surgical History:  Procedure Laterality Date   COLONOSCOPY N/A 04/01/2015   Procedure: COLONOSCOPY;  Surgeon: Rogene Houston, MD;  [facility];  Service: Endoscopy;  Laterality: N/A;  1:45   COLONOSCOPY WITH PROPOFOL N/A 10/18/2019   Procedure: COLONOSCOPY WITH PROPOFOL;  Surgeon: Rogene Houston, MD;  Location: [facility];  Service: Endoscopy;  Laterality: N/A;  730   EMBOLECTOMY Left 12/19/2012   Procedure: EMBOLECTOMY OF LEFT BRACHIAL ARTERY;  Surgeon: Conrad Collinston, MD;  Location: [facility];  Service: Vascular;  Laterality: Left;   EYE SURGERY     POLYPECTOMY  10/18/2019   Procedure: POLYPECTOMY;  Surgeon: Rogene Houston, MD;  Location: [facility];  Service: Endoscopy;;     Allergies  Allergen Reactions   Codeine     REACTION: Adverse gastrointestinal effects    Statins     Felt bad      Family History  Problem Relation Age of Onset   Cancer Mother        ovarian, colon   Heart disease Mother    Heart attack Father    Glaucoma Father    COPD Sister    Benign prostatic hyperplasia Brother      Social History Jacqueline Morgan reports that she has never smoked. She has never used smokeless tobacco. Jacqueline Morgan reports no history of alcohol use.   Review of Systems CONSTITUTIONAL: No weight loss, fever, chills, weakness or fatigue.  HEENT: Eyes: No visual loss, blurred vision, double vision or yellow sclerae.No hearing loss, sneezing, congestion, runny nose or sore throat.  SKIN: No rash or itching.  CARDIOVASCULAR: per hpi RESPIRATORY: per hpi GASTROINTESTINAL: No anorexia, nausea, vomiting or diarrhea. No abdominal pain or blood.  GENITOURINARY: No burning on urination, no polyuria NEUROLOGICAL: No headache, dizziness, syncope, paralysis, ataxia, numbness or tingling in the extremities. No change in bowel or bladder control.  MUSCULOSKELETAL: No muscle, back pain, joint pain or stiffness.  LYMPHATICS: No enlarged nodes. No history of splenectomy.  PSYCHIATRIC: No history of depression or anxiety.  ENDOCRINOLOGIC: No reports of sweating, cold or heat intolerance. No polyuria or polydipsia.  [name]   Physical Examination Today's Vitals   11/04/20 1511  BP: 124/84  Pulse: 82  Resp: 18  SpO2: 96%  Weight: 170 lb (77.1 kg)  Height: 5' (1.524 m)  PainSc: 0-No pain   Body mass index is 33.2 kg/m.  Gen: resting comfortably, no acute distress HEENT: no scleral icterus, pupils equal round and reactive, no palptable cervical adenopathy,  CV: RRR, no m/r/g,no jvd Resp: Clear to auscultation bilaterally GI: abdomen is soft, non-tender, non-distended, normal bowel sounds, no hepatosplenomegaly MSK: extremities are warm, no edema.  Skin: warm, no rash Neuro:  no focal deficits Psych: appropriate affect   Diagnostic Studies 05/2013 21  day Event monitor No arrythmias   07/2015 echo Study Conclusions   - Left ventricle: The cavity size was normal. Wall thickness was   increased in a pattern of mild LVH. Systolic function was normal.   The estimated ejection fraction was in the range of 60% to 65%.   Wall motion was normal; there were no regional wall motion   abnormalities. Doppler parameters are consistent with abnormal   left ventricular relaxation (grade 1 diastolic dysfunction). - Aortic valve: Mildly calcified annulus. Trileaflet; mildly   thickened leaflets. Valve area (VTI): 2.21 cm^2. Valve area   (Vmax): 2.35  cm^2. - Mitral valve: Mildly calcified annulus. Mildly thickened leaflets   . - Technically adequate study.   07/2015 LUE Arterial US IMPRESSION: Left upper extremity arterial noninvasive study demonstrates no evidence of significant arterial occlusive disease.   10/2015 Lexiscan MPI There was no ST segment deviation noted during stress. The study is normal. There are no perfusion defects consistent with prior infarct or current ischemia. This is a low risk study. The left ventricular ejection fraction is normal (55-65%).   08/2019 echo IMPRESSIONS     1. Left ventricular ejection fraction, by estimation, is 60 to 65%. The  left ventricle has normal function. Left ventricular endocardial border  not optimally defined to evaluate regional wall motion. There is mild left  ventricular hypertrophy of the  posterior segment. Left ventricular diastolic parameters are consistent  with Grade I diastolic dysfunction (impaired relaxation).   2. Right ventricular systolic function is normal. The right ventricular  size is normal. There is normal pulmonary artery systolic pressure.   3. The mitral valve is degenerative. Trivial mitral valve regurgitation.   4. The aortic valve is tricuspid. Aortic valve regurgitation is not  visualized. No aortic stenosis is present.    Assessment and Plan  1. Left  brachial artery occlusion   - secondary to thromboembolism, unclear source of the embolism (cardiac vs aortic atheroscerlosis)   -she has not been interested in NOACs - she will continue coumadin     2. Hyperlipidemia -side effects on statins and zetia - turn down pcsk9 inhibitors -trying crestor again and tolerating at this time, continue   3. DOE - extensive cardaic testing over the years has been overall benign - no plans for repeat testing at this time, continue to monitor. Likely significant deconditioing, encouraged more regular physical activity.   EKG SR, rare PVCs  F/u 1 year    Arnoldo Lenis, M.D.

## 2020-11-09 ENCOUNTER — Other Ambulatory Visit: Payer: Self-pay | Admitting: Family Medicine

## 2020-11-09 ENCOUNTER — Encounter: Payer: Self-pay | Admitting: Family Medicine

## 2020-11-09 DIAGNOSIS — R5383 Other fatigue: Secondary | ICD-10-CM

## 2020-11-09 DIAGNOSIS — Z79899 Other long term (current) drug therapy: Secondary | ICD-10-CM

## 2020-11-09 DIAGNOSIS — E1169 Type 2 diabetes mellitus with other specified complication: Secondary | ICD-10-CM

## 2020-11-09 DIAGNOSIS — I10 Essential (primary) hypertension: Secondary | ICD-10-CM

## 2020-11-09 DIAGNOSIS — R7303 Prediabetes: Secondary | ICD-10-CM

## 2020-11-09 DIAGNOSIS — R7989 Other specified abnormal findings of blood chemistry: Secondary | ICD-10-CM

## 2020-11-09 NOTE — Telephone Encounter (Signed)
Lab orders placed; sent my chart message

## 2020-11-09 NOTE — Telephone Encounter (Signed)
Nurses see previous message Needs lipid liver metabolic 7 123456 urine ACR TSH CBC   Hyperlipidemia hypertension diabetes type 2, other fatigue, elevated platelet count, go ahead and schedule office visit for later this month to do labs before that visit

## 2020-11-09 NOTE — Telephone Encounter (Signed)
Lipid, liver, metabolic 7, 123456, urine ACR, TSH, CBC  Hyperlipidemia hypertension, diabetes type 2, other fatigue, elevated platelet count  Also go ahead and schedule an appointment please

## 2020-11-16 DIAGNOSIS — I10 Essential (primary) hypertension: Secondary | ICD-10-CM | POA: Diagnosis not present

## 2020-11-16 DIAGNOSIS — R7303 Prediabetes: Secondary | ICD-10-CM | POA: Diagnosis not present

## 2020-11-16 DIAGNOSIS — R5383 Other fatigue: Secondary | ICD-10-CM | POA: Diagnosis not present

## 2020-11-16 DIAGNOSIS — E1169 Type 2 diabetes mellitus with other specified complication: Secondary | ICD-10-CM | POA: Diagnosis not present

## 2020-11-16 DIAGNOSIS — Z79899 Other long term (current) drug therapy: Secondary | ICD-10-CM | POA: Diagnosis not present

## 2020-11-16 DIAGNOSIS — R7989 Other specified abnormal findings of blood chemistry: Secondary | ICD-10-CM | POA: Diagnosis not present

## 2020-11-16 DIAGNOSIS — E785 Hyperlipidemia, unspecified: Secondary | ICD-10-CM | POA: Diagnosis not present

## 2020-11-17 LAB — CBC WITH DIFFERENTIAL/PLATELET
Basophils Absolute: 0.1 10*3/uL (ref 0.0–0.2)
Basos: 1 %
EOS (ABSOLUTE): 0.2 10*3/uL (ref 0.0–0.4)
Eos: 2 %
Hematocrit: 41 % (ref 34.0–46.6)
Hemoglobin: 13.3 g/dL (ref 11.1–15.9)
Immature Grans (Abs): 0 10*3/uL (ref 0.0–0.1)
Immature Granulocytes: 0 %
Lymphocytes Absolute: 3.1 10*3/uL (ref 0.7–3.1)
Lymphs: 36 %
MCH: 28.1 pg (ref 26.6–33.0)
MCHC: 32.4 g/dL (ref 31.5–35.7)
MCV: 87 fL (ref 79–97)
Monocytes Absolute: 0.7 10*3/uL (ref 0.1–0.9)
Monocytes: 8 %
Neutrophils Absolute: 4.5 10*3/uL (ref 1.4–7.0)
Neutrophils: 53 %
Platelets: 376 10*3/uL (ref 150–450)
RBC: 4.74 x10E6/uL (ref 3.77–5.28)
RDW: 14.1 % (ref 11.7–15.4)
WBC: 8.6 10*3/uL (ref 3.4–10.8)

## 2020-11-17 LAB — LIPID PANEL
Chol/HDL Ratio: 3.9 ratio (ref 0.0–4.4)
Cholesterol, Total: 160 mg/dL (ref 100–199)
HDL: 41 mg/dL (ref >39)
LDL Chol Calc (NIH): 85 mg/dL (ref 0–99)
Triglycerides: 204 mg/dL — ABNORMAL HIGH (ref 0–149)
VLDL Cholesterol Cal: 34 mg/dL (ref 5–40)

## 2020-11-17 LAB — HEMOGLOBIN A1C
Est. average glucose Bld gHb Est-mCnc: 148 mg/dL
Hgb A1c MFr Bld: 6.8 % — ABNORMAL HIGH (ref 4.8–5.6)

## 2020-11-17 LAB — BASIC METABOLIC PANEL
BUN/Creatinine Ratio: 19 (ref 12–28)
BUN: 18 mg/dL (ref 8–27)
CO2: 18 mmol/L — ABNORMAL LOW (ref 20–29)
Calcium: 9.4 mg/dL (ref 8.7–10.3)
Chloride: 101 mmol/L (ref 96–106)
Creatinine, Ser: 0.97 mg/dL (ref 0.57–1.00)
Glucose: 133 mg/dL — ABNORMAL HIGH (ref 65–99)
Potassium: 4.8 mmol/L (ref 3.5–5.2)
Sodium: 138 mmol/L (ref 134–144)
eGFR: 59 mL/min/{1.73_m2} — ABNORMAL LOW (ref >59)

## 2020-11-17 LAB — HEPATIC FUNCTION PANEL
ALT: 16 IU/L (ref 0–32)
AST: 35 IU/L (ref 0–40)
Albumin: 4.1 g/dL (ref 3.7–4.7)
Alkaline Phosphatase: 124 IU/L — ABNORMAL HIGH (ref 44–121)
Bilirubin Total: 0.4 mg/dL (ref 0.0–1.2)
Bilirubin, Direct: 0.15 mg/dL (ref 0.00–0.40)
Total Protein: 7.1 g/dL (ref 6.0–8.5)

## 2020-11-17 LAB — MICROALBUMIN / CREATININE URINE RATIO
Creatinine, Urine: 124.2 mg/dL
Microalb/Creat Ratio: 5 mg/g creat (ref 0–29)
Microalbumin, Urine: 6.4 ug/mL

## 2020-11-17 LAB — TSH: TSH: 0.446 u[IU]/mL — ABNORMAL LOW (ref 0.450–4.500)

## 2020-11-19 DIAGNOSIS — Z1231 Encounter for screening mammogram for malignant neoplasm of breast: Secondary | ICD-10-CM | POA: Diagnosis not present

## 2020-11-19 LAB — HM MAMMOGRAPHY: HM Mammogram: NORMAL (ref 0–4)

## 2020-11-24 ENCOUNTER — Encounter: Payer: Self-pay | Admitting: Family Medicine

## 2020-12-05 ENCOUNTER — Telehealth: Payer: Self-pay

## 2020-12-05 NOTE — Telephone Encounter (Signed)
Spoke with patient and scheduled AWV for Wednesday Sept 7th at 530 PM.

## 2020-12-09 ENCOUNTER — Telehealth: Payer: Medicare Other

## 2020-12-16 ENCOUNTER — Ambulatory Visit (INDEPENDENT_AMBULATORY_CARE_PROVIDER_SITE_OTHER): Payer: Medicare Other | Admitting: *Deleted

## 2020-12-16 ENCOUNTER — Other Ambulatory Visit: Payer: Self-pay

## 2020-12-16 DIAGNOSIS — I82621 Acute embolism and thrombosis of deep veins of right upper extremity: Secondary | ICD-10-CM

## 2020-12-16 DIAGNOSIS — Z5181 Encounter for therapeutic drug level monitoring: Secondary | ICD-10-CM

## 2020-12-16 LAB — POCT INR: INR: 2.5 (ref 2.0–3.0)

## 2020-12-16 NOTE — Patient Instructions (Signed)
Continue warfarin 1/2 tablet daily except 1 tablet on Mondays and Fridays.   Recheck INR 6 weeks  

## 2020-12-18 ENCOUNTER — Telehealth: Payer: Self-pay | Admitting: Cardiology

## 2020-12-18 NOTE — Telephone Encounter (Signed)
What dental office are you calling from?  [facility]  What is your office phone number? [phone]   What is your fax number? [phone]   What type of procedure is the patient having performed? Extraction    What date is procedure scheduled or is the patient there now? TBD   What is your question (ex. Antibiotics prior to procedure, holding medication-we need to know how long dentist wants pt to hold med)? Please advise if patient needs to hold Warfarin and when to resume    (If the patient is currently at the dentist's office, call Pre-Op APP to address. If the patient is not currently in the dentist office, please route to the Pre-Op pool)

## 2020-12-21 ENCOUNTER — Telehealth: Payer: Self-pay | Admitting: Family Medicine

## 2020-12-21 NOTE — Telephone Encounter (Signed)
Tried to call to dental office to confirm how many teeth are being extracted. Dental office is closed. Will call again tomorrow.

## 2020-12-21 NOTE — Telephone Encounter (Signed)
Patient had labs back in mid August she was advised to do a follow-up office visit at that time.  She is now calling for that appointment.  I would recommend a follow-up visit within 4 weeks.  Thank you

## 2020-12-21 NOTE — Telephone Encounter (Signed)
Patient stated she needs an appointment with Dr Wolfgang Phoenix due to her labs

## 2020-12-21 NOTE — Telephone Encounter (Signed)
Please contact requesting office to verify details surrounding surgery and how many teeth are to be extracted.  Thank you.  Jossie Ng. Calogero Geisen NP-C    12/21/2020, 2:35 PM [address] Office [phone] Fax [phone]

## 2020-12-22 NOTE — Telephone Encounter (Signed)
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    Procedure: dental extractions, recontouring of bone Date of procedure: TBD  Per chart had left brachial artery occlusion with thromboembolectomy 12/2012  CrCl 45 (with adjusted body weight) Platelet count 376  Patient does not require pre-op antibiotics for dental procedure.  Per office protocol, patient can hold warfarin for 5 days prior to procedure.   Patient will not need bridging with Lovenox (enoxaparin) around procedure.  Patient should restart warfarin on the evening of procedure or day after, at discretion of procedure MD

## 2020-12-22 NOTE — Telephone Encounter (Signed)
Will route to pharm team for input then pt will need call.

## 2020-12-22 NOTE — Telephone Encounter (Signed)
Dr Mancel Parsons office faxed back  message stating that the patient is to have 6-8 teeth removed,  have recontouring of the bone , with IV sedation - moderate bleed risk  post surgically expected

## 2020-12-22 NOTE — Telephone Encounter (Signed)
Left message for dental office to call back with the # of how many teeth to be extracted, also need what type of anesthesia if any being used.

## 2020-12-23 NOTE — Telephone Encounter (Signed)
   Patient Name: Jacqueline Morgan  DOB: 08-Mar-1941 MRN: [id number]  Primary Cardiologist: Carlyle Dolly, MD  Chart reviewed as part of pre-operative protocol coverage.   I reached out to patient for update on how she is doing. The patient affirms she has been doing well without any new cardiac symptoms. Therefore, based on ACC/AHA guidelines, the patient would be at acceptable risk for the planned procedure without further cardiovascular testing. The patient was advised that if she develops new symptoms prior to surgery to contact our office to arrange for a follow-up visit, and she verbalized understanding.  SBE prophylaxis is not required for the patient from a cardiac standpoint.  Per pharmacist review, "Per office protocol, patient can hold warfarin for 5 days prior to procedure.  Patient will not need bridging with Lovenox (enoxaparin) around procedure. Patient should restart warfarin on the evening of procedure or day after, at discretion of procedure MD"   I will route this recommendation to the requesting party via [facility] fax function and remove from pre-op pool.  Please call with questions.  Charlie Pitter, PA-C 12/23/2020, 8:13 AM

## 2020-12-25 NOTE — Telephone Encounter (Signed)
Left message for patient. Scheduled appointment for 10/18 at 9:40 for 4 week follow up.   Sent myChart message with appointment information

## 2020-12-26 ENCOUNTER — Ambulatory Visit: Payer: Medicare Other

## 2020-12-30 ENCOUNTER — Other Ambulatory Visit: Payer: Self-pay | Admitting: Family Medicine

## 2021-01-10 ENCOUNTER — Other Ambulatory Visit: Payer: Self-pay | Admitting: Family Medicine

## 2021-01-13 ENCOUNTER — Other Ambulatory Visit: Payer: Self-pay | Admitting: Family Medicine

## 2021-01-14 ENCOUNTER — Telehealth: Payer: Self-pay | Admitting: Family Medicine

## 2021-01-14 MED ORDER — METOPROLOL SUCCINATE ER 25 MG PO TB24: ORAL_TABLET | ORAL | 1 refills | Status: DC

## 2021-01-14 NOTE — Telephone Encounter (Signed)
Pt contacted and made aware that metoprolol was sent to [facility] states she did not need metoprolol ;she needed Lorazepam sent to [facility]. Please advise. Thank you

## 2021-01-14 NOTE — Telephone Encounter (Signed)
Prescription was sent in Please make sure patient is aware she must do her follow-up visit to get more refills

## 2021-01-14 NOTE — Telephone Encounter (Signed)
Patient is requesting refill on metoprolol succinate 25 mg called into [place] it was [facility] instead she has been out of medication for 4 days now.

## 2021-01-15 NOTE — Telephone Encounter (Signed)
Patient is scheduled for 01/19/21

## 2021-01-19 ENCOUNTER — Encounter: Payer: Self-pay | Admitting: Family Medicine

## 2021-01-19 ENCOUNTER — Other Ambulatory Visit: Payer: Self-pay

## 2021-01-19 ENCOUNTER — Ambulatory Visit (INDEPENDENT_AMBULATORY_CARE_PROVIDER_SITE_OTHER): Payer: Medicare Other | Admitting: Family Medicine

## 2021-01-19 VITALS — BP 130/70 | HR 75 | Temp 97.1°F | Ht 60.0 in | Wt 170.0 lb

## 2021-01-19 DIAGNOSIS — R5383 Other fatigue: Secondary | ICD-10-CM

## 2021-01-19 DIAGNOSIS — E1169 Type 2 diabetes mellitus with other specified complication: Secondary | ICD-10-CM

## 2021-01-19 DIAGNOSIS — E785 Hyperlipidemia, unspecified: Secondary | ICD-10-CM

## 2021-01-19 DIAGNOSIS — R7989 Other specified abnormal findings of blood chemistry: Secondary | ICD-10-CM | POA: Diagnosis not present

## 2021-01-19 DIAGNOSIS — I70208 Unspecified atherosclerosis of native arteries of extremities, other extremity: Secondary | ICD-10-CM | POA: Diagnosis not present

## 2021-01-19 DIAGNOSIS — R7303 Prediabetes: Secondary | ICD-10-CM

## 2021-01-19 DIAGNOSIS — I10 Essential (primary) hypertension: Secondary | ICD-10-CM | POA: Diagnosis not present

## 2021-01-19 MED ORDER — METFORMIN HCL ER 500 MG PO TB24: 500.0000 mg | ORAL_TABLET | Freq: Every day | ORAL | 1 refills | Status: DC

## 2021-01-19 MED ORDER — LORAZEPAM 1 MG PO TABS: ORAL_TABLET | ORAL | 5 refills | Status: DC

## 2021-01-19 NOTE — Progress Notes (Signed)
   Subjective:    Patient ID: Jacqueline Morgan, female    DOB: 08-Nov-1940, 80 y.o.   MRN: [id number]  HPI Patient is here to over her lab results - most recent 11/16/20  Patient for blood pressure check up.  The patient does have hypertension.    Medication compliance-she relates compliance  Blood pressure control recently-states its been under good control  Dietary compliance-tries to avoid salty foods but does admit to a fair amount of sugary foods  Patient here for follow-up regarding cholesterol.    Diet-avoids fried foods  Compliance with medicine-takes her medicine regular basis  Side effects-denies side effects  Activity-minimal activity  Regular lab work regarding lipid and liver was checked and if needing additional labs was appropriately ordered  The patient was seen today as part of a comprehensive diabetic check up. Patient has diabetes  Compliance-does not do a good job with diet in regards to diabetes Low sugars-denies any low sugars Dietary effort-too many sweets Foot exam and ophthalmology exam requirements were reviewed    Review of Systems     Objective:   Physical Exam General-in no acute distress Eyes-no discharge Lungs-respiratory rate normal, CTA CV-no murmurs,RRR Extremities skin warm dry no edema Neuro grossly normal Behavior normal, alert  Patient defers flu vaccine      Assessment & Plan:  1. Essential hypertension Blood pressure good control continue current measures healthy diet recommended - Basic metabolic panel - Hemoglobin A1c  2. Hyperlipidemia associated with type 2 diabetes mellitus ([place]) Diabetes not under good control A1c is going up recommend metformin extended release patient will let us know if any problems check lab in 3 months with follow-up office visit - Basic metabolic panel - Hemoglobin A1c She was encouraged to minimize sugars in her diet Patient encouraged to get eye exam

## 2021-01-26 ENCOUNTER — Ambulatory Visit (INDEPENDENT_AMBULATORY_CARE_PROVIDER_SITE_OTHER): Payer: Medicare Other | Admitting: *Deleted

## 2021-01-26 DIAGNOSIS — Z5181 Encounter for therapeutic drug level monitoring: Secondary | ICD-10-CM | POA: Diagnosis not present

## 2021-01-26 DIAGNOSIS — I82621 Acute embolism and thrombosis of deep veins of right upper extremity: Secondary | ICD-10-CM

## 2021-01-26 LAB — POCT INR: INR: 2.4 (ref 2.0–3.0)

## 2021-01-26 NOTE — Patient Instructions (Signed)
Description   Continue warfarin 1/2 tablet daily except 1 tablet on Mondays and Fridays.   Recheck INR 6 weeks

## 2021-02-01 ENCOUNTER — Encounter: Payer: Self-pay | Admitting: Family Medicine

## 2021-02-02 MED ORDER — BLOOD GLUCOSE METER KIT: PACK | 0 refills | Status: DC

## 2021-02-02 NOTE — Telephone Encounter (Signed)
Nurses please work with her to gather a glucometer and strips auto lancets etc. May test once daily  But currently right now I would recommend for the patient to check occasionally in the morning and occasionally before supper or bedtime no more than once per day more than likely if she just checks her sugars randomly 3 times a week that would work well she could always send [place] some readings in a few weeks time

## 2021-02-02 NOTE — Addendum Note (Signed)
Addended by: Vicente Males on: 02/02/2021 10:27 AM   Modules accepted: Orders

## 2021-02-04 ENCOUNTER — Encounter: Payer: Self-pay | Admitting: Family Medicine

## 2021-02-05 ENCOUNTER — Other Ambulatory Visit: Payer: Self-pay | Admitting: *Deleted

## 2021-02-05 ENCOUNTER — Other Ambulatory Visit: Payer: Self-pay

## 2021-02-05 ENCOUNTER — Telehealth: Payer: Self-pay | Admitting: Family Medicine

## 2021-02-05 ENCOUNTER — Encounter: Payer: Self-pay | Admitting: Family Medicine

## 2021-02-05 MED ORDER — BLOOD GLUCOSE METER KIT: PACK | 0 refills | Status: AC

## 2021-02-05 MED ORDER — MIRTAZAPINE 7.5 MG PO TABS: 7.5000 mg | ORAL_TABLET | Freq: Every day | ORAL | 0 refills | Status: DC

## 2021-02-05 NOTE — Telephone Encounter (Signed)
Script written out and signed by Hoyle Sauer. Pt is aware. Script up front for pick up

## 2021-02-05 NOTE — Telephone Encounter (Signed)
Pt having problems filling prescription for diabetic supplies. [facility] will not fill. She would like a written script so she could take it and price shop.Please advise.  [phone]

## 2021-03-09 ENCOUNTER — Ambulatory Visit (INDEPENDENT_AMBULATORY_CARE_PROVIDER_SITE_OTHER): Payer: Medicare Other | Admitting: *Deleted

## 2021-03-09 DIAGNOSIS — I82621 Acute embolism and thrombosis of deep veins of right upper extremity: Secondary | ICD-10-CM

## 2021-03-09 DIAGNOSIS — Z5181 Encounter for therapeutic drug level monitoring: Secondary | ICD-10-CM

## 2021-03-09 LAB — POCT INR: INR: 2.9 (ref 2.0–3.0)

## 2021-03-09 NOTE — Patient Instructions (Signed)
Continue warfarin 1/2 tablet daily except 1 tablet on Mondays and Fridays.   Recheck INR 6 weeks  

## 2021-03-23 ENCOUNTER — Other Ambulatory Visit: Payer: Self-pay | Admitting: Nurse Practitioner

## 2021-03-25 ENCOUNTER — Other Ambulatory Visit: Payer: Self-pay | Admitting: Family Medicine

## 2021-03-26 ENCOUNTER — Other Ambulatory Visit: Payer: Self-pay

## 2021-03-26 MED ORDER — ONETOUCH VERIO VI STRP: ORAL_STRIP | 5 refills | Status: DC

## 2021-04-02 ENCOUNTER — Other Ambulatory Visit: Payer: Self-pay | Admitting: Family Medicine

## 2021-04-02 DIAGNOSIS — E785 Hyperlipidemia, unspecified: Secondary | ICD-10-CM

## 2021-04-06 DIAGNOSIS — H02831 Dermatochalasis of right upper eyelid: Secondary | ICD-10-CM | POA: Diagnosis not present

## 2021-04-06 DIAGNOSIS — H401122 Primary open-angle glaucoma, left eye, moderate stage: Secondary | ICD-10-CM | POA: Diagnosis not present

## 2021-04-06 DIAGNOSIS — Z961 Presence of intraocular lens: Secondary | ICD-10-CM | POA: Diagnosis not present

## 2021-04-06 DIAGNOSIS — H401111 Primary open-angle glaucoma, right eye, mild stage: Secondary | ICD-10-CM | POA: Diagnosis not present

## 2021-04-06 DIAGNOSIS — H02834 Dermatochalasis of left upper eyelid: Secondary | ICD-10-CM | POA: Diagnosis not present

## 2021-04-08 ENCOUNTER — Telehealth: Payer: Self-pay | Admitting: Family Medicine

## 2021-04-08 NOTE — Telephone Encounter (Signed)
°  Left message for patient to call back and schedule Medicare Annual Wellness Visit (AWV) in office.  ° °If unable to come into the office for AWV,  please offer to do virtually or by telephone. ° °No hx of AWV eligible for AWVI as of 04/04/2009 per [facility]  ° °Please schedule at anytime with [facility]-Nurse Health Advisor.     ° °40 Minutes appointment  ° °Any questions, please call me at [phone]   °

## 2021-04-19 DIAGNOSIS — I10 Essential (primary) hypertension: Secondary | ICD-10-CM | POA: Diagnosis not present

## 2021-04-19 DIAGNOSIS — E785 Hyperlipidemia, unspecified: Secondary | ICD-10-CM | POA: Diagnosis not present

## 2021-04-19 DIAGNOSIS — E1169 Type 2 diabetes mellitus with other specified complication: Secondary | ICD-10-CM | POA: Diagnosis not present

## 2021-04-20 ENCOUNTER — Ambulatory Visit (INDEPENDENT_AMBULATORY_CARE_PROVIDER_SITE_OTHER): Payer: Medicare Other | Admitting: *Deleted

## 2021-04-20 DIAGNOSIS — Z5181 Encounter for therapeutic drug level monitoring: Secondary | ICD-10-CM | POA: Diagnosis not present

## 2021-04-20 DIAGNOSIS — I82621 Acute embolism and thrombosis of deep veins of right upper extremity: Secondary | ICD-10-CM

## 2021-04-20 LAB — BASIC METABOLIC PANEL
BUN/Creatinine Ratio: 13 (ref 12–28)
BUN: 16 mg/dL (ref 8–27)
CO2: 18 mmol/L — ABNORMAL LOW (ref 20–29)
Calcium: 9.4 mg/dL (ref 8.7–10.3)
Chloride: 101 mmol/L (ref 96–106)
Creatinine, Ser: 1.21 mg/dL — ABNORMAL HIGH (ref 0.57–1.00)
Glucose: 101 mg/dL — ABNORMAL HIGH (ref 70–99)
Potassium: 5.1 mmol/L (ref 3.5–5.2)
Sodium: 141 mmol/L (ref 134–144)
eGFR: 45 mL/min/{1.73_m2} — ABNORMAL LOW (ref >59)

## 2021-04-20 LAB — POCT INR: INR: 1.8 — AB (ref 2.0–3.0)

## 2021-04-20 LAB — HEMOGLOBIN A1C
Est. average glucose Bld gHb Est-mCnc: 140 mg/dL
Hgb A1c MFr Bld: 6.5 % — ABNORMAL HIGH (ref 4.8–5.6)

## 2021-04-20 NOTE — Patient Instructions (Signed)
Take warfarin 1 tablet tonight then resume 1/2 tablet daily except 1 tablet on Mondays and Fridays.   Recheck INR 6 weeks

## 2021-04-20 NOTE — Addendum Note (Signed)
Addended by: Dairl Ponder on: 04/20/2021 09:06 AM   Modules accepted: Orders

## 2021-05-10 DIAGNOSIS — R7989 Other specified abnormal findings of blood chemistry: Secondary | ICD-10-CM | POA: Diagnosis not present

## 2021-05-11 LAB — BASIC METABOLIC PANEL
BUN/Creatinine Ratio: 14 (ref 12–28)
BUN: 16 mg/dL (ref 8–27)
CO2: 21 mmol/L (ref 20–29)
Calcium: 9.6 mg/dL (ref 8.7–10.3)
Chloride: 101 mmol/L (ref 96–106)
Creatinine, Ser: 1.13 mg/dL — ABNORMAL HIGH (ref 0.57–1.00)
Glucose: 84 mg/dL (ref 70–99)
Potassium: 4.5 mmol/L (ref 3.5–5.2)
Sodium: 139 mmol/L (ref 134–144)
eGFR: 49 mL/min/{1.73_m2} — ABNORMAL LOW (ref >59)

## 2021-05-11 LAB — MICROALBUMIN / CREATININE URINE RATIO
Creatinine, Urine: 32.7 mg/dL
Microalb/Creat Ratio: <9 mg/g creat (ref 0–29)
Microalbumin, Urine: <3 ug/mL

## 2021-05-12 ENCOUNTER — Encounter: Payer: Self-pay | Admitting: Family Medicine

## 2021-05-12 NOTE — Addendum Note (Signed)
Addended by: Dairl Ponder on: 05/12/2021 09:21 AM   Modules accepted: Orders

## 2021-05-14 ENCOUNTER — Encounter: Payer: Self-pay | Admitting: Family Medicine

## 2021-05-14 MED ORDER — ONETOUCH VERIO VI STRP: ORAL_STRIP | 5 refills | Status: DC

## 2021-05-18 ENCOUNTER — Other Ambulatory Visit: Payer: Self-pay

## 2021-05-18 DIAGNOSIS — E1169 Type 2 diabetes mellitus with other specified complication: Secondary | ICD-10-CM

## 2021-05-18 DIAGNOSIS — E785 Hyperlipidemia, unspecified: Secondary | ICD-10-CM

## 2021-05-18 DIAGNOSIS — R7303 Prediabetes: Secondary | ICD-10-CM

## 2021-05-18 MED ORDER — ONETOUCH DELICA PLUS LANCET33G MISC: 5 refills | Status: AC

## 2021-05-18 MED ORDER — ONETOUCH VERIO VI STRP: ORAL_STRIP | 5 refills | Status: AC

## 2021-06-01 ENCOUNTER — Ambulatory Visit (INDEPENDENT_AMBULATORY_CARE_PROVIDER_SITE_OTHER): Payer: Medicare Other | Admitting: *Deleted

## 2021-06-01 DIAGNOSIS — Z5181 Encounter for therapeutic drug level monitoring: Secondary | ICD-10-CM | POA: Diagnosis not present

## 2021-06-01 DIAGNOSIS — I82621 Acute embolism and thrombosis of deep veins of right upper extremity: Secondary | ICD-10-CM | POA: Diagnosis not present

## 2021-06-01 LAB — POCT INR: INR: 1.9 — AB (ref 2.0–3.0)

## 2021-06-01 NOTE — Patient Instructions (Signed)
Continue warfarin 1/2 tablet daily except 1 tablet on Mondays and Fridays.   Has dental extractions 2/21.  Was off warfarin 6 days.  Just restarted 05/26/21. Recheck INR 3 weeks

## 2021-06-10 ENCOUNTER — Other Ambulatory Visit: Payer: Self-pay | Admitting: Family Medicine

## 2021-06-15 ENCOUNTER — Other Ambulatory Visit: Payer: Self-pay | Admitting: Family Medicine

## 2021-06-16 ENCOUNTER — Telehealth: Payer: Self-pay | Admitting: Family Medicine

## 2021-06-16 ENCOUNTER — Other Ambulatory Visit: Payer: Self-pay | Admitting: Family Medicine

## 2021-06-16 NOTE — Telephone Encounter (Signed)
We will send a refill prescription but we will also have the front notify the patient to schedule an office visit ?

## 2021-06-16 NOTE — Telephone Encounter (Signed)
Prescriptions sent electronically to pharmacy- Please schedule patient medication follow up with Dr Nicki Reaper in April. ?

## 2021-06-16 NOTE — Telephone Encounter (Signed)
Prescriptions sent electronically to pharmacy. Patient advised she needs follow up office visit. ?

## 2021-06-16 NOTE — Telephone Encounter (Signed)
Patient is requesting refills of Paxil and Protonix ?#1 she needs an office visit ?#2 please call patient to schedule office visit for April ?#3 May send in 90 days to the pharmacy of her choice ?

## 2021-06-22 ENCOUNTER — Ambulatory Visit (INDEPENDENT_AMBULATORY_CARE_PROVIDER_SITE_OTHER): Payer: Medicare Other | Admitting: *Deleted

## 2021-06-22 DIAGNOSIS — Z5181 Encounter for therapeutic drug level monitoring: Secondary | ICD-10-CM | POA: Diagnosis not present

## 2021-06-22 DIAGNOSIS — I82621 Acute embolism and thrombosis of deep veins of right upper extremity: Secondary | ICD-10-CM

## 2021-06-22 LAB — POCT INR: INR: 2.4 (ref 2.0–3.0)

## 2021-06-22 NOTE — Patient Instructions (Signed)
Continue warfarin 1/2 tablet daily except 1 tablet on Mondays and Fridays.   ?Recheck INR 4 weeks  ?

## 2021-06-23 ENCOUNTER — Telehealth: Payer: Self-pay | Admitting: Family Medicine

## 2021-06-23 NOTE — Telephone Encounter (Signed)
Patient stated she will check with Dr Wolfgang Phoenix to see if he wanted her AWV completed with [facility] ?

## 2021-06-24 ENCOUNTER — Other Ambulatory Visit: Payer: Self-pay | Admitting: Family Medicine

## 2021-06-24 DIAGNOSIS — E1169 Type 2 diabetes mellitus with other specified complication: Secondary | ICD-10-CM

## 2021-07-14 ENCOUNTER — Ambulatory Visit (INDEPENDENT_AMBULATORY_CARE_PROVIDER_SITE_OTHER): Payer: Medicare Other | Admitting: Family Medicine

## 2021-07-14 VITALS — BP 138/86 | HR 72 | Temp 98.4°F | Ht 60.0 in | Wt 161.0 lb

## 2021-07-14 DIAGNOSIS — E785 Hyperlipidemia, unspecified: Secondary | ICD-10-CM | POA: Diagnosis not present

## 2021-07-14 DIAGNOSIS — I10 Essential (primary) hypertension: Secondary | ICD-10-CM | POA: Diagnosis not present

## 2021-07-14 DIAGNOSIS — I872 Venous insufficiency (chronic) (peripheral): Secondary | ICD-10-CM

## 2021-07-14 DIAGNOSIS — E1169 Type 2 diabetes mellitus with other specified complication: Secondary | ICD-10-CM | POA: Diagnosis not present

## 2021-07-14 DIAGNOSIS — E119 Type 2 diabetes mellitus without complications: Secondary | ICD-10-CM

## 2021-07-14 DIAGNOSIS — I7389 Other specified peripheral vascular diseases: Secondary | ICD-10-CM | POA: Diagnosis not present

## 2021-07-14 MED ORDER — LORAZEPAM 1 MG PO TABS: ORAL_TABLET | ORAL | 5 refills | Status: DC

## 2021-07-14 MED ORDER — PAROXETINE HCL 20 MG PO TABS: ORAL_TABLET | ORAL | 1 refills | Status: DC

## 2021-07-14 MED ORDER — PANTOPRAZOLE SODIUM 40 MG PO TBEC
40.0000 mg | DELAYED_RELEASE_TABLET | Freq: Two times a day (BID) | ORAL | 1 refills | Status: DC
Start: 2021-07-14 — End: 2021-11-24

## 2021-07-14 MED ORDER — METFORMIN HCL ER 500 MG PO TB24: 500.0000 mg | ORAL_TABLET | Freq: Every day | ORAL | 1 refills | Status: DC

## 2021-07-14 MED ORDER — ROSUVASTATIN CALCIUM 10 MG PO TABS: 10.0000 mg | ORAL_TABLET | Freq: Every day | ORAL | 3 refills | Status: DC

## 2021-07-14 MED ORDER — METOPROLOL SUCCINATE ER 25 MG PO TB24
ORAL_TABLET | ORAL | 3 refills | Status: DC
Start: 2021-07-14 — End: 2022-05-16

## 2021-07-14 NOTE — Progress Notes (Signed)
? ?  Subjective:  ? ? Patient ID: Jacqueline Morgan, female    DOB: [birth year], 81 y.o.   MRN: [id number] ? ?Diabetes ?She presents for her follow-up diabetic visit. She has type 2 diabetes mellitus. Current diabetic treatments: metformin.  ?Hyperlipidemia ?This is a chronic problem. Treatments tried: rosuvastatin.  ?Poor Circulation of hands and feet concerns-  ?Patient is concerned that her hands and her feet turned purplish often later in the day ?They also can also tend to occur when she gets chilled or cold ?She does use lorazepam at nighttime to help her rest ?Takes her metformin on a regular basis ?Tries minimize sweets ?Does take metoprolol ?Also takes her Paxil on a regular basis her anxiety she states is stable ?She does take her rosuvastatin on a regular basis. ? ?Review of Systems ? ?   ?Objective:  ? Physical Exam ? ?General-in no acute distress ?Eyes-no discharge ?Lungs-respiratory rate normal, CTA ?CV-no murmurs,RRR ?Extremities skin warm dry no edema ?Neuro grossly normal ?Behavior normal, alert ? ? ? ?   ?Assessment & Plan:  ?1. Hyperlipidemia associated with type 2 diabetes mellitus ([place]) ?Cholesterol good control on previous lab work check new lab work take medication watch diet ?- rosuvastatin (CRESTOR) 10 MG tablet; Take 1 tablet (10 mg total) by mouth daily.  Dispense: 90 tablet; Refill: 3 ?- Hemoglobin E5I ?- Basic metabolic panel ?- Lipid panel ?- Hepatic function panel ?- CBC with Differential/Platelet ? 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 ?3. Venous insufficiency ?If this gets worse to let us know ?To wear socks on a regular basis ? ?- Hemoglobin E4M ?- Basic metabolic panel ?- Lipid panel ?- Hepatic function panel ?- CBC with Differential/Platelet ?- US ARTERIAL ABI (SCREENING LOWER EXTREMITY) ? ?4. Essential hypertension ?Blood pressure overall doing well continue current measures ?- Hemoglobin P5T ?- Basic metabolic panel ?- Lipid panel ?- Hepatic function panel ?- CBC with Differential/Platelet ? ?5. Diabetes mellitus without complication ([place]) ?Diabetes doing well on the metformin continue metformin continue healthy diet regular activity ?- Hemoglobin I1W ?- Basic metabolic panel ?- Lipid panel ?- Hepatic function panel ?- CBC with Differential/Platelet ?Patient states she is getting eye exam next month she is to have the results sent to [place] ?Shingles vaccine recommended patient defers ?Follow-up within 6 months ?Pneumonia vaccine recommended patient defers ?

## 2021-07-14 NOTE — Patient Instructions (Signed)
Hi Jacqueline Morgan ? ?It was good to see you today. ? ?We did refill your medications. ? ?We will be setting you up for a test that looks at circulation-it is a screening test.  If it comes back looking good then no further tests are needed. ?It is best to keep your hands and feet warm to minimize venous insufficiency.  Should this progressively get worse please follow-up sooner otherwise I would like to see you in the fall. ?Please consider wearing socks to keep your feet warm. ? ?Also as we discussed-pneumonia vaccine is recommended-if you decide differently let us know. ? ?Please do your blood work somewhere between now and the start of June. ?If you have any problems or questions let us know. ?TakeCare-Dr. Nicki Reaper ?

## 2021-07-20 ENCOUNTER — Ambulatory Visit (INDEPENDENT_AMBULATORY_CARE_PROVIDER_SITE_OTHER): Payer: Medicare Other | Admitting: *Deleted

## 2021-07-20 DIAGNOSIS — Z5181 Encounter for therapeutic drug level monitoring: Secondary | ICD-10-CM

## 2021-07-20 DIAGNOSIS — I82621 Acute embolism and thrombosis of deep veins of right upper extremity: Secondary | ICD-10-CM | POA: Diagnosis not present

## 2021-07-20 LAB — POCT INR: INR: 2.2 (ref 2.0–3.0)

## 2021-07-20 NOTE — Patient Instructions (Signed)
Continue warfarin 1/2 tablet daily except 1 tablet on Mondays and Fridays.   Recheck INR 6 weeks  

## 2021-07-23 ENCOUNTER — Ambulatory Visit (HOSPITAL_COMMUNITY)
Admission: RE | Admit: 2021-07-23 | Discharge: 2021-07-23 | Disposition: A | Discharge status: Home / Self Care | Payer: Medicare Other | Source: Ambulatory Visit | Attending: Family Medicine | Admitting: Family Medicine

## 2021-07-23 DIAGNOSIS — I7389 Other specified peripheral vascular diseases: Secondary | ICD-10-CM | POA: Insufficient documentation

## 2021-07-23 DIAGNOSIS — I872 Venous insufficiency (chronic) (peripheral): Secondary | ICD-10-CM | POA: Diagnosis not present

## 2021-07-26 ENCOUNTER — Other Ambulatory Visit: Payer: Self-pay | Admitting: *Deleted

## 2021-07-26 DIAGNOSIS — I872 Venous insufficiency (chronic) (peripheral): Secondary | ICD-10-CM

## 2021-08-09 ENCOUNTER — Other Ambulatory Visit: Payer: Self-pay | Admitting: Cardiology

## 2021-08-12 ENCOUNTER — Encounter: Payer: Self-pay | Admitting: Family Medicine

## 2021-08-12 DIAGNOSIS — I872 Venous insufficiency (chronic) (peripheral): Secondary | ICD-10-CM

## 2021-08-13 NOTE — Telephone Encounter (Signed)
Message sent to referral coorrdinator to follow up on appt ?

## 2021-08-13 NOTE — Telephone Encounter (Signed)
Nurses ?Referral was sent to referral coordinator ?This referral was forwarded to Dr. Donnetta Hutching office ? ?Please inform patient of this. ?Also please give patient information regarding Dr. Luther Parody office-[name] can call their office and reference this referral and they should be able to help her go ahead and set up an appointment ? ?If for some reason they are not helping her get an appointment for her to let me know and I will call personally thank you ? ?

## 2021-08-17 DIAGNOSIS — E785 Hyperlipidemia, unspecified: Secondary | ICD-10-CM | POA: Diagnosis not present

## 2021-08-17 DIAGNOSIS — I7389 Other specified peripheral vascular diseases: Secondary | ICD-10-CM | POA: Diagnosis not present

## 2021-08-17 DIAGNOSIS — I872 Venous insufficiency (chronic) (peripheral): Secondary | ICD-10-CM | POA: Diagnosis not present

## 2021-08-17 DIAGNOSIS — I10 Essential (primary) hypertension: Secondary | ICD-10-CM | POA: Diagnosis not present

## 2021-08-17 DIAGNOSIS — E1169 Type 2 diabetes mellitus with other specified complication: Secondary | ICD-10-CM | POA: Diagnosis not present

## 2021-08-17 DIAGNOSIS — E119 Type 2 diabetes mellitus without complications: Secondary | ICD-10-CM | POA: Diagnosis not present

## 2021-08-18 LAB — CBC WITH DIFFERENTIAL/PLATELET
Basophils Absolute: 0.1 10*3/uL (ref 0.0–0.2)
Basos: 1 %
EOS (ABSOLUTE): 0.2 10*3/uL (ref 0.0–0.4)
Eos: 3 %
Hematocrit: 42 % (ref 34.0–46.6)
Hemoglobin: 13.7 g/dL (ref 11.1–15.9)
Immature Grans (Abs): 0 10*3/uL (ref 0.0–0.1)
Immature Granulocytes: 0 %
Lymphocytes Absolute: 2.7 10*3/uL (ref 0.7–3.1)
Lymphs: 35 %
MCH: 28.7 pg (ref 26.6–33.0)
MCHC: 32.6 g/dL (ref 31.5–35.7)
MCV: 88 fL (ref 79–97)
Monocytes Absolute: 0.6 10*3/uL (ref 0.1–0.9)
Monocytes: 8 %
Neutrophils Absolute: 4.2 10*3/uL (ref 1.4–7.0)
Neutrophils: 53 %
Platelets: 333 10*3/uL (ref 150–450)
RBC: 4.77 x10E6/uL (ref 3.77–5.28)
RDW: 14 % (ref 11.7–15.4)
WBC: 7.8 10*3/uL (ref 3.4–10.8)

## 2021-08-18 LAB — LIPID PANEL
Chol/HDL Ratio: 3.2 ratio (ref 0.0–4.4)
Cholesterol, Total: 154 mg/dL (ref 100–199)
HDL: 48 mg/dL (ref >39)
LDL Chol Calc (NIH): 79 mg/dL (ref 0–99)
Triglycerides: 159 mg/dL — ABNORMAL HIGH (ref 0–149)
VLDL Cholesterol Cal: 27 mg/dL (ref 5–40)

## 2021-08-18 LAB — HEPATIC FUNCTION PANEL
ALT: 13 IU/L (ref 0–32)
AST: 25 IU/L (ref 0–40)
Albumin: 4 g/dL (ref 3.7–4.7)
Alkaline Phosphatase: 103 IU/L (ref 44–121)
Bilirubin Total: 0.3 mg/dL (ref 0.0–1.2)
Bilirubin, Direct: 0.1 mg/dL (ref 0.00–0.40)
Total Protein: 7.4 g/dL (ref 6.0–8.5)

## 2021-08-18 LAB — BASIC METABOLIC PANEL
BUN/Creatinine Ratio: 13 (ref 12–28)
BUN: 14 mg/dL (ref 8–27)
CO2: 20 mmol/L (ref 20–29)
Calcium: 9.4 mg/dL (ref 8.7–10.3)
Chloride: 103 mmol/L (ref 96–106)
Creatinine, Ser: 1.06 mg/dL — ABNORMAL HIGH (ref 0.57–1.00)
Glucose: 101 mg/dL — ABNORMAL HIGH (ref 70–99)
Potassium: 4.5 mmol/L (ref 3.5–5.2)
Sodium: 141 mmol/L (ref 134–144)
eGFR: 53 mL/min/{1.73_m2} — ABNORMAL LOW (ref >59)

## 2021-08-18 LAB — HEMOGLOBIN A1C
Est. average glucose Bld gHb Est-mCnc: 140 mg/dL
Hgb A1c MFr Bld: 6.5 % — ABNORMAL HIGH (ref 4.8–5.6)

## 2021-08-18 NOTE — Telephone Encounter (Signed)
Nurses-it is true that Dr. Luther Parody office does not typically deal with things like varicose veins but this patient's situation is different this patient has what I believe is severe venous insufficiency but at the same time I am wanting to confirm that her arterial circulation to her legs is adequate because of poor pulses and her feet being cyanotic.-Please call Dr. Luther Parody office talk with a clinical person and explained the situation I was hoping that Dr. Luther Parody office would see her in at least make sure that her arterial blood flow to her legs is doing well then if it is not something that they can do anything more for we would be satisfied with that opinion and could always connect with another specialist if we need to.  Once again hopefully Dr. Luther Parody office would agree to see the patient given her vascular history-keep me abreast of the progress thank you ?

## 2021-08-18 NOTE — Telephone Encounter (Signed)
Left message to return call with referral coordinator at Dr Luther Parody office ?

## 2021-08-19 DIAGNOSIS — H401131 Primary open-angle glaucoma, bilateral, mild stage: Secondary | ICD-10-CM | POA: Diagnosis not present

## 2021-08-19 LAB — HM DIABETES EYE EXAM

## 2021-08-26 NOTE — Telephone Encounter (Signed)
Please go ahead with referral to vascular and vein in [facility] thank you

## 2021-08-31 ENCOUNTER — Ambulatory Visit (INDEPENDENT_AMBULATORY_CARE_PROVIDER_SITE_OTHER): Payer: Medicare Other | Admitting: *Deleted

## 2021-08-31 DIAGNOSIS — Z5181 Encounter for therapeutic drug level monitoring: Secondary | ICD-10-CM

## 2021-08-31 DIAGNOSIS — I82621 Acute embolism and thrombosis of deep veins of right upper extremity: Secondary | ICD-10-CM

## 2021-08-31 LAB — POCT INR: INR: 2.8 (ref 2.0–3.0)

## 2021-08-31 NOTE — Patient Instructions (Signed)
Continue warfarin 1/2 tablet daily except 1 tablet on Mondays and Fridays.   Recheck INR 6 weeks  

## 2021-09-13 ENCOUNTER — Telehealth: Payer: Medicare Other | Admitting: Emergency Medicine

## 2021-09-13 DIAGNOSIS — J029 Acute pharyngitis, unspecified: Secondary | ICD-10-CM | POA: Diagnosis not present

## 2021-09-13 NOTE — Progress Notes (Signed)
E-Visit for Sore Throat  We are sorry that you are not feeling well.  Here is how we plan to help!  Your symptoms indicate a likely viral infection (Pharyngitis).   Pharyngitis is inflammation in the back of the throat which can cause a sore throat, scratchiness and sometimes difficulty swallowing.   Pharyngitis is typically caused by a respiratory virus and will just run its course.  Please keep in mind that your symptoms could last up to 10 days.  For throat pain, we recommend over the counter oral pain relief medications such as acetaminophen or aspirin, or anti-inflammatory medications such as ibuprofen or naproxen sodium.  Topical treatments such as oral throat lozenges or sprays may be used as needed.  Avoid close contact with loved ones, especially the very young and elderly.  Remember to wash your hands thoroughly throughout the day as this is the number one way to prevent the spread of infection and wipe down door knobs and counters with disinfectant.  After careful review of your answers, I would not recommend an antibiotic for your condition.  Antibiotics should not be used to treat conditions that we suspect are caused by viruses like the virus that causes the common cold or flu. However, some people can have Strep with atypical symptoms. You may need formal testing in clinic or office to confirm if your symptoms continue or worsen.  Providers prescribe antibiotics to treat infections caused by bacteria. Antibiotics are very powerful in treating bacterial infections when they are used properly.  To maintain their effectiveness, they should be used only when necessary.  Overuse of antibiotics has resulted in the development of super bugs that are resistant to treatment!    Home Care: Only take medications as instructed by your medical team. Do not drink alcohol while taking these medications. A steam or ultrasonic humidifier can help congestion.  You can place a towel over your head and  breathe in the steam from hot water coming from a faucet. Avoid close contacts especially the very young and the elderly. Cover your mouth when you cough or sneeze. Always remember to wash your hands.  Get Help Right Away If: You develop worsening fever or throat pain. You develop a severe head ache or visual changes. Your symptoms persist after you have completed your treatment plan.  Make sure you Understand these instructions. Will watch your condition. Will get help right away if you are not doing well or get worse.   Thank you for choosing an e-visit.  Your e-visit answers were reviewed by a board certified advanced clinical practitioner to complete your personal care plan. Depending upon the condition, your plan could have included both over the counter or prescription medications.  Please review your pharmacy choice. Make sure the pharmacy is open so you can pick up prescription now. If there is a problem, you may contact your provider through MyChart messaging and have the prescription routed to another pharmacy.  Your safety is important to us. If you have drug allergies check your prescription carefully.   For the next 24 hours you can use MyChart to ask questions about today's visit, request a non-urgent call back, or ask for a work or school excuse. You will get an email in the next two days asking about your experience. I hope that your e-visit has been valuable and will speed your recovery.  I have spent 5 minutes in review of e-visit questionnaire, review and updating patient chart, medical decision making and response   to patient.   Willeen Cass, PhD, FNP-BC

## 2021-09-17 ENCOUNTER — Telehealth: Payer: Medicare Other | Admitting: Physician Assistant

## 2021-09-17 DIAGNOSIS — B9689 Other specified bacterial agents as the cause of diseases classified elsewhere: Secondary | ICD-10-CM

## 2021-09-17 MED ORDER — AZITHROMYCIN 250 MG PO TABS: ORAL_TABLET | ORAL | 0 refills | Status: AC

## 2021-09-17 MED ORDER — BENZONATATE 100 MG PO CAPS: 100.0000 mg | ORAL_CAPSULE | Freq: Three times a day (TID) | ORAL | 0 refills | Status: DC | PRN

## 2021-09-17 NOTE — Progress Notes (Signed)
We are sorry that you are not feeling well.  Here is how we plan to help!  Based on your presentation I believe you most likely have A cough due to bacteria.  When patients have a fever and a productive cough with a change in color or increased sputum production, we are concerned about bacterial bronchitis.  If left untreated it can progress to pneumonia.  If your symptoms do not improve with your treatment plan it is important that you contact your provider.   I have prescribed Azithromyin 250 mg: two tablets now and then one tablet daily for 4 additonal days    In addition you may use A non-prescription cough medication called Mucinex DM: take 2 tablets every 12 hours. and A prescription cough medication called Tessalon Perles 100mg. You may take 1-2 capsules every 8 hours as needed for your cough.   From your responses in the eVisit questionnaire you describe inflammation in the upper respiratory tract which is causing a significant cough.  This is commonly called Bronchitis and has four common causes:   Allergies Viral Infections Acid Reflux Bacterial Infection Allergies, viruses and acid reflux are treated by controlling symptoms or eliminating the cause. An example might be a cough caused by taking certain blood pressure medications. You stop the cough by changing the medication. Another example might be a cough caused by acid reflux. Controlling the reflux helps control the cough.  USE OF BRONCHODILATOR ("RESCUE") INHALERS: There is a risk from using your bronchodilator too frequently.  The risk is that over-reliance on a medication which only relaxes the muscles surrounding the breathing tubes can reduce the effectiveness of medications prescribed to reduce swelling and congestion of the tubes themselves.  Although you feel brief relief from the bronchodilator inhaler, your asthma may actually be worsening with the tubes becoming more swollen and filled with mucus.  This can delay other  crucial treatments, such as oral steroid medications. If you need to use a bronchodilator inhaler daily, several times per day, you should discuss this with your provider.  There are probably better treatments that could be used to keep your asthma under control.     HOME CARE Only take medications as instructed by your medical team. Complete the entire course of an antibiotic. Drink plenty of fluids and get plenty of rest. Avoid close contacts especially the very young and the elderly Cover your mouth if you cough or cough into your sleeve. Always remember to wash your hands A steam or ultrasonic humidifier can help congestion.   GET HELP RIGHT AWAY IF: You develop worsening fever. You become short of breath You cough up blood. Your symptoms persist after you have completed your treatment plan MAKE SURE YOU  Understand these instructions. Will watch your condition. Will get help right away if you are not doing well or get worse.    Thank you for choosing an e-visit.  Your e-visit answers were reviewed by a board certified advanced clinical practitioner to complete your personal care plan. Depending upon the condition, your plan could have included both over the counter or prescription medications.  Please review your pharmacy choice. Make sure the pharmacy is open so you can pick up prescription now. If there is a problem, you may contact your provider through MyChart messaging and have the prescription routed to another pharmacy.  Your safety is important to us. If you have drug allergies check your prescription carefully.   For the next 24 hours you can use   MyChart to ask questions about today's visit, request a non-urgent call back, or ask for a work or school excuse. You will get an email in the next two days asking about your experience. I hope that your e-visit has been valuable and will speed your recovery.  I provided 5 minutes of non face-to-face time during this encounter  for chart review and documentation.   

## 2021-09-18 ENCOUNTER — Encounter: Payer: Self-pay | Admitting: Cardiology

## 2021-09-23 NOTE — Telephone Encounter (Signed)
Called and spoke with patient. Made her aware that she can come to Anticoagulation Clinic in [place]; however [facility] is not an option this week because Lattie Haw is out of the office until next week. Pt stated she completed her antibiotics on Tuesday, 09/21/21 and wishes to keep her appts in [place]. Scheduled pt an appt with Lattie Haw at the anticoagulation clinic in [place] on Monday 09/27/21. Pt verbalized understanding.

## 2021-09-27 ENCOUNTER — Ambulatory Visit (INDEPENDENT_AMBULATORY_CARE_PROVIDER_SITE_OTHER): Payer: Medicare Other | Admitting: *Deleted

## 2021-09-27 DIAGNOSIS — Z5181 Encounter for therapeutic drug level monitoring: Secondary | ICD-10-CM | POA: Diagnosis not present

## 2021-09-27 DIAGNOSIS — I82621 Acute embolism and thrombosis of deep veins of right upper extremity: Secondary | ICD-10-CM

## 2021-09-27 LAB — POCT INR: INR: 2.2 (ref 2.0–3.0)

## 2021-10-20 ENCOUNTER — Encounter: Payer: Self-pay | Admitting: Family Medicine

## 2021-10-20 NOTE — Telephone Encounter (Signed)
Per referral coordinator: The Office tried to contact Patient but Patient never contacted them back for appt scheduling - If she would like to schedule an Appt she can return their call at [phone]

## 2021-10-21 NOTE — Telephone Encounter (Signed)
Patient has been informed per notes regarding referral to vein and vascular.

## 2021-10-27 ENCOUNTER — Other Ambulatory Visit: Payer: Self-pay | Admitting: *Deleted

## 2021-10-27 DIAGNOSIS — I872 Venous insufficiency (chronic) (peripheral): Secondary | ICD-10-CM

## 2021-11-08 ENCOUNTER — Encounter: Payer: Self-pay | Admitting: Family Medicine

## 2021-11-08 ENCOUNTER — Ambulatory Visit (INDEPENDENT_AMBULATORY_CARE_PROVIDER_SITE_OTHER): Payer: Medicare Other | Admitting: *Deleted

## 2021-11-08 DIAGNOSIS — F410 Panic disorder [episodic paroxysmal anxiety] without agoraphobia: Secondary | ICD-10-CM

## 2021-11-08 DIAGNOSIS — I82621 Acute embolism and thrombosis of deep veins of right upper extremity: Secondary | ICD-10-CM

## 2021-11-08 DIAGNOSIS — Z5181 Encounter for therapeutic drug level monitoring: Secondary | ICD-10-CM

## 2021-11-08 DIAGNOSIS — R42 Dizziness and giddiness: Secondary | ICD-10-CM

## 2021-11-08 LAB — POCT INR: INR: 2.6 (ref 2.0–3.0)

## 2021-11-08 NOTE — Patient Instructions (Signed)
Continue warfarin 1/2 tablet daily except 1 tablet on Mondays and Fridays.   Recheck INR 6 weeks  

## 2021-11-09 NOTE — Telephone Encounter (Signed)
Nurses Peggy should do a metabolic 7 before being seen.  Lab work due to dizziness.  She had other lab work in May and does not need additional lab work at this time please have her go ahead and schedule an office visit to be seen hopefully mid-to-late August at the latest.  Thanks-Dr. Nicki Reaper

## 2021-11-17 DIAGNOSIS — R42 Dizziness and giddiness: Secondary | ICD-10-CM | POA: Diagnosis not present

## 2021-11-18 LAB — BASIC METABOLIC PANEL
BUN/Creatinine Ratio: 14 (ref 12–28)
BUN: 16 mg/dL (ref 8–27)
CO2: 20 mmol/L (ref 20–29)
Calcium: 9.4 mg/dL (ref 8.7–10.3)
Chloride: 100 mmol/L (ref 96–106)
Creatinine, Ser: 1.15 mg/dL — ABNORMAL HIGH (ref 0.57–1.00)
Glucose: 110 mg/dL — ABNORMAL HIGH (ref 70–99)
Potassium: 4.4 mmol/L (ref 3.5–5.2)
Sodium: 137 mmol/L (ref 134–144)
eGFR: 48 mL/min/{1.73_m2} — ABNORMAL LOW (ref >59)

## 2021-11-24 ENCOUNTER — Encounter: Payer: Self-pay | Admitting: Family Medicine

## 2021-11-24 ENCOUNTER — Ambulatory Visit (INDEPENDENT_AMBULATORY_CARE_PROVIDER_SITE_OTHER): Payer: Medicare Other | Admitting: Family Medicine

## 2021-11-24 VITALS — BP 110/60 | HR 72 | Temp 98.4°F | Ht 60.0 in | Wt 161.0 lb

## 2021-11-24 DIAGNOSIS — R2689 Other abnormalities of gait and mobility: Secondary | ICD-10-CM

## 2021-11-24 DIAGNOSIS — G250 Essential tremor: Secondary | ICD-10-CM | POA: Diagnosis not present

## 2021-11-24 MED ORDER — PANTOPRAZOLE SODIUM 40 MG PO TBEC: 40.0000 mg | DELAYED_RELEASE_TABLET | Freq: Two times a day (BID) | ORAL | 1 refills | Status: DC

## 2021-11-24 MED ORDER — PAROXETINE HCL 20 MG PO TABS
ORAL_TABLET | ORAL | 1 refills | Status: DC
Start: 2021-11-24 — End: 2022-05-16

## 2021-11-24 MED ORDER — METFORMIN HCL ER 500 MG PO TB24: 500.0000 mg | ORAL_TABLET | Freq: Every day | ORAL | 1 refills | Status: DC

## 2021-11-24 NOTE — Patient Instructions (Signed)
Hi Jacqueline Morgan  It was good to see you today  There are some medications that could help you with your tremor Choice #1-propranolol-this is similar to metoprolol but is more effective for tremors Choice #2-primidone-this is a medication that we could try low-dose in the evening time to help with your tremor sometimes we have to prescribe it more than once per day  As for your balance it is important for you to do some gentle exercises of balance-please see the handout that I gave you today  Also I would recommend that you utilize a cane to help you with your balance especially when you are outside of the house  I would like to see you back in late November or December  Sooner if any problems TakeCare-Dr. Nicki Reaper

## 2021-11-24 NOTE — Progress Notes (Signed)
   Subjective:    Patient ID: Jacqueline Morgan, female    DOB: Oct 17, 1940, 81 y.o.   MRN: [id number]  Dizziness This is a chronic problem. The current episode started more than 1 month ago. The problem occurs constantly.  Neck aches  Even to the touch Feels unsteady sometimes feels off sometimes room spins No severe heaaches Not slleping well Patient relates that her tremor is getting bad and is very difficult for her to write it is difficult for her to do intricate motions with her hands at times difficulty getting dressed. She relates her self feeling very anxious nervous She also relates insomnia issues but we did discuss behavioral approaches to try to help that to minimize additional medicines  Review of Systems  Neurological:  Positive for dizziness.       Objective:   Physical Exam  General-in no acute distress Eyes-no discharge Lungs-respiratory rate normal, CTA CV-no murmurs,RRR Extremities skin warm dry no edema Neuro grossly normal Behavior normal, alert  Significant tremor consistent with benign essential tremor Patient also has poor balance with wavering in her legs as well as difficulty standing Romberg No unilateral numbness or weakness I do not feel this is a stroke I feel this is a combination of deconditioning along with benign essential tremor    Assessment & Plan:  1. Poor balance I did print her some exercises she can do safely in her home Use the cane when she does walk Increase amount of walking to build up stamina and strength   2. Benign essential tremor This is gotten worse over the years recommend propanolol patient would like to think about it I gave her a couple options she could consider propranolol and primidone she is currently on metoprolol but that does not do as much for her benign essential tremor  Underlying anxiety and worry continue current medications  Poor sleep patterns behavioral approaches recommended lorazepam at night if  necessary  Follow-up within 3 months

## 2021-11-25 DIAGNOSIS — Z1231 Encounter for screening mammogram for malignant neoplasm of breast: Secondary | ICD-10-CM | POA: Diagnosis not present

## 2021-11-25 LAB — HM MAMMOGRAPHY: HM Mammogram: NORMAL (ref 0–4)

## 2021-11-29 ENCOUNTER — Ambulatory Visit (INDEPENDENT_AMBULATORY_CARE_PROVIDER_SITE_OTHER): Payer: Medicare Other | Admitting: Physician Assistant

## 2021-11-29 ENCOUNTER — Ambulatory Visit (HOSPITAL_COMMUNITY)
Admission: RE | Admit: 2021-11-29 | Discharge: 2021-11-29 | Disposition: A | Discharge status: Home / Self Care | Payer: Medicare Other | Source: Ambulatory Visit | Attending: Physician Assistant | Admitting: Physician Assistant

## 2021-11-29 VITALS — BP 148/83 | HR 70 | Temp 98.0°F | Resp 14 | Ht 60.0 in | Wt 160.0 lb

## 2021-11-29 DIAGNOSIS — I872 Venous insufficiency (chronic) (peripheral): Secondary | ICD-10-CM | POA: Insufficient documentation

## 2021-11-29 DIAGNOSIS — I75029 Atheroembolism of unspecified lower extremity: Secondary | ICD-10-CM | POA: Diagnosis not present

## 2021-11-29 NOTE — Progress Notes (Unsigned)
[facility] [place]   Reason for referral: discolored toes in dependent position   History of Present Illness  Jacqueline Morgan is a 81 y.o. female who presents with chief complaint: Blue toes without symptoms of claudication, rest pain or edema.  She noticed bluish discoloration of her toes with prolonged sitting.  She has developed episodes of losing her balance being worked up by her primary care with episodes of dizziness.    Past medical history left brachial embolectomy by Dr. Bridgett Larsson on 12/19/2012, Hyper lipidemia, HTN, and essential tremors.     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    Past Medical History:  Diagnosis Date   Anxiety    Complication of anesthesia    passed out after dental surgery   Depression    Fibromyalgia    GERD (gastroesophageal reflux disease)    Hyperlipidemia    Hypertension    Palpitations    Tremor, essential 11/30/2015    Past Surgical History:  Procedure Laterality Date   COLONOSCOPY N/A 04/01/2015   Procedure: COLONOSCOPY;  Surgeon: Rogene Houston, MD;  [facility];  Service: Endoscopy;  Laterality: N/A;  1:45   COLONOSCOPY WITH PROPOFOL N/A 10/18/2019   Procedure: COLONOSCOPY WITH PROPOFOL;  Surgeon: Rogene Houston, MD;  [facility];  Service: Endoscopy;  Laterality: N/A;  730   EMBOLECTOMY Left 12/19/2012   Procedure: EMBOLECTOMY OF LEFT BRACHIAL ARTERY;  Surgeon: Conrad Perkins, MD;  Location: [place];  Service: Vascular;  Laterality: Left;   EYE SURGERY     POLYPECTOMY  10/18/2019   Procedure: POLYPECTOMY;  Surgeon: Rogene Houston, MD;  [facility];  Service: Endoscopy;;    Social History   Socioeconomic History   Marital status: Widowed    Spouse name: Not on file   Number of children: 2   Years of education: 9   Highest education level: Not on file  Occupational History   Occupation: Retired  Tobacco  Use   Smoking status: Never   Smokeless tobacco: Never  Vaping Use   Vaping Use: Never used  Substance and Sexual Activity   Alcohol use: No   Drug use: No   Sexual activity: Not on file  Other Topics Concern   Not on file  Social History Narrative   Lives alone   Right-handed   Caffeine: 2 twelve oz Dr. Lyndee Hensen per day   Social Determinants of Health   Financial Resource Strain: Not on file  Food Insecurity: Not on file  Transportation Needs: Not on file  Physical Activity: Not on file  Stress: Not on file  Social Connections: Not on file  Intimate Partner Violence: Not on file    Family History  Problem Relation Age of Onset   Cancer Mother        ovarian, colon   Heart disease Mother    Heart attack Father    Glaucoma Father    COPD Sister    Benign prostatic hyperplasia Brother     Current Outpatient Medications on File Prior to Visit  Medication Sig Dispense Refill   acetaminophen (TYLENOL) 500 MG tablet Take 1,000 mg by mouth every 8 (eight) hours as needed for moderate pain. For headache/pain      blood glucose meter kit and supplies Dispense based on patient and insurance preference. Use to check sugars once per day. (FOR ICD-10 E10.9, E11.9).  1 each 0   glucose blood (ONETOUCH VERIO) test strip USE TO CHECK SUGAR ONCE DAILY DX CODE R73.03, E11.69, E78.5 50 strip 5   Lancets (ONETOUCH DELICA PLUS [id number]) MISC USE TO CHECK SUGAR ONCE DAILY DX CODE R73.03, E11.69, E78.5 100 each 5   LORazepam (ATIVAN) 1 MG tablet 1/2  during the day as needed for anxiety and 1 qhs prn insomnia 45 tablet 5   metFORMIN (GLUCOPHAGE-XR) 500 MG 24 hr tablet Take 1 tablet (500 mg total) by mouth daily with breakfast. 90 tablet 1   metoprolol succinate (TOPROL-XL) 25 MG 24 hr tablet 1 qd 90 tablet 3   pantoprazole (PROTONIX) 40 MG tablet Take 1 tablet (40 mg total) by mouth 2 (two) times daily. 180 tablet 1   PARoxetine (PAXIL) 20 MG tablet TAKE 1 TABLET BY MOUTH IN  THE MORNING AND  2 TABLETS  BY MOUTH IN THE EVENING 270 tablet 1   rosuvastatin (CRESTOR) 10 MG tablet Take 1 tablet (10 mg total) by mouth daily. 90 tablet 3   warfarin (COUMADIN) 5 MG tablet TAKE 1/2 -1 TABLET BY MOUTH DAILY AS DIRECTED. 30 tablet 6   No current facility-administered medications on file prior to visit.    Allergies as of 11/29/2021 - Review Complete 11/29/2021  Allergen Reaction Noted   Codeine     Statins  06/24/2014     ROS:   General:  No weight loss, Fever, chills  HEENT: No recent headaches, no nasal bleeding, no visual changes, no sore throat  Neurologic: No dizziness, blackouts, seizures. No recent symptoms of stroke or mini- stroke. No recent episodes of slurred speech, or temporary blindness.  Cardiac: No recent episodes of chest pain/pressure, no shortness of breath at rest.  No shortness of breath with exertion.  Denies history of atrial fibrillation or irregular heartbeat  Vascular: No history of rest pain in feet.  No history of claudication.  No history of non-healing ulcer, positive history of DVT   Pulmonary: No home oxygen, no productive cough, no hemoptysis,  No asthma or wheezing  Musculoskeletal:  [ ] Arthritis, [ ] Low back pain,  [ ] Joint pain  Hematologic:No history of hypercoagulable state.  No history of easy bleeding.  No history of anemia  Gastrointestinal: No hematochezia or melena,  No gastroesophageal reflux, no trouble swallowing  Urinary: [ ] chronic Kidney disease, [ ] on HD - [ ] MWF or [ ] TTHS, [ ] Burning with urination, [ ] Frequent urination, [ ] Difficulty urinating;   Skin: No rashes  Psychological: No history of anxiety,  No history of depression  Physical Examination  Vitals:   11/29/21 1350  BP: (!) 148/83  Pulse: 70  Resp: 14  Temp: 98 F (36.7 C)  TempSrc: Temporal  SpO2: 100%  Weight: 160 lb (72.6 kg)  Height: 5' (1.524 m)    Body mass index is 31.25 kg/m.  General:  Alert and oriented, no acute  distress HEENT: Normal Neck: No bruit or JVD Pulmonary: Clear to auscultation bilaterally Cardiac: Regular Rate and Rhythm without murmur Abdomen: Soft, non-tender, non-distended, no mass, no scars Skin: No rash Extremity Pulses:  2+ radial,  femoral, dorsalis pedis, pulses bilaterally Musculoskeletal: No deformity or edema  Neurologic: Upper and lower extremity motor 5/5 and symmetric  DATA:  Venous Reflux Times  +--------------+---------+------+-----------+------------+--------+  RIGHT        Reflux NoRefluxReflux TimeDiameter cmsComments  Yes                                   +--------------+---------+------+-----------+------------+--------+  CFV                    yes   >1 second                       +--------------+---------+------+-----------+------------+--------+  FV prox       no                                              +--------------+---------+------+-----------+------------+--------+  FV mid        no                                              +--------------+---------+------+-----------+------------+--------+  FV dist       no                                              +--------------+---------+------+-----------+------------+--------+  Popliteal    no                                              +--------------+---------+------+-----------+------------+--------+  GSV at [facility]    no                            0.29              +--------------+---------+------+-----------+------------+--------+  GSV prox thighno                            0.41              +--------------+---------+------+-----------+------------+--------+  GSV mid thigh no                            0.27              +--------------+---------+------+-----------+------------+--------+  GSV dist thighno                            0.13               +--------------+---------+------+-----------+------------+--------+  GSV at knee             yes    >500 ms      0.23              +--------------+---------+------+-----------+------------+--------+  GSV prox calf           yes    >500 ms      0.10              +--------------+---------+------+-----------+------------+--------+  SSV Pop Fossa no  0.15              +--------------+---------+------+-----------+------------+--------+  SSV prox calf no                            0.09              +--------------+---------+------+-----------+------------+--------+      Assessment/Plan: Dependent Blue toes Minimal venous reflux was noted on venous reflux duplex.  She has no edema on a daily basis. She has palpable pulse B LE.  She denies symptoms of claudication, rest pain or non healing wounds.   She has symptoms of balance issues with dizziness.  I reviewed her previous carotid study 11/13/2015 with < 49% stenosis B ICA's. I will order a f/u carotid duplex in the next month for follow up.    She denies symptoms of stroke.  No amaurosis, weakness or aphasia.     Ruta Hinds, MD [facility] [place] Office: [phone] Pager: [phone]

## 2021-11-30 ENCOUNTER — Encounter: Payer: Self-pay | Admitting: Physician Assistant

## 2021-12-03 ENCOUNTER — Encounter: Payer: Self-pay | Admitting: Family Medicine

## 2021-12-07 ENCOUNTER — Other Ambulatory Visit: Payer: Self-pay

## 2021-12-07 DIAGNOSIS — I6523 Occlusion and stenosis of bilateral carotid arteries: Secondary | ICD-10-CM

## 2021-12-10 ENCOUNTER — Ambulatory Visit (INDEPENDENT_AMBULATORY_CARE_PROVIDER_SITE_OTHER): Payer: Medicare Other | Admitting: Nurse Practitioner

## 2021-12-10 ENCOUNTER — Encounter: Payer: Self-pay | Admitting: Nurse Practitioner

## 2021-12-10 VITALS — BP 136/82 | HR 74 | Temp 97.3°F | Ht 60.0 in | Wt 161.0 lb

## 2021-12-10 DIAGNOSIS — H6121 Impacted cerumen, right ear: Secondary | ICD-10-CM | POA: Diagnosis not present

## 2021-12-10 DIAGNOSIS — H6501 Acute serous otitis media, right ear: Secondary | ICD-10-CM | POA: Diagnosis not present

## 2021-12-10 DIAGNOSIS — I6523 Occlusion and stenosis of bilateral carotid arteries: Secondary | ICD-10-CM | POA: Diagnosis not present

## 2021-12-10 NOTE — Progress Notes (Unsigned)
Subjective:    Patient ID: Jacqueline Morgan, female    DOB: [birth year], 81 y.o.   MRN: [id number]  HPI  Right ear feeling stopped up; occasionally hears "water running"; more rarely ringing in the ears. Seeing vascular specialist. Has carotid US scheduled.  Felt lymph nodes behind the ear as well as on both sides of the neck were swollen for about a week, much better over the past 2 days. No fever. No scalp rash. No tick bites.   She describes right ear pain as dull and achy but not acute.  Also c/o headache in occipital region on Sunday which improved. C/o poor balance and dizziness issues ongoing and unrelated to ears, per patient. Denies recent falls. Reports some increase in drainage and sore throat in the mornings increased due to allergies which she says causes some nausea. Recent blurred vision is a concern, but she denies new tingling or numbness other than related to fibromyalgia and sees her eye doctor every 6 months. Taking mediations as directed. No s/s of hypoglycemia. Daily blood sugar ranges around 80. Was 59 this morning.   Review of Systems  Constitutional:  Negative for fever.  HENT:  Positive for congestion, ear pain, mouth sores, sore throat and tinnitus. Negative for ear discharge, postnasal drip, rhinorrhea, sinus pain and trouble swallowing.        Feeling of fullness or blockage in the right ear.   Eyes:  Positive for discharge.  Respiratory:  Negative for cough, chest tightness, shortness of breath and wheezing.   Cardiovascular:  Negative for chest pain and palpitations.      Objective:     Physical Exam Vitals and nursing note reviewed.  Constitutional:      General: She is not in acute distress. HENT:     Head:     Comments: Scalp clear.     Right Ear: There is impacted cerumen.     Ears:     Comments: Light yellow cerumen impaction right ear removed by irrigation and curette without difficulty. Rt TM: mild clear effusion, no erythema. Lt TM: retracted,  no erythema.     Mouth/Throat:     Mouth: Mucous membranes are moist.     Pharynx: Oropharynx is clear.  Neck:     Comments: Minimal anterior cervical adenopathy. One small non tender resolving post auricular lymph node noted right side. No occipital lymph nodes palpated.  Cardiovascular:     Rate and Rhythm: Normal rate and regular rhythm.     Heart sounds: No murmur heard. Pulmonary:     Effort: Pulmonary effort is normal.     Breath sounds: Normal breath sounds.  Musculoskeletal:     Cervical back: Normal range of motion and neck supple.  Neurological:     Mental Status: She is alert.     Gait: Gait normal.    Today's Vitals   12/10/21 1051  BP: 136/82  Pulse: 74  Temp: (!) 97.3 F (36.3 C)  SpO2: 96%  Weight: 161 lb (73 kg)  Height: 5' (1.524 m)   Body mass index is 31.44 kg/m.     Assessment & Plan:  Non-recurrent acute serous otitis media of right ear  Impacted cerumen of right ear  Patient advised to flush ears once daily with warm water and peroxide.  Recommend steroid nasal spray as directed.   Referral: Consider EENT referral if no improvement.   Follow up: Follow up with vascular specialist as planned for carotid US.

## 2021-12-11 ENCOUNTER — Encounter: Payer: Self-pay | Admitting: Nurse Practitioner

## 2021-12-16 ENCOUNTER — Ambulatory Visit (INDEPENDENT_AMBULATORY_CARE_PROVIDER_SITE_OTHER): Payer: Medicare Other | Admitting: Physician Assistant

## 2021-12-16 ENCOUNTER — Telehealth: Payer: Self-pay | Admitting: Family Medicine

## 2021-12-16 ENCOUNTER — Ambulatory Visit (HOSPITAL_COMMUNITY)
Admission: RE | Admit: 2021-12-16 | Discharge: 2021-12-16 | Disposition: A | Discharge status: Home / Self Care | Payer: Medicare Other | Source: Ambulatory Visit | Attending: Vascular Surgery | Admitting: Vascular Surgery

## 2021-12-16 VITALS — BP 170/76 | HR 75 | Temp 98.2°F | Resp 20 | Ht 60.0 in | Wt 162.0 lb

## 2021-12-16 DIAGNOSIS — I6523 Occlusion and stenosis of bilateral carotid arteries: Secondary | ICD-10-CM | POA: Insufficient documentation

## 2021-12-16 NOTE — Progress Notes (Signed)
History of Present Illness:  Patient is a 81 y.o. year old female who presents for evaluation of carotid stenosis.  The patient denies symptoms of TIA, amaurosis, or stroke.  She was seen in our office 11/29/21 with complaints of dizziness and balance issues.   I reviewed her previous carotid study 11/13/2015 with < 49% stenosis B ICA's.   She returns today for carotid duplex.    She denies any changes since she was last seen.  She is medically managed on Crestor and Coumadin.         Past Medical History:  Diagnosis Date   Anxiety    Complication of anesthesia    passed out after dental surgery   Depression    Fibromyalgia    GERD (gastroesophageal reflux disease)    Hyperlipidemia    Hypertension    Palpitations    Tremor, essential 11/30/2015    Past Surgical History:  Procedure Laterality Date   COLONOSCOPY N/A 04/01/2015   Procedure: COLONOSCOPY;  Surgeon: Rogene Houston, MD;  [facility];  Service: Endoscopy;  Laterality: N/A;  1:45   COLONOSCOPY WITH PROPOFOL N/A 10/18/2019   Procedure: COLONOSCOPY WITH PROPOFOL;  Surgeon: Rogene Houston, MD;  [facility];  Service: Endoscopy;  Laterality: N/A;  730   EMBOLECTOMY Left 12/19/2012   Procedure: EMBOLECTOMY OF LEFT BRACHIAL ARTERY;  Surgeon: Conrad Monticello, MD;  Location: [place];  Service: Vascular;  Laterality: Left;   EYE SURGERY     POLYPECTOMY  10/18/2019   Procedure: POLYPECTOMY;  Surgeon: Rogene Houston, MD;  [facility];  Service: Endoscopy;;     Social History Social History   Tobacco Use   Smoking status: Never    Passive exposure: Never   Smokeless tobacco: Never  Vaping Use   Vaping Use: Never used  Substance Use Topics   Alcohol use: No   Drug use: No    Family History Family History  Problem Relation Age of Onset   Cancer Mother        ovarian, colon   Heart disease Mother    Heart attack Father    Glaucoma Father    COPD Sister    Benign prostatic  hyperplasia Brother     Allergies  Allergies  Allergen Reactions   Codeine     REACTION: Adverse gastrointestinal effects   Statins     Felt bad     Current Outpatient Medications  Medication Sig Dispense Refill   acetaminophen (TYLENOL) 500 MG tablet Take 1,000 mg by mouth every 8 (eight) hours as needed for moderate pain. For headache/pain      blood glucose meter kit and supplies Dispense based on patient and insurance preference. Use to check sugars once per day. (FOR ICD-10 E10.9, E11.9). 1 each 0   glucose blood (ONETOUCH VERIO) test strip USE TO CHECK SUGAR ONCE DAILY DX CODE R73.03, E11.69, E78.5 50 strip 5   Lancets (ONETOUCH DELICA PLUS [id number]) MISC USE TO CHECK SUGAR ONCE DAILY DX CODE R73.03, E11.69, E78.5 100 each 5   LORazepam (ATIVAN) 1 MG tablet 1/2  during the day as needed for anxiety and 1 qhs prn insomnia 45 tablet 5   metFORMIN (GLUCOPHAGE-XR) 500 MG 24 hr tablet Take 1 tablet (500 mg total) by mouth daily with breakfast. 90 tablet 1   metoprolol succinate (TOPROL-XL) 25 MG 24 hr tablet 1 qd 90 tablet 3   pantoprazole (PROTONIX) 40 MG tablet Take  1 tablet (40 mg total) by mouth 2 (two) times daily. 180 tablet 1   PARoxetine (PAXIL) 20 MG tablet TAKE 1 TABLET BY MOUTH IN  THE MORNING AND 2 TABLETS  BY MOUTH IN THE EVENING 270 tablet 1   rosuvastatin (CRESTOR) 10 MG tablet Take 1 tablet (10 mg total) by mouth daily. 90 tablet 3   warfarin (COUMADIN) 5 MG tablet TAKE 1/2 -1 TABLET BY MOUTH DAILY AS DIRECTED. 30 tablet 6   No current facility-administered medications for this visit.    ROS:   General:  No weight loss, Fever, chills  HEENT: No recent headaches, no nasal bleeding, no visual changes, no sore throat  Neurologic: No dizziness, blackouts, seizures. No recent symptoms of stroke or mini- stroke. No recent episodes of slurred speech, or temporary blindness.  Cardiac: No recent episodes of chest pain/pressure, no shortness of breath at rest.  No  shortness of breath with exertion.  Denies history of atrial fibrillation or irregular heartbeat  Vascular: No history of rest pain in feet.  No history of claudication.  No history of non-healing ulcer, No history of DVT   Pulmonary: No home oxygen, no productive cough, no hemoptysis,  No asthma or wheezing  Musculoskeletal:  _0  Arthritis, _1  Low back pain,  _2  Joint pain  Hematologic:No history of hypercoagulable state.  No history of easy bleeding.  No history of anemia  Gastrointestinal: No hematochezia or melena,  No gastroesophageal reflux, no trouble swallowing  Urinary: _3  chronic Kidney disease, _4  on HD - _5  MWF or _6  TTHS, _7  Burning with urination, _8  Frequent urination, _9  Difficulty urinating;   Skin: No rashes  Psychological: No history of anxiety,  No history of depression   Physical Examination  Vitals:   12/16/21 0802 12/16/21 0804  BP: (!) 155/88 (!) 170/76  Pulse: 75   Resp: 20   Temp: 98.2 F (36.8 C)   TempSrc: Temporal   Weight: 162 lb (73.5 kg)   Height: 5' (1.524 m)     Body mass index is 31.64 kg/m.  Body mass index is 31.25 kg/m.   General:  Alert and oriented, no acute distress HEENT: Normal Neck: No bruit or JVD Pulmonary: Clear to auscultation bilaterally Cardiac: Regular Rate and Rhythm without murmur Abdomen: Soft, non-tender, non-distended, no mass, no scars Skin: No rash Extremity Pulses:   radial,  femoral, dorsalis pedis, pulses bilaterally Musculoskeletal: No deformity or edema      Neurologic: Upper and lower extremity motor 5/5 and symmetric  DATA:   Right Carotid Findings:  +----------+--------+--------+--------+------------------+--------+            PSV cm/sEDV cm/sStenosisPlaque DescriptionComments  +----------+--------+--------+--------+------------------+--------+  CCA Prox  63      11                                          +----------+--------+--------+--------+------------------+--------+   CCA Mid   67      18                                          +----------+--------+--------+--------+------------------+--------+  CCA Distal71      19              hypoechoic                  +----------+--------+--------+--------+------------------+--------+  ICA Prox  71      22      1-39%   hypoechoic                  +----------+--------+--------+--------+------------------+--------+  ICA Mid   108     29      1-39%   hyperechoic                 +----------+--------+--------+--------+------------------+--------+  ICA Distal98      29                                          +----------+--------+--------+--------+------------------+--------+  ECA       53      7                                           +----------+--------+--------+--------+------------------+--------+   +----------+--------+-------+----------------+-------------------+            PSV cm/sEDV cmsDescribe        Arm Pressure (mmHG)  +----------+--------+-------+----------------+-------------------+  [id number]            Multiphasic, WNL                     +----------+--------+-------+----------------+-------------------+   +---------+--------+--+--------+--+---------+  VertebralPSV cm/s58EDV cm/s17Antegrade  +---------+--------+--+--------+--+---------+       Left Carotid Findings:  +----------+--------+--------+--------+------------------+--------+            PSV cm/sEDV cm/sStenosisPlaque DescriptionComments  +----------+--------+--------+--------+------------------+--------+  CCA Prox  86      18                                          +----------+--------+--------+--------+------------------+--------+  CCA Mid   83      22                                          +----------+--------+--------+--------+------------------+--------+  CCA Distal74      21              hypoechoic                   +----------+--------+--------+--------+------------------+--------+  ICA Prox  68      16      1-39%   hypoechoic        tortuous  +----------+--------+--------+--------+------------------+--------+  ICA Mid   87      26                                          +----------+--------+--------+--------+------------------+--------+  ICA Distal101     27                                          +----------+--------+--------+--------+------------------+--------+  ECA       60      10                                          +----------+--------+--------+--------+------------------+--------+   +----------+--------+--------+----------------+-------------------+  PSV cm/sEDV cm/sDescribe        Arm Pressure (mmHG)  +----------+--------+--------+----------------+-------------------+  Subclavian117             Multiphasic, WNL                     +----------+--------+--------+----------------+-------------------+   +---------+--------+--+--------+-+----------------------------+  VertebralPSV cm/s50EDV cm/s9Antegrade and High resistant  +---------+--------+--+--------+-+----------------------------+           Summary:  Right Carotid: Velocities in the right ICA are consistent with a 1-39%  stenosis.   Left Carotid: Velocities in the left ICA are consistent with a 1-39%  stenosis.   Vertebrals:  Bilateral vertebral arteries demonstrate antegrade flow.  Subclavians: Normal flow hemodynamics were seen in bilateral subclavian               arteries.    ASSESSMENT/PLAN: Asymptomatic Carotid stenosis The duplex show B ICA < 39% stenosis.  Based on her carotid duplex she within normal ranges of low stenosis and not in need of vascular intervention.  If she develops symptoms of stroke she will call 911.  She is followed by PCP and possible referral to ENT for dizziness  She will f/u in 1 year for repeat surveillance to include ABI's.       Roxy Horseman PA-C [facility] [place] Office: [phone]

## 2021-12-16 NOTE — Telephone Encounter (Signed)
Nurses-last time patient was here she had cerumen we recommended consultation with ENT patient deferred.  Please find out from patient if she is interested in setting up an ENT appointment if she is not please document accordingly If she does want referral go ahead Thank you

## 2021-12-17 ENCOUNTER — Other Ambulatory Visit: Payer: Self-pay

## 2021-12-17 DIAGNOSIS — I75029 Atheroembolism of unspecified lower extremity: Secondary | ICD-10-CM

## 2021-12-17 DIAGNOSIS — I6523 Occlusion and stenosis of bilateral carotid arteries: Secondary | ICD-10-CM

## 2021-12-17 NOTE — Telephone Encounter (Signed)
Patient states has been going to doctors appts this week and will give it another week to see how she is feeling, she will message or call to let us know when she is ready for the referral to ENT specialist if she decides.

## 2021-12-20 ENCOUNTER — Encounter: Payer: Self-pay | Admitting: Nurse Practitioner

## 2021-12-20 ENCOUNTER — Ambulatory Visit: Payer: Medicare Other | Attending: Cardiology | Admitting: *Deleted

## 2021-12-20 DIAGNOSIS — I82621 Acute embolism and thrombosis of deep veins of right upper extremity: Secondary | ICD-10-CM | POA: Diagnosis not present

## 2021-12-20 DIAGNOSIS — Z5181 Encounter for therapeutic drug level monitoring: Secondary | ICD-10-CM | POA: Diagnosis not present

## 2021-12-20 LAB — POCT INR: INR: 2.6 (ref 2.0–3.0)

## 2021-12-20 NOTE — Patient Instructions (Signed)
Continue warfarin 1/2 tablet daily except 1 tablet on Mondays and Fridays.   Recheck INR 6 weeks  

## 2021-12-20 NOTE — Telephone Encounter (Signed)
Nurses-for fluid on the ear and recommend OTC loratadine but also recommend Flonase 2 sprays each nare daily May send in 1 vial with 2 refills if her ear is not doing better within the next 2 weeks we will need to see her thank you

## 2021-12-20 NOTE — Telephone Encounter (Signed)
Nurses In this situation the dizziness could be a complex result of multiple different issues possibly blood pressure, medications can sometimes cause dizziness, sometimes systemic changes within her body can trigger dizziness In some cases referral to neurology is also necessary I would recommend a follow-up visit in the near future with myself if possible (This week I am taking significant time off should be able to become available for visit next week within a open slot thank you)

## 2021-12-21 MED ORDER — FLUTICASONE PROPIONATE 50 MCG/ACT NA SUSP: 2.0000 | Freq: Every day | NASAL | 2 refills | Status: AC

## 2021-12-23 DIAGNOSIS — E1151 Type 2 diabetes mellitus with diabetic peripheral angiopathy without gangrene: Secondary | ICD-10-CM | POA: Diagnosis not present

## 2021-12-23 DIAGNOSIS — L603 Nail dystrophy: Secondary | ICD-10-CM | POA: Diagnosis not present

## 2021-12-23 DIAGNOSIS — L84 Corns and callosities: Secondary | ICD-10-CM | POA: Diagnosis not present

## 2022-01-11 ENCOUNTER — Encounter: Payer: Self-pay | Admitting: Family Medicine

## 2022-01-19 ENCOUNTER — Encounter: Payer: Self-pay | Admitting: *Deleted

## 2022-01-27 ENCOUNTER — Other Ambulatory Visit: Payer: Self-pay | Admitting: Family Medicine

## 2022-01-31 ENCOUNTER — Ambulatory Visit: Payer: Medicare Other | Attending: Cardiology | Admitting: *Deleted

## 2022-01-31 DIAGNOSIS — I82621 Acute embolism and thrombosis of deep veins of right upper extremity: Secondary | ICD-10-CM

## 2022-01-31 DIAGNOSIS — Z5181 Encounter for therapeutic drug level monitoring: Secondary | ICD-10-CM | POA: Diagnosis not present

## 2022-01-31 LAB — POCT INR: INR: 2.3 (ref 2.0–3.0)

## 2022-01-31 NOTE — Patient Instructions (Signed)
Continue warfarin 1/2 tablet daily except 1 tablet on Mondays and Fridays.   Recheck INR 6 weeks

## 2022-02-02 ENCOUNTER — Encounter: Payer: Self-pay | Admitting: Nurse Practitioner

## 2022-02-16 DIAGNOSIS — H401131 Primary open-angle glaucoma, bilateral, mild stage: Secondary | ICD-10-CM | POA: Diagnosis not present

## 2022-02-16 DIAGNOSIS — D3131 Benign neoplasm of right choroid: Secondary | ICD-10-CM | POA: Diagnosis not present

## 2022-02-16 DIAGNOSIS — H02831 Dermatochalasis of right upper eyelid: Secondary | ICD-10-CM | POA: Diagnosis not present

## 2022-02-16 DIAGNOSIS — Z961 Presence of intraocular lens: Secondary | ICD-10-CM | POA: Diagnosis not present

## 2022-02-16 DIAGNOSIS — H02834 Dermatochalasis of left upper eyelid: Secondary | ICD-10-CM | POA: Diagnosis not present

## 2022-02-16 LAB — HM DIABETES EYE EXAM

## 2022-02-18 ENCOUNTER — Encounter: Payer: Self-pay | Admitting: Family Medicine

## 2022-02-18 DIAGNOSIS — E119 Type 2 diabetes mellitus without complications: Secondary | ICD-10-CM

## 2022-02-18 DIAGNOSIS — Z79899 Other long term (current) drug therapy: Secondary | ICD-10-CM

## 2022-02-18 DIAGNOSIS — E1169 Type 2 diabetes mellitus with other specified complication: Secondary | ICD-10-CM

## 2022-02-18 DIAGNOSIS — I10 Essential (primary) hypertension: Secondary | ICD-10-CM

## 2022-02-21 NOTE — Telephone Encounter (Signed)
Nurses-please order the following lipid, liver, metabolic 7, D6Q, urine ACR  Diagnosis hyperlipidemia diabetes  Past along the following message  Hi Jacqueline Morgan  We look forward to seeing you.  Please do your lab work.  Try to do this in the middle of the day when you are well-hydrated with fluids.  In addition to blood work we are also doing a urine test to check how well the kidneys are filtering protein.  We will discuss the findings of your lab work and urine test when you come in  [place] [facility]-Dr. Nicki Reaper

## 2022-02-22 DIAGNOSIS — Z79899 Other long term (current) drug therapy: Secondary | ICD-10-CM | POA: Diagnosis not present

## 2022-02-22 DIAGNOSIS — E785 Hyperlipidemia, unspecified: Secondary | ICD-10-CM | POA: Diagnosis not present

## 2022-02-22 DIAGNOSIS — E1169 Type 2 diabetes mellitus with other specified complication: Secondary | ICD-10-CM | POA: Diagnosis not present

## 2022-02-22 DIAGNOSIS — E119 Type 2 diabetes mellitus without complications: Secondary | ICD-10-CM | POA: Diagnosis not present

## 2022-02-22 DIAGNOSIS — I10 Essential (primary) hypertension: Secondary | ICD-10-CM | POA: Diagnosis not present

## 2022-02-23 LAB — BASIC METABOLIC PANEL
BUN/Creatinine Ratio: 14 (ref 12–28)
BUN: 17 mg/dL (ref 8–27)
CO2: 19 mmol/L — ABNORMAL LOW (ref 20–29)
Calcium: 9.4 mg/dL (ref 8.7–10.3)
Chloride: 100 mmol/L (ref 96–106)
Creatinine, Ser: 1.25 mg/dL — ABNORMAL HIGH (ref 0.57–1.00)
Glucose: 112 mg/dL — ABNORMAL HIGH (ref 70–99)
Potassium: 5 mmol/L (ref 3.5–5.2)
Sodium: 139 mmol/L (ref 134–144)
eGFR: 43 mL/min/{1.73_m2} — ABNORMAL LOW (ref >59)

## 2022-02-23 LAB — LIPID PANEL
Chol/HDL Ratio: 3.1 ratio (ref 0.0–4.4)
Cholesterol, Total: 135 mg/dL (ref 100–199)
HDL: 44 mg/dL (ref >39)
LDL Chol Calc (NIH): 63 mg/dL (ref 0–99)
Triglycerides: 166 mg/dL — ABNORMAL HIGH (ref 0–149)
VLDL Cholesterol Cal: 28 mg/dL (ref 5–40)

## 2022-02-23 LAB — MICROALBUMIN / CREATININE URINE RATIO
Creatinine, Urine: 184.7 mg/dL
Microalb/Creat Ratio: 6 mg/g creat (ref 0–29)
Microalbumin, Urine: 10.3 ug/mL

## 2022-02-23 LAB — HEPATIC FUNCTION PANEL
ALT: 14 IU/L (ref 0–32)
AST: 22 IU/L (ref 0–40)
Albumin: 4.1 g/dL (ref 3.7–4.7)
Alkaline Phosphatase: 92 IU/L (ref 44–121)
Bilirubin Total: 0.3 mg/dL (ref 0.0–1.2)
Bilirubin, Direct: 0.13 mg/dL (ref 0.00–0.40)
Total Protein: 7.3 g/dL (ref 6.0–8.5)

## 2022-02-23 LAB — HEMOGLOBIN A1C

## 2022-03-01 ENCOUNTER — Ambulatory Visit (INDEPENDENT_AMBULATORY_CARE_PROVIDER_SITE_OTHER): Payer: Medicare Other | Admitting: Family Medicine

## 2022-03-01 VITALS — BP 130/72 | HR 68 | Temp 97.0°F | Ht 60.0 in | Wt 159.0 lb

## 2022-03-01 DIAGNOSIS — F411 Generalized anxiety disorder: Secondary | ICD-10-CM

## 2022-03-01 DIAGNOSIS — E119 Type 2 diabetes mellitus without complications: Secondary | ICD-10-CM | POA: Diagnosis not present

## 2022-03-01 DIAGNOSIS — G250 Essential tremor: Secondary | ICD-10-CM

## 2022-03-01 DIAGNOSIS — M25561 Pain in right knee: Secondary | ICD-10-CM | POA: Diagnosis not present

## 2022-03-01 DIAGNOSIS — E1122 Type 2 diabetes mellitus with diabetic chronic kidney disease: Secondary | ICD-10-CM | POA: Diagnosis not present

## 2022-03-01 DIAGNOSIS — I6523 Occlusion and stenosis of bilateral carotid arteries: Secondary | ICD-10-CM | POA: Diagnosis not present

## 2022-03-01 DIAGNOSIS — M542 Cervicalgia: Secondary | ICD-10-CM | POA: Diagnosis not present

## 2022-03-01 DIAGNOSIS — R519 Headache, unspecified: Secondary | ICD-10-CM | POA: Diagnosis not present

## 2022-03-01 DIAGNOSIS — N1832 Chronic kidney disease, stage 3b: Secondary | ICD-10-CM | POA: Diagnosis not present

## 2022-03-01 DIAGNOSIS — E785 Hyperlipidemia, unspecified: Secondary | ICD-10-CM | POA: Diagnosis not present

## 2022-03-01 DIAGNOSIS — E1169 Type 2 diabetes mellitus with other specified complication: Secondary | ICD-10-CM | POA: Diagnosis not present

## 2022-03-01 MED ORDER — LORAZEPAM 1 MG PO TABS: ORAL_TABLET | ORAL | 5 refills | Status: DC

## 2022-03-01 MED ORDER — METFORMIN HCL ER 500 MG PO TB24: 500.0000 mg | ORAL_TABLET | Freq: Every day | ORAL | 1 refills | Status: DC

## 2022-03-01 NOTE — Progress Notes (Signed)
   Subjective:    Patient ID: Jacqueline Morgan, female    DOB: 1941/02/27, 81 y.o.   MRN: [id number]  Hyperlipidemia This is a chronic problem. Treatments tried: rosuvastatin.   Right side neck and ear discomfort  Neck discomfort  Frequent headaches - Plan: Basic Metabolic Panel, Hemoglobin A1c  Hyperlipidemia associated with type 2 diabetes mellitus ([place]) - Plan: Basic Metabolic Panel, Hemoglobin A1c  Tremor, essential  GAD (generalized anxiety disorder)  Diabetes mellitus without complication (HCC) - Plan: Basic Metabolic Panel, Hemoglobin A1c  Right knee pain, unspecified chronicity  Patient comes in today for multiple various issues to discuss Having some intermittent neck discomfort she is concerned about the possibility of she also had mild carotid artery disease that she is worried that this could be a sign of that going on there are no stroke symptoms  She does take for cholesterol medicine regular basis She does keep her diabetes under good control  Has chronic anxiety takes her medicine regular basis Insomnia issues takes her medicine at night Intermittent knee pain and discomfort takes Tylenol     Review of Systems     Objective:   Physical Exam General-in no acute distress Eyes-no discharge Lungs-respiratory rate normal, CTA CV-no murmurs,RRR Extremities skin warm dry no edema Neuro grossly normal Behavior normal, alert   She will do lab work before her follow-up visit to recheck her CKD and diabetes     Assessment & Plan:  1. Neck discomfort As for the neck discomfort I would recommend gentle stretches I find no evidence of acute tumor or mass no need to do scan  2. Frequent headaches Patient has intermittent headaches she has been having these long time she denies them causing any vomiting do not wake her up in the middle of the night she typically takes Tylenol for if her headaches become more frequent or start waking her up at night or causing  vomiting we will need to do a scan  3. Hyperlipidemia associated with type 2 diabetes mellitus (HCC) Hyperlipidemia-importance of diet, weight control, activity, compliance with medications discussed.   Recent labs reviewed.   Any additional labs or refills ordered.   Importance of keeping under good control discussed. Regular follow-up visits discussed   4. Tremor, essential Patient deals with this the best she can some of the medicines we are thinking about interact with her Coumadin  5. GAD (generalized anxiety disorder) She does take her Paxil on a regular basis been on this for years in addition to this she uses lorazepam in the evening time to help her with anxiety as well as tremor  6. Diabetes mellitus without complication ([place]) Previous A1c's were good unable to get recent A1c because her veins were so small she will repeat lab work again in 3 months time  7. Right knee pain, unspecified chronicity We will see if her orthopedist will allow for her to do an injection of her knee for knee pain with her being on Coumadin  CKD 3-stable it is permissible for her to stay on her current medicines with the goal of keeping her blood pressure under good control-monitor met 7 closely if creatinine goes up more referral to nephrology.  She can stay on the 500 mg of metformin currently

## 2022-03-03 DIAGNOSIS — L603 Nail dystrophy: Secondary | ICD-10-CM | POA: Diagnosis not present

## 2022-03-03 DIAGNOSIS — E1142 Type 2 diabetes mellitus with diabetic polyneuropathy: Secondary | ICD-10-CM | POA: Diagnosis not present

## 2022-03-03 DIAGNOSIS — L84 Corns and callosities: Secondary | ICD-10-CM | POA: Diagnosis not present

## 2022-03-14 ENCOUNTER — Ambulatory Visit: Payer: Medicare Other | Attending: Cardiology | Admitting: *Deleted

## 2022-03-14 DIAGNOSIS — Z5181 Encounter for therapeutic drug level monitoring: Secondary | ICD-10-CM | POA: Diagnosis not present

## 2022-03-14 DIAGNOSIS — I82621 Acute embolism and thrombosis of deep veins of right upper extremity: Secondary | ICD-10-CM | POA: Diagnosis not present

## 2022-03-14 LAB — POCT INR: INR: 2.4 (ref 2.0–3.0)

## 2022-03-14 NOTE — Patient Instructions (Signed)
Continue warfarin 1/2 tablet daily except 1 tablet on Mondays and Fridays.   Recheck INR 6 weeks

## 2022-03-17 ENCOUNTER — Encounter: Payer: Self-pay | Admitting: Family Medicine

## 2022-03-20 NOTE — Telephone Encounter (Signed)
A letter was dictated Please send Jacqueline Morgan a message that this letter has been dictated if she needs a printed out copy of this letter mailed to her please let us know thank you

## 2022-03-21 ENCOUNTER — Encounter: Payer: Self-pay | Admitting: *Deleted

## 2022-04-11 ENCOUNTER — Encounter: Payer: Self-pay | Admitting: Cardiology

## 2022-04-11 ENCOUNTER — Ambulatory Visit: Payer: Medicare Other | Attending: Cardiology | Admitting: Cardiology

## 2022-04-11 VITALS — BP 140/90 | HR 76 | Ht 60.0 in | Wt 160.4 lb

## 2022-04-11 DIAGNOSIS — I742 Embolism and thrombosis of arteries of the upper extremities: Secondary | ICD-10-CM | POA: Diagnosis not present

## 2022-04-11 DIAGNOSIS — R030 Elevated blood-pressure reading, without diagnosis of hypertension: Secondary | ICD-10-CM

## 2022-04-11 DIAGNOSIS — R0609 Other forms of dyspnea: Secondary | ICD-10-CM

## 2022-04-11 DIAGNOSIS — E782 Mixed hyperlipidemia: Secondary | ICD-10-CM

## 2022-04-11 NOTE — Patient Instructions (Addendum)
Medication Instructions:  Continue all current medications.  Labwork: none  Testing/Procedures: none  Follow-Up: 6 months   Any Other Special Instructions Will Be Listed Below (If Applicable). Please notify the office at the end of the week with BP readings.   If you need a refill on your cardiac medications before your next appointment, please call your pharmacy.

## 2022-04-11 NOTE — Progress Notes (Signed)
Clinical Summary Jacqueline Morgan is a 82 y.o.female seen today for follow up of the following medical problems.   1. Left brachial artery occlusion   - admit 12/2012 with left arm pain, found to have occlusion of the left brachial artery.   - underwent left arm thromboembolectomy 12/19/12 by Dr Jacqueline Morgan   - echo was negative for thrombus. There was some question of possible aortic arch atherosclerosis. Discharged on coumadin.  - denies any bleeding issues on coumadin. Notes some occsaional pain in left arm with activity that is unchanged..   - has not been in favor of NOACs     - no bleeding on coumadin    2. Hyperlipidemia 10/2017 TC 368 TG 374 HDL 36 LDL 257 9 /2020 TC 372 TG 392 LDL 248  - had tried crestor '5mg'$  every other day a few years ago, did not tolerate due to muscle aches. . She reports side effects on lipitor in the past. She is unsure of her other statin histoyr.     -. She reports palpitations on pravastatin and she stopped taking.    - she reports muscle aches on zetia, stopped taking - muscle aches on crestor twice a week - resistant to pcsk9 inhibitors   05/2020 TC 321 TG 299 HDL 37 LDL 222 - she is back on crestor '10mg'$  again, reports she is tolerating.  - 02/2022 TC 135 TG 166 HDL 44 LDL 63 - continues to tolerate to crestor.   3. DOE - recent symptoms with short distances - chronic cough, wheezing she reports 2017 PFTs normal CXR no acute process BNP 34    10/2015 nuclear no ischemia.   08/2019 echo LVEF 60-65%, grade I DDx, normal RV  07/2019 CXR no acute process  SOB singing in church, showering.    - she has chronic SOB unchanged      Past Medical History:  Diagnosis Date   Anxiety    Complication of anesthesia    passed out after dental surgery   Depression    Fibromyalgia    GERD (gastroesophageal reflux disease)    Hyperlipidemia    Hypertension    Palpitations    Tremor, essential 11/30/2015     Allergies  Allergen Reactions    Codeine     REACTION: Adverse gastrointestinal effects   Statins     Felt bad     Current Outpatient Medications  Medication Sig Dispense Refill   acetaminophen (TYLENOL) 500 MG tablet Take 1,000 mg by mouth every 8 (eight) hours as needed for moderate pain. For headache/pain      blood glucose meter kit and supplies Dispense based on patient and insurance preference. Use to check sugars once per day. (FOR ICD-10 E10.9, E11.9). 1 each 0   fluticasone (FLONASE) 50 MCG/ACT nasal spray Place 2 sprays into both nostrils daily. 16 g 2   glucose blood (ONETOUCH VERIO) test strip USE TO CHECK SUGAR ONCE DAILY DX CODE R73.03, E11.69, E78.5 50 strip 5   Lancets (ONETOUCH DELICA PLUS [id number]) MISC USE TO CHECK SUGAR ONCE DAILY DX CODE R73.03, E11.69, E78.5 100 each 5   LORazepam (ATIVAN) 1 MG tablet TAKE (1/2) TABLET DURING THE DAY AND (1) TABLET AT BEDTIME AS NEEDED. 45 tablet 5   metFORMIN (GLUCOPHAGE-XR) 500 MG 24 hr tablet Take 1 tablet (500 mg total) by mouth daily with breakfast. 90 tablet 1   metoprolol succinate (TOPROL-XL) 25 MG 24 hr tablet 1 qd 90 tablet 3  pantoprazole (PROTONIX) 40 MG tablet Take 1 tablet (40 mg total) by mouth 2 (two) times daily. 180 tablet 1   PARoxetine (PAXIL) 20 MG tablet TAKE 1 TABLET BY MOUTH IN  THE MORNING AND 2 TABLETS  BY MOUTH IN THE EVENING 270 tablet 1   rosuvastatin (CRESTOR) 10 MG tablet Take 1 tablet (10 mg total) by mouth daily. 90 tablet 3   warfarin (COUMADIN) 5 MG tablet TAKE 1/2 -1 TABLET BY MOUTH DAILY AS DIRECTED. 30 tablet 6   No current facility-administered medications for this visit.     Past Surgical History:  Procedure Laterality Date   COLONOSCOPY N/A 04/01/2015   Procedure: COLONOSCOPY;  Surgeon: Jacqueline Houston, MD;  [facility];  Service: Endoscopy;  Laterality: N/A;  1:45   COLONOSCOPY WITH PROPOFOL N/A 10/18/2019   Procedure: COLONOSCOPY WITH PROPOFOL;  Surgeon: Jacqueline Houston, MD;  [facility];   Service: Endoscopy;  Laterality: N/A;  730   EMBOLECTOMY Left 12/19/2012   Procedure: EMBOLECTOMY OF LEFT BRACHIAL ARTERY;  Surgeon: Jacqueline Belmont, MD;  Location: [facility];  Service: Vascular;  Laterality: Left;   EYE SURGERY     POLYPECTOMY  10/18/2019   Procedure: POLYPECTOMY;  Surgeon: Jacqueline Houston, MD;  [facility];  Service: Endoscopy;;     Allergies  Allergen Reactions   Codeine     REACTION: Adverse gastrointestinal effects   Statins     Felt bad      Family History  Problem Relation Age of Onset   Cancer Mother        ovarian, colon   Heart disease Mother    Heart attack Father    Glaucoma Father    COPD Sister    Benign prostatic hyperplasia Brother      Social History Ms. Kunkler reports that she has never smoked. She has never been exposed to tobacco smoke. She has never used smokeless tobacco. Ms. Laden reports no history of alcohol use.   Review of Systems CONSTITUTIONAL: No weight loss, fever, chills, weakness or fatigue.  HEENT: Eyes: No visual loss, blurred vision, double vision or yellow sclerae.No hearing loss, sneezing, congestion, runny nose or sore throat.  SKIN: No rash or itching.  CARDIOVASCULAR: per hpi RESPIRATORY: No shortness of breath, cough or sputum.  GASTROINTESTINAL: No anorexia, nausea, vomiting or diarrhea. No abdominal pain or blood.  GENITOURINARY: No burning on urination, no polyuria NEUROLOGICAL: No headache, dizziness, syncope, paralysis, ataxia, numbness or tingling in the extremities. No change in bowel or bladder control.  MUSCULOSKELETAL: No muscle, back pain, joint pain or stiffness.  LYMPHATICS: No enlarged nodes. No history of splenectomy.  PSYCHIATRIC: No history of depression or anxiety.  ENDOCRINOLOGIC: No reports of sweating, cold or heat intolerance. No polyuria or polydipsia.  [name]   Physical Examination Vitals:   04/11/22 1432 04/11/22 1524  BP: (!) 156/90 (!) 140/90  Pulse: 76   SpO2: 97%     Filed Weights   04/11/22 1432  Weight: 160 lb 6.4 oz (72.8 kg)    Gen: resting comfortably, no acute distress HEENT: no scleral icterus, pupils equal round and reactive, no palptable cervical adenopathy,  CV: RRR, no m/r/g, no jvd Resp: Clear to auscultation bilaterally GI: abdomen is soft, non-tender, non-distended, normal bowel sounds, no hepatosplenomegaly MSK: extremities are warm, no edema.  Skin: warm, no rash Neuro:  no focal deficits Psych: appropriate affect   Diagnostic Studies  05/2013 21 day Event monitor No arrythmias  07/2015 echo Study Conclusions   - Left ventricle: The cavity size was normal. Wall thickness was   increased in a pattern of mild LVH. Systolic function was normal.   The estimated ejection fraction was in the range of 60% to 65%.   Wall motion was normal; there were no regional wall motion   abnormalities. Doppler parameters are consistent with abnormal   left ventricular relaxation (grade 1 diastolic dysfunction). - Aortic valve: Mildly calcified annulus. Trileaflet; mildly   thickened leaflets. Valve area (VTI): 2.21 cm^2. Valve area   (Vmax): 2.35 cm^2. - Mitral valve: Mildly calcified annulus. Mildly thickened leaflets   . - Technically adequate study.   07/2015 LUE Arterial US IMPRESSION: Left upper extremity arterial noninvasive study demonstrates no evidence of significant arterial occlusive disease.   10/2015 Lexiscan MPI There was no ST segment deviation noted during stress. The study is normal. There are no perfusion defects consistent with prior infarct or current ischemia. This is a low risk study. The left ventricular ejection fraction is normal (55-65%).   08/2019 echo IMPRESSIONS     1. Left ventricular ejection fraction, by estimation, is 60 to 65%. The  left ventricle has normal function. Left ventricular endocardial border  not optimally defined to evaluate regional wall motion. There is mild left  ventricular  hypertrophy of the  posterior segment. Left ventricular diastolic parameters are consistent  with Grade I diastolic dysfunction (impaired relaxation).   2. Right ventricular systolic function is normal. The right ventricular  size is normal. There is normal pulmonary artery systolic pressure.   3. The mitral valve is degenerative. Trivial mitral valve regurgitation.   4. The aortic valve is tricuspid. Aortic valve regurgitation is not  visualized. No aortic stenosis is present.     Assessment and Plan   1. Left brachial artery occlusion   - secondary to thromboembolism, unclear source of the embolism (cardiac vs aortic atheroscerlosis)   -she has not been interested in NOACs - continue coumarin   2. Hyperlipidemia -side effects on statins and zetia - turn down pcsk9 inhibitors -has tolerated crestor recently with LDL at goal, continue current meds   3. DOE - extensive cardaic testing over the years has been overall benign - no plans for repeat testing at this time -suspect severe deconditioning  4. Elevated bp - elevated bp today, recent pcp visit was at goal - update [place] on home bp's end of the week, likely would start norvasc if needed       Jacqueline Morgan, M.D.

## 2022-04-18 ENCOUNTER — Encounter: Payer: Self-pay | Admitting: Cardiology

## 2022-04-19 NOTE — Telephone Encounter (Signed)
Borderline bp, would just monitor at this time. Work to limit sodium intake to less than '2000mg'$  daily   Zandra Abts MD

## 2022-04-25 ENCOUNTER — Ambulatory Visit: Payer: Medicare Other | Attending: Cardiology | Admitting: *Deleted

## 2022-04-25 DIAGNOSIS — I82621 Acute embolism and thrombosis of deep veins of right upper extremity: Secondary | ICD-10-CM

## 2022-04-25 DIAGNOSIS — Z5181 Encounter for therapeutic drug level monitoring: Secondary | ICD-10-CM | POA: Diagnosis not present

## 2022-04-25 LAB — POCT INR: INR: 2.2 (ref 2.0–3.0)

## 2022-04-25 NOTE — Patient Instructions (Signed)
Continue warfarin 1/2 tablet daily except 1 tablet on Mondays and Fridays.   Recheck INR 6 weeks

## 2022-05-14 ENCOUNTER — Other Ambulatory Visit: Payer: Self-pay | Admitting: Family Medicine

## 2022-05-14 DIAGNOSIS — E1169 Type 2 diabetes mellitus with other specified complication: Secondary | ICD-10-CM

## 2022-05-16 NOTE — Telephone Encounter (Signed)
May have 90-day with 1 refill on all of these

## 2022-05-17 DIAGNOSIS — E1142 Type 2 diabetes mellitus with diabetic polyneuropathy: Secondary | ICD-10-CM | POA: Diagnosis not present

## 2022-05-17 DIAGNOSIS — L84 Corns and callosities: Secondary | ICD-10-CM | POA: Diagnosis not present

## 2022-05-17 DIAGNOSIS — L603 Nail dystrophy: Secondary | ICD-10-CM | POA: Diagnosis not present

## 2022-06-06 ENCOUNTER — Ambulatory Visit: Payer: Medicare Other | Attending: Cardiology | Admitting: *Deleted

## 2022-06-06 DIAGNOSIS — I82621 Acute embolism and thrombosis of deep veins of right upper extremity: Secondary | ICD-10-CM | POA: Insufficient documentation

## 2022-06-06 DIAGNOSIS — Z5181 Encounter for therapeutic drug level monitoring: Secondary | ICD-10-CM | POA: Diagnosis not present

## 2022-06-06 LAB — POCT INR: INR: 3.6 — AB (ref 2.0–3.0)

## 2022-06-06 NOTE — Patient Instructions (Signed)
Hold warfarin tonight then continue 1/2 tablet daily except 1 tablet on Mondays and Fridays.   Recheck INR 4 weeks

## 2022-06-24 DIAGNOSIS — E1169 Type 2 diabetes mellitus with other specified complication: Secondary | ICD-10-CM | POA: Diagnosis not present

## 2022-06-24 DIAGNOSIS — R519 Headache, unspecified: Secondary | ICD-10-CM | POA: Diagnosis not present

## 2022-06-24 DIAGNOSIS — E119 Type 2 diabetes mellitus without complications: Secondary | ICD-10-CM | POA: Diagnosis not present

## 2022-06-24 DIAGNOSIS — E785 Hyperlipidemia, unspecified: Secondary | ICD-10-CM | POA: Diagnosis not present

## 2022-06-25 LAB — BASIC METABOLIC PANEL
BUN/Creatinine Ratio: 15 (ref 12–28)
BUN: 16 mg/dL (ref 8–27)
CO2: 23 mmol/L (ref 20–29)
Calcium: 9.7 mg/dL (ref 8.7–10.3)
Chloride: 100 mmol/L (ref 96–106)
Creatinine, Ser: 1.1 mg/dL — ABNORMAL HIGH (ref 0.57–1.00)
Glucose: 95 mg/dL (ref 70–99)
Potassium: 5.1 mmol/L (ref 3.5–5.2)
Sodium: 140 mmol/L (ref 134–144)
eGFR: 50 mL/min/{1.73_m2} — ABNORMAL LOW (ref >59)

## 2022-06-25 LAB — HEMOGLOBIN A1C
Est. average glucose Bld gHb Est-mCnc: 143 mg/dL
Hgb A1c MFr Bld: 6.6 % — ABNORMAL HIGH (ref 4.8–5.6)

## 2022-06-30 ENCOUNTER — Encounter: Payer: Self-pay | Admitting: Family Medicine

## 2022-06-30 ENCOUNTER — Ambulatory Visit (INDEPENDENT_AMBULATORY_CARE_PROVIDER_SITE_OTHER): Payer: Medicare Other | Admitting: Family Medicine

## 2022-06-30 VITALS — BP 124/76 | Ht 60.0 in | Wt 155.0 lb

## 2022-06-30 DIAGNOSIS — G250 Essential tremor: Secondary | ICD-10-CM | POA: Diagnosis not present

## 2022-06-30 DIAGNOSIS — H6121 Impacted cerumen, right ear: Secondary | ICD-10-CM | POA: Diagnosis not present

## 2022-06-30 DIAGNOSIS — N3000 Acute cystitis without hematuria: Secondary | ICD-10-CM | POA: Diagnosis not present

## 2022-06-30 DIAGNOSIS — I10 Essential (primary) hypertension: Secondary | ICD-10-CM

## 2022-06-30 DIAGNOSIS — E1122 Type 2 diabetes mellitus with diabetic chronic kidney disease: Secondary | ICD-10-CM

## 2022-06-30 DIAGNOSIS — R300 Dysuria: Secondary | ICD-10-CM

## 2022-06-30 DIAGNOSIS — F411 Generalized anxiety disorder: Secondary | ICD-10-CM

## 2022-06-30 DIAGNOSIS — N1832 Chronic kidney disease, stage 3b: Secondary | ICD-10-CM | POA: Diagnosis not present

## 2022-06-30 DIAGNOSIS — E785 Hyperlipidemia, unspecified: Secondary | ICD-10-CM

## 2022-06-30 DIAGNOSIS — E1169 Type 2 diabetes mellitus with other specified complication: Secondary | ICD-10-CM

## 2022-06-30 MED ORDER — LORAZEPAM 1 MG PO TABS: ORAL_TABLET | ORAL | 5 refills | Status: DC

## 2022-06-30 MED ORDER — SUCRALFATE 1 G PO TABS: ORAL_TABLET | ORAL | 5 refills | Status: DC

## 2022-06-30 MED ORDER — NITROFURANTOIN MACROCRYSTAL 100 MG PO CAPS: ORAL_CAPSULE | ORAL | 0 refills | Status: DC

## 2022-06-30 NOTE — Progress Notes (Signed)
Subjective:    Patient ID: Jacqueline Morgan, female    DOB: 1940-08-01, 82 y.o.   MRN: [id number]  HPI Patient arrives today for follow up for blood work. Tremor, essential - Plan: Ambulatory referral to Neurology  Hyperlipidemia associated with type 2 diabetes mellitus ([place])  Essential hypertension  Type 2 diabetes mellitus with stage 3b chronic kidney disease, without long-term current use of insulin (HCC)  GAD (generalized anxiety disorder)  Impacted cerumen of right ear  Dysuria  Acute cystitis without hematuria  Patient has had a long history of essential tremor.  Now it is becoming worse as she gets older.  It impedes her ability to feed makes it difficult for her to get dressed.  Difficult for her to bathe.  She is not confident with her walking.  She denies muscle weakness.  She is currently on metoprolol.  Patient has deferred trying propranolol. She is followed by cardiology  Patient states right ear feel full (pressure).  She has a history of hypertension She also has a history of anxiety disorder.  Takes lorazepam half tablet during the day and a full tablet in the evening She also has diabetes The patient was seen today as part of a comprehensive diabetic check up. Patient has diabetes Patient relates good compliance with taking the medication. We discussed their diet and exercise activities  We also discussed the importance of notifying us if any excessively high glucoses or low sugars.   Patient here for follow-up regarding cholesterol.    Patient relates taking medication on a regular basis Denies problems with medication Importance of dietary measures discussed Regular lab work regarding lipid and liver was checked and if needing additional labs was appropriately ordered    Review of Systems     Objective:   Physical Exam  General-in no acute distress Eyes-no discharge Lungs-respiratory rate normal, CTA CV-no murmurs,RRR Extremities skin warm  dry no edema Neuro grossly normal Behavior normal, alert Severe tremor noted in her arms  Recent labs reviewed with patient    Assessment & Plan:   1. Tremor, essential We did discuss primidone as well as propranolol.  Primidone interacts with warfarin class D therefore avoid.  As for propranolol there is a minor interaction but patient states she would prefer to see neurology first.  We will refer to [facility] neurology Dr. Carles Collet for further input - Ambulatory referral to Neurology  2. Hyperlipidemia associated with type 2 diabetes mellitus (HCC) Hyperlipidemia-importance of diet, weight control, activity, compliance with medications discussed.   Recent labs reviewed.   Any additional labs or refills ordered.   Importance of keeping under good control discussed. Regular follow-up visits discussed   3. Essential hypertension HTN- patient seen for follow-up regarding HTN.   Diet, medication compliance, appropriate labs and refills were completed.   Importance of keeping blood pressure under good control to lessen the risk of complications discussed Regular follow-up visits discussed   4. Type 2 diabetes mellitus with stage 3b chronic kidney disease, without long-term current use of insulin ([place]) The patient was seen today as part of a comprehensive visit for diabetes. The importance of keeping her A1c at or below 7 range was discussed.  Discussed diet, activity, and medication compliance Emphasized healthy eating primarily with vegetables fruits and if utilizing meats lean meats such as chicken or fish grilled baked broiled Avoid sugary drinks Minimize and avoid processed foods Fit in regular physical activity preferably 25 to 30 minutes 4 times per week Standard follow-up  visit recommended.  Patient aware lack of control and follow-up increases risk of diabetic complications. Regular follow-up visits Yearly ophthalmology Yearly foot exam   5. GAD (generalized anxiety  disorder) Continue antidepressant continue nerve medication  6. Impacted cerumen of right ear Moderate cerumen impaction recommend nurse visit for irrigation  7. Dysuria WBCs are noted we will go ahead with Macrodantin  8. Acute cystitis without hematuria Macrodantin manage plan warning signs discussed Follow-up within 4 to 5 months

## 2022-07-04 ENCOUNTER — Other Ambulatory Visit: Payer: Self-pay | Admitting: Cardiology

## 2022-07-04 ENCOUNTER — Ambulatory Visit: Payer: Medicare Other | Attending: Cardiology | Admitting: *Deleted

## 2022-07-04 DIAGNOSIS — I82621 Acute embolism and thrombosis of deep veins of right upper extremity: Secondary | ICD-10-CM | POA: Diagnosis not present

## 2022-07-04 DIAGNOSIS — Z5181 Encounter for therapeutic drug level monitoring: Secondary | ICD-10-CM | POA: Insufficient documentation

## 2022-07-04 LAB — POCT INR: INR: 2.6 (ref 2.0–3.0)

## 2022-07-04 NOTE — Telephone Encounter (Signed)
Refill request for warfarin:  Last INR was 3.6 on 06/06/22 Next INR due 07/04/22 LOV was 04/11/22  Zandra Abts MD  Refill approved.

## 2022-07-04 NOTE — Patient Instructions (Signed)
Continue warfarin 1/2 tablet daily except 1 tablet on Mondays and Fridays.   Recheck INR 4 weeks  On Macrodantin x 2 more days for UTI

## 2022-07-05 ENCOUNTER — Encounter: Payer: Self-pay | Admitting: Neurology

## 2022-07-07 ENCOUNTER — Ambulatory Visit: Payer: Medicare Other

## 2022-07-17 ENCOUNTER — Other Ambulatory Visit: Payer: Self-pay | Admitting: Family Medicine

## 2022-07-28 DIAGNOSIS — L603 Nail dystrophy: Secondary | ICD-10-CM | POA: Diagnosis not present

## 2022-07-28 DIAGNOSIS — L84 Corns and callosities: Secondary | ICD-10-CM | POA: Diagnosis not present

## 2022-07-28 DIAGNOSIS — E1142 Type 2 diabetes mellitus with diabetic polyneuropathy: Secondary | ICD-10-CM | POA: Diagnosis not present

## 2022-08-01 ENCOUNTER — Ambulatory Visit: Payer: Medicare Other | Attending: Cardiology | Admitting: *Deleted

## 2022-08-01 DIAGNOSIS — Z5181 Encounter for therapeutic drug level monitoring: Secondary | ICD-10-CM

## 2022-08-01 DIAGNOSIS — I82621 Acute embolism and thrombosis of deep veins of right upper extremity: Secondary | ICD-10-CM | POA: Diagnosis not present

## 2022-08-01 LAB — POCT INR: INR: 2.5 (ref 2.0–3.0)

## 2022-08-01 NOTE — Patient Instructions (Signed)
Continue warfarin 1/2 tablet daily except 1 tablet on Mondays and Fridays.   Recheck INR 5 weeks

## 2022-08-15 NOTE — Progress Notes (Unsigned)
Assessment/Plan:   1.  Essential Tremor.  -This is evidenced by the symmetrical nature and longstanding hx of gradually getting worse.  We discussed nature and pathophysiology.  We discussed that this can continue to gradually get worse with time.  We discussed that some medications can worsen this, as can caffeine use.  We discussed medication therapy as well as surgical therapy.  Ultimately, the patient decided to ***.  We discussed that there is an interaction with Coumadin, but as long as it is being monitored with INR, then this should not be an issue.  It is generally not an issue down at very low dosages anyway, which is where we would start.   Subjective:   Jacqueline Morgan was seen in consultation in the movement disorder clinic at the request of Luking, Jonna Coup, MD.  The evaluation is for tremor.  Patient saw Dr. Lesia Sago many years ago for the same (2017).  Notes available to me are reviewed.  While we discussed tremor saw him, this visit was not really focused on tremor and no treatments were discussed or initiated.  She did see her primary care physician March 28 and they did discuss tremor.  Patient is currently 82 years old with history of anxiety disorder, fibromyalgia, hypertension, hyperlipidemia, left brachial artery occlusion in 2014 (now on Coumadin) who presents to discuss further options for tremor.  Tremor started approximately *** ago and involves the ***.  Tremor is most noticeable when ***.   There is *** family hx of tremor.    Affected by caffeine:  {yes no:314532} Affected by alcohol:  {yes no:314532} Affected by stress:  {yes no:314532} Affected by fatigue:  {yes no:314532} Spills soup if on spoon:  {yes no:314532} Spills glass of liquid if full:  {yes no:314532} Affects ADL's (tying shoes, brushing teeth, etc):  {yes no:314532}  Current/Previously tried tremor medications: ***On lorazepam for anxiety, half tablet in the morning and full tablet in the  evening; metoprolol, 25 mg daily  Current medications that may exacerbate tremor:  ***  Outside reports reviewed: {Outside review:15817}.  Allergies  Allergen Reactions   Codeine     REACTION: Adverse gastrointestinal effects   Statins     Felt bad    No outpatient medications have been marked as taking for the 08/17/22 encounter (Appointment) with Tattianna Schnarr, Octaviano Batty, DO.      Objective:   VITALS:  There were no vitals filed for this visit. Gen:  Appears stated age and in NAD. HEENT:  Normocephalic, atraumatic. The mucous membranes are moist. The superficial temporal arteries are without ropiness or tenderness. Cardiovascular: Regular rate and rhythm. Lungs: Clear to auscultation bilaterally. Neck: There are no carotid bruits noted bilaterally.  NEUROLOGICAL:  Orientation:  The patient is alert and oriented x 3.   Cranial nerves: There is good facial symmetry. Extraocular muscles are intact and visual fields are full to confrontational testing. Speech is fluent and clear. Soft palate rises symmetrically and there is no tongue deviation. Hearing is intact to conversational tone. Tone: Tone is good throughout. Sensation: Sensation is intact to light touch touch throughout (facial, trunk, extremities). Vibration is intact at the bilateral big toe. There is no extinction with double simultaneous stimulation. There is no sensory dermatomal level identified. Coordination:  The patient has no dysdiadichokinesia or dysmetria. Motor: Strength is 5/5 in the bilateral upper and lower extremities.  Shoulder shrug is equal bilaterally.  There is no pronator drift.  There are no fasciculations noted. DTR's: Deep  tendon reflexes are 2/4 at the bilateral biceps, triceps, brachioradialis, patella and achilles.  Plantar responses are downgoing bilaterally. Gait and Station: The patient is able to ambulate without difficulty. The patient is able to heel toe walk without any difficulty. The patient is  able to ambulate in a tandem fashion. The patient is able to stand in the Romberg position.   MOVEMENT EXAM: Tremor:  There is *** tremor in the UE, noted most significantly with action.  The patient is *** able to draw Archimedes spirals without significant difficulty.  There is *** tremor at rest.  The patient is *** able to pour water from one glass to another without spilling it.  I have reviewed and interpreted the following labs independently   Chemistry      Component Value Date/Time   NA 140 06/24/2022 1104   K 5.1 06/24/2022 1104   CL 100 06/24/2022 1104   CO2 23 06/24/2022 1104   BUN 16 06/24/2022 1104   CREATININE 1.10 (H) 06/24/2022 1104   CREATININE 1.17 (H) 09/03/2019 1417      Component Value Date/Time   CALCIUM 9.7 06/24/2022 1104   ALKPHOS 92 02/22/2022 1118   AST 22 02/22/2022 1118   ALT 14 02/22/2022 1118   BILITOT 0.3 02/22/2022 1118      Lab Results  Component Value Date   WBC 7.8 08/17/2021   HGB 13.7 08/17/2021   HCT 42.0 08/17/2021   MCV 88 08/17/2021   PLT 333 08/17/2021   Lab Results  Component Value Date   TSH 0.446 (L) 11/16/2020      Total time spent on today's visit was ***60 minutes, including both face-to-face time and nonface-to-face time.  Time included that spent on review of records (prior notes available to me/labs/imaging if pertinent), discussing treatment and goals, answering patient's questions and coordinating care.  CC:  Babs Sciara, MD

## 2022-08-17 ENCOUNTER — Encounter: Payer: Self-pay | Admitting: Neurology

## 2022-08-17 ENCOUNTER — Ambulatory Visit (INDEPENDENT_AMBULATORY_CARE_PROVIDER_SITE_OTHER): Payer: Medicare Other | Admitting: Neurology

## 2022-08-17 VITALS — BP 118/72 | HR 65 | Ht 60.0 in | Wt 155.4 lb

## 2022-08-17 DIAGNOSIS — M542 Cervicalgia: Secondary | ICD-10-CM

## 2022-08-17 DIAGNOSIS — R292 Abnormal reflex: Secondary | ICD-10-CM

## 2022-08-17 DIAGNOSIS — G250 Essential tremor: Secondary | ICD-10-CM

## 2022-08-17 MED ORDER — PRIMIDONE 50 MG PO TABS
50.0000 mg | ORAL_TABLET | Freq: Every day | ORAL | 1 refills | Status: DC
Start: 2022-08-17 — End: 2022-11-02

## 2022-08-17 NOTE — Patient Instructions (Addendum)
Start primidone 50 mg - 1/2 tablet at bedtime for 1 week and then increase to 1 tablet at bedtime x 1 week, then 1 tablet twice per day thereafter.  Make sure you are having your INR monitored frequently   We discussed ordering the MRI cervical spine at [facility]  We discussed primidone for tremor.  We also discussed Deep brain stimulation (DBS) and focused ultrasound for the treatment of tremor.   The physicians and staff at [facility] are committed to providing excellent care. You may receive a survey requesting feedback about your experience at our office. We strive to receive "very good" responses to the survey questions. If you feel that your experience would prevent you from giving the office a "very good " response, please contact our office to try to remedy the situation. We may be reached at [phone]. Thank you for taking the time out of your busy day to complete the survey.

## 2022-08-31 ENCOUNTER — Encounter: Payer: Medicare Other | Admitting: Family Medicine

## 2022-08-31 DIAGNOSIS — H401131 Primary open-angle glaucoma, bilateral, mild stage: Secondary | ICD-10-CM | POA: Diagnosis not present

## 2022-08-31 DIAGNOSIS — Z961 Presence of intraocular lens: Secondary | ICD-10-CM | POA: Diagnosis not present

## 2022-09-05 ENCOUNTER — Ambulatory Visit: Payer: Medicare Other | Admitting: *Deleted

## 2022-09-05 ENCOUNTER — Telehealth: Payer: Self-pay | Admitting: Cardiology

## 2022-09-05 ENCOUNTER — Ambulatory Visit: Payer: Medicare Other | Attending: Cardiology | Admitting: *Deleted

## 2022-09-05 ENCOUNTER — Encounter: Payer: Medicare Other | Admitting: Family Medicine

## 2022-09-05 DIAGNOSIS — Z5181 Encounter for therapeutic drug level monitoring: Secondary | ICD-10-CM | POA: Diagnosis not present

## 2022-09-05 DIAGNOSIS — I82621 Acute embolism and thrombosis of deep veins of right upper extremity: Secondary | ICD-10-CM | POA: Diagnosis not present

## 2022-09-05 LAB — POCT INR: INR: 3.3 — AB (ref 2.0–3.0)

## 2022-09-05 NOTE — Telephone Encounter (Signed)
See nurse visit notes

## 2022-09-05 NOTE — Patient Instructions (Signed)
Hold warfarin tonight then resume 1/2 tablet daily except 1 tablet on Mondays and Fridays.   Recheck INR 3 weeks

## 2022-09-05 NOTE — Telephone Encounter (Signed)
Pt came in for her coumadin apt and is wanting someone to check her BP- she checked it this morning and it was 143/112.

## 2022-09-05 NOTE — Progress Notes (Signed)
Patient in office for Warfarin check & request BP check.  Stated she tried checking her BP at home earlier and got 143/112 but she has tremors & has a difficult time trying to do alone.  BP check in office was 140/88  80.    She did schedule her 6 mo f/u with Sharlene Dory, NP for 10/10/2022.

## 2022-09-07 NOTE — Progress Notes (Signed)
BP is variable, at multiple recent visits with her other doctors bp was fine. I would hold on changes for now, reevaluate at her f/u  J Jamesyn Moorefield MD

## 2022-09-09 ENCOUNTER — Encounter: Payer: Self-pay | Admitting: *Deleted

## 2022-09-09 NOTE — Progress Notes (Signed)
Replied to patient via mychart

## 2022-09-12 ENCOUNTER — Ambulatory Visit (INDEPENDENT_AMBULATORY_CARE_PROVIDER_SITE_OTHER): Payer: Medicare Other | Admitting: Family Medicine

## 2022-09-12 VITALS — BP 128/62 | HR 75 | Temp 97.9°F | Ht 60.0 in | Wt 153.0 lb

## 2022-09-12 DIAGNOSIS — Z13820 Encounter for screening for osteoporosis: Secondary | ICD-10-CM

## 2022-09-12 DIAGNOSIS — M1711 Unilateral primary osteoarthritis, right knee: Secondary | ICD-10-CM | POA: Diagnosis not present

## 2022-09-12 DIAGNOSIS — Z780 Asymptomatic menopausal state: Secondary | ICD-10-CM

## 2022-09-12 DIAGNOSIS — Z0001 Encounter for general adult medical examination with abnormal findings: Secondary | ICD-10-CM | POA: Diagnosis not present

## 2022-09-12 DIAGNOSIS — Z0000 Encounter for general adult medical examination without abnormal findings: Secondary | ICD-10-CM

## 2022-09-12 NOTE — Progress Notes (Signed)
   Subjective:    Patient ID: LAKEYSHIA TUCKERMAN, female    DOB: 04-21-1940, 82 y.o.   MRN: [id number]  HPI AWV- Annual Wellness Visit Here today for annual wellness Overall does well with dietary measures Has a severely hard time with the tremors Decided not to take the medication that neurology prescribed Patient denies any falls or injuries Denies being depressed Limits her driving to daytime driving locally Relates good compliance with her medicine States lorazepam does help her rest at night helps her when she is anxious but denies it causing her drowsiness  The patient was seen for their annual wellness visit. The patient's past medical history, surgical history, and family history were reviewed. Pertinent vaccines were reviewed ( tetanus, pneumonia, shingles, flu) The patient's medication list was reviewed and updated.  The height and weight were entered.  BMI recorded in electronic record elsewhere  Cognitive screening was completed. Outcome of Mini - Cog: 2   Falls /depression screening electronically recorded within record elsewhere  Current tobacco usage:no (All patients who use tobacco were given written and verbal information on quitting)  Recent listing of emergency department/hospitalizations over the past year were reviewed.  current specialist the patient sees on a regular basis: cardiologist.    Medicare annual wellness visit patient questionnaire was reviewed.  A written screening schedule for the patient for the next 5-10 years was given. Appropriate discussion of followup regarding next visit was discussed.      Review of Systems     Objective:   Physical Exam  General-in no acute distress Eyes-no discharge Lungs-respiratory rate normal, CTA CV-no murmurs,RRR Extremities skin warm dry no edema Neuro grossly normal Behavior normal, alert Significant tremor noted in both hands Patient declines pneumonia vaccine patient declines shingles  vaccine     Assessment & Plan:  1. Encounter for subsequent annual wellness visit (AWV) in Medicare patient Adult wellness-complete.wellness physical was conducted today. Importance of diet and exercise were discussed in detail.  Importance of stress reduction and healthy living were discussed.  In addition to this a discussion regarding safety was also covered.  We also reviewed over immunizations and gave recommendations regarding current immunization needed for age.   In addition to this additional areas were also touched on including: Preventative health exams needed:  Colonoscopy NA  Patient was advised yearly wellness exam   2. Primary osteoarthritis of right knee Referral to orthopedic surgery she would like to see Dr. Devonne Doughty for an injection - Ambulatory referral to Orthopedic Surgery - DG Bone Density  3. Encounter for osteoporosis screening in asymptomatic postmenopausal patient Bone density - [facility] Bone Density  Benign essential tremor-neurology recommende medication but patient deferred due to potential side effects

## 2022-09-13 ENCOUNTER — Telehealth: Payer: Self-pay

## 2022-09-13 NOTE — Telephone Encounter (Signed)
Spoke with patient and informed patient that she has Bone Density Scheduled at [facility] radiology department for 12:00pm on Monday October 10, 2022. Arrive at 11:45 am. Discontinue taking any Calcium 2 days (48 hours prior to procedure). Wear comfortable 2-piece clothing for the procedure. If you would need to reschedule, please call [phone]. Patient verbalized understanding.

## 2022-09-14 ENCOUNTER — Ambulatory Visit (HOSPITAL_COMMUNITY)
Admission: RE | Admit: 2022-09-14 | Discharge: 2022-09-14 | Disposition: A | Discharge status: Home / Self Care | Payer: Medicare Other | Source: Ambulatory Visit | Attending: Neurology | Admitting: Neurology

## 2022-09-14 DIAGNOSIS — R292 Abnormal reflex: Secondary | ICD-10-CM | POA: Insufficient documentation

## 2022-09-14 DIAGNOSIS — M4312 Spondylolisthesis, cervical region: Secondary | ICD-10-CM | POA: Diagnosis not present

## 2022-09-14 DIAGNOSIS — M542 Cervicalgia: Secondary | ICD-10-CM | POA: Insufficient documentation

## 2022-09-20 ENCOUNTER — Encounter: Payer: Self-pay | Admitting: Neurology

## 2022-09-23 ENCOUNTER — Telehealth: Payer: Self-pay

## 2022-09-23 NOTE — Telephone Encounter (Signed)
Patient would like to think about injections and get back to [place]

## 2022-09-26 ENCOUNTER — Encounter: Payer: Self-pay | Admitting: Cardiology

## 2022-09-26 ENCOUNTER — Ambulatory Visit: Payer: Medicare Other | Attending: Cardiology | Admitting: *Deleted

## 2022-09-26 DIAGNOSIS — Z5181 Encounter for therapeutic drug level monitoring: Secondary | ICD-10-CM | POA: Diagnosis not present

## 2022-09-26 DIAGNOSIS — I82621 Acute embolism and thrombosis of deep veins of right upper extremity: Secondary | ICD-10-CM | POA: Insufficient documentation

## 2022-09-26 LAB — POCT INR: INR: 2.2 (ref 2.0–3.0)

## 2022-09-26 NOTE — Patient Instructions (Signed)
Continue warfarin 1/2 tablet daily except 1 tablet on Mondays and Fridays.   Recheck INR 4 weeks  

## 2022-10-10 ENCOUNTER — Ambulatory Visit (INDEPENDENT_AMBULATORY_CARE_PROVIDER_SITE_OTHER): Payer: Medicare Other | Admitting: Nurse Practitioner

## 2022-10-10 ENCOUNTER — Other Ambulatory Visit: Payer: Self-pay | Admitting: Family Medicine

## 2022-10-10 ENCOUNTER — Encounter: Payer: Self-pay | Admitting: Nurse Practitioner

## 2022-10-10 ENCOUNTER — Ambulatory Visit
Admission: RE | Admit: 2022-10-10 | Discharge: 2022-10-10 | Disposition: A | Discharge status: Home / Self Care | Payer: Medicare Other | Source: Ambulatory Visit | Attending: Family Medicine | Admitting: Family Medicine

## 2022-10-10 DIAGNOSIS — E785 Hyperlipidemia, unspecified: Secondary | ICD-10-CM | POA: Insufficient documentation

## 2022-10-10 DIAGNOSIS — Z13820 Encounter for screening for osteoporosis: Secondary | ICD-10-CM | POA: Insufficient documentation

## 2022-10-10 DIAGNOSIS — I10 Essential (primary) hypertension: Secondary | ICD-10-CM | POA: Insufficient documentation

## 2022-10-10 DIAGNOSIS — Z780 Asymptomatic menopausal state: Secondary | ICD-10-CM | POA: Insufficient documentation

## 2022-10-10 DIAGNOSIS — I70208 Unspecified atherosclerosis of native arteries of extremities, other extremity: Secondary | ICD-10-CM

## 2022-10-10 DIAGNOSIS — I6523 Occlusion and stenosis of bilateral carotid arteries: Secondary | ICD-10-CM

## 2022-10-10 DIAGNOSIS — M1711 Unilateral primary osteoarthritis, right knee: Secondary | ICD-10-CM | POA: Insufficient documentation

## 2022-10-10 DIAGNOSIS — E1169 Type 2 diabetes mellitus with other specified complication: Secondary | ICD-10-CM

## 2022-10-10 DIAGNOSIS — M810 Age-related osteoporosis without current pathological fracture: Secondary | ICD-10-CM | POA: Diagnosis not present

## 2022-10-10 NOTE — Patient Instructions (Addendum)
Medication Instructions:  Your physician recommends that you continue on your current medications as directed. Please refer to the Current Medication list given to you today.  Labwork: none  Testing/Procedures: none  Follow-Up: Your physician recommends that you schedule a follow-up appointment in: 6 Months with JB or Philis Nettle  Any Other Special Instructions Will Be Listed Below (If Applicable).  If you need a refill on your cardiac medications before your next appointment, please call your pharmacy.Your physician recommends that you continue on your current medications as directed. Please refer to the Current Medication list given to you today.

## 2022-10-10 NOTE — Progress Notes (Unsigned)
Cardiology Office Note:  .   Date:  10/10/2022 ID:  Jacqueline Morgan, DOB 05/12/1940, MRN [id number] PCP: Babs Sciara, MD  [facility] Providers Cardiologist:  Dina Rich, MD    History of Present Illness: .   Jacqueline Morgan is a 82 y.o. 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   Last seen by Dr. Dina Rich on April 11, 2022. Chronic SHOB was unchanged. BP elevated in office, but was overall well controlled.   Today she presents for 6 month follow-up. She states she is doing well. Denies any chest pain, shortness of breath, palpitations, syncope, presyncope, dizziness, orthopnea, PND, swelling or significant weight changes, acute bleeding, or claudication.   Studies Reviewed: .    Carotid duplex 12/2021:  Summary:  Right Carotid: Velocities in the right ICA are consistent with a 1-39%  stenosis.   Left Carotid: Velocities in the left ICA are consistent with a 1-39%  stenosis.   Vertebrals: Bilateral vertebral arteries demonstrate antegrade flow.  Subclavians: Normal flow hemodynamics were seen in bilateral subclavian               arteries.   *See table(s) above for measurements and observations.    Vascular ultrasound LE venous reflex right 11/2021:  Summary:  Right:  - No evidence of deep vein thrombosis seen in the right lower extremity,  from the common femoral through the popliteal veins.  - No evidence of superficial venous thrombosis in the right lower  extremity.    - Venous reflux is noted in the right common femoral vein.  - Venous reflux is noted in the right greater saphenous vein in the calf.    - Right popliteal fossa complex cystic structure likely represents a  Baker's cyst.  *See table(s) above for measurements and observations.   Echo 08/2019:   1. Left ventricular ejection  fraction, by estimation, is 60 to 65%. The  left ventricle has normal function. Left ventricular endocardial border  not optimally defined to evaluate regional wall motion. There is mild left  ventricular hypertrophy of the  posterior segment. Left ventricular diastolic parameters are consistent  with Grade I diastolic dysfunction (impaired relaxation).   2. Right ventricular systolic function is normal. The right ventricular  size is normal. There is normal pulmonary artery systolic pressure.   3. The mitral valve is degenerative. Trivial mitral valve regurgitation.   4. The aortic valve is tricuspid. Aortic valve regurgitation is not  visualized. No aortic stenosis is present.   Comparison(s): Echocardiogram done 07/16/15 showed an EF of 65%.  Myoview 10/2015:  There was no ST segment deviation noted during stress. The study is normal. There are no perfusion defects consistent with prior infarct or current ischemia. This is a low risk study. The left ventricular ejection fraction is normal (55-65%).  Physical Exam:   VS:  BP 128/80   Pulse 76   Ht 5' (1.524 m)   Wt 155 lb (70.3 kg)   SpO2 94%   BMI 30.27 kg/m    Wt Readings from Last 3 Encounters:  10/10/22 155 lb (70.3 kg)  09/12/22 153 lb (69.4 kg)  08/17/22 155 lb 6.4 oz (70.5 kg)    GEN: Well nourished, well developed in no acute distress, tremors noted on exam NECK: No JVD; No carotid bruits CARDIAC: S1/S2, RRR, no murmurs, rubs, gallops RESPIRATORY:  Clear to auscultation without rales, wheezing or rhonchi  ABDOMEN: Soft, non-tender, non-distended EXTREMITIES:  No edema; No deformity   ASSESSMENT AND PLAN: .    1. HTN BP stable. Discussed to monitor BP at home at least 2 hours after medications and sitting for 5-10 minutes. No medication changes at this time. Heart healthy diet and regular cardiovascular exercise encouraged.   2. HLD Last LDL 08/2021 was 79, managed by PCP. Continue rosuvastatin. Heart healthy diet  and regular cardiovascular exercise encouraged.   3. 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 Embolic source was unclear. [facility]. Last carotid dopplers revealed bilateral ICA stenosis at 1-39%. Denies any symptoms. Continue current medication regimen. Continue to follow-up with [facility].    Dispo: Follow-up with Dr. Dina Rich or APP in 6 months or sooner if anything changes.   Signed, Sharlene Dory, NP

## 2022-10-13 DIAGNOSIS — E1142 Type 2 diabetes mellitus with diabetic polyneuropathy: Secondary | ICD-10-CM | POA: Diagnosis not present

## 2022-10-13 DIAGNOSIS — L84 Corns and callosities: Secondary | ICD-10-CM | POA: Diagnosis not present

## 2022-10-13 DIAGNOSIS — L603 Nail dystrophy: Secondary | ICD-10-CM | POA: Diagnosis not present

## 2022-10-24 ENCOUNTER — Ambulatory Visit: Payer: Medicare Other | Attending: Cardiology | Admitting: *Deleted

## 2022-10-24 DIAGNOSIS — I82621 Acute embolism and thrombosis of deep veins of right upper extremity: Secondary | ICD-10-CM | POA: Diagnosis not present

## 2022-10-24 DIAGNOSIS — Z5181 Encounter for therapeutic drug level monitoring: Secondary | ICD-10-CM

## 2022-10-24 LAB — POCT INR: INR: 2.5 (ref 2.0–3.0)

## 2022-10-24 NOTE — Patient Instructions (Signed)
Continue warfarin 1/2 tablet daily except 1 tablet on Mondays and Fridays.   Recheck INR 6 weeks  

## 2022-10-26 DIAGNOSIS — M25561 Pain in right knee: Secondary | ICD-10-CM | POA: Diagnosis not present

## 2022-11-02 ENCOUNTER — Ambulatory Visit (INDEPENDENT_AMBULATORY_CARE_PROVIDER_SITE_OTHER): Payer: Medicare Other | Admitting: Family Medicine

## 2022-11-02 VITALS — BP 134/82 | HR 78 | Temp 97.9°F | Ht 60.0 in | Wt 152.0 lb

## 2022-11-02 DIAGNOSIS — I10 Essential (primary) hypertension: Secondary | ICD-10-CM

## 2022-11-02 DIAGNOSIS — Z79899 Other long term (current) drug therapy: Secondary | ICD-10-CM | POA: Diagnosis not present

## 2022-11-02 DIAGNOSIS — Z7984 Long term (current) use of oral hypoglycemic drugs: Secondary | ICD-10-CM

## 2022-11-02 DIAGNOSIS — G250 Essential tremor: Secondary | ICD-10-CM

## 2022-11-02 DIAGNOSIS — E785 Hyperlipidemia, unspecified: Secondary | ICD-10-CM

## 2022-11-02 DIAGNOSIS — N1832 Chronic kidney disease, stage 3b: Secondary | ICD-10-CM | POA: Diagnosis not present

## 2022-11-02 DIAGNOSIS — E119 Type 2 diabetes mellitus without complications: Secondary | ICD-10-CM

## 2022-11-02 DIAGNOSIS — E1169 Type 2 diabetes mellitus with other specified complication: Secondary | ICD-10-CM

## 2022-11-02 DIAGNOSIS — E1122 Type 2 diabetes mellitus with diabetic chronic kidney disease: Secondary | ICD-10-CM | POA: Diagnosis not present

## 2022-11-02 DIAGNOSIS — R270 Ataxia, unspecified: Secondary | ICD-10-CM

## 2022-11-02 MED ORDER — LORAZEPAM 1 MG PO TABS: ORAL_TABLET | ORAL | 5 refills | Status: DC

## 2022-11-02 NOTE — Progress Notes (Signed)
  Subjective:     Patient ID: Jacqueline Morgan, female   DOB: 11/17/1940, 82 y.o.   MRN: [id number]  HPI   Review of Systems     Objective:   Physical Exam     Assessment:       Plan:

## 2022-11-02 NOTE — Progress Notes (Signed)
   Subjective:    Patient ID: Jacqueline Morgan, female    DOB: 1940-08-14, 82 y.o.   MRN: [id number]  HPI Pt comes in today for a follow up.  Pt would like to discuss dizziness.  Patient has a persistent feeling of feeling dizzy all the time it is worse with certain movements she also states has a difficult time holding her balance often falling to the left sometimes to the right denies unilateral numbness weakness has had a longstanding history of tremors but now having significant troubles with feeling dizzy she describes dizzy as being off balance Patient states she is taking her medicines Try to keep her diabetes under good control with healthy diet Review of Systems     Objective:   Physical Exam  General-in no acute distress Eyes-no discharge Lungs-respiratory rate normal, CTA CV-no murmurs,RRR Extremities skin warm dry no edema Neuro grossly normal Behavior normal, alert Severe ataxia Romberg is positive finger-to-nose is not good bilateral      Assessment & Plan:  I believe her underlying tremor issue is contributing to this problem but at the same time there could be underlying mini strokes going on that are being undetected therefore I would recommend MRI of the brain due to balance issues and ataxia along with difficult coordination both left and right side  Patient has longstanding tremors and anxiety she uses Paxil she has been on no medication for many years refills will be sent in  1. Ataxia See above - MR Brain W Wo Contrast; Future  2. Tremor, essential See above - MR Brain W Wo Contrast; Future  3. Essential hypertension BP good control - Basic Metabolic Panel  4. Hyperlipidemia associated with type 2 diabetes mellitus (HCC) Check lipid profile - Lipid Panel  5. Diabetes mellitus without complication (HCC) Continue current approach check lab work - Hemoglobin A1c - Basic Metabolic Panel  6. Type 2 diabetes mellitus with stage 3b chronic kidney  disease, without long-term current use of insulin (HCC) Check lab work before next visit healthy diet - Hemoglobin A1c - Basic Metabolic Panel  7. High risk medication use Check labs - Hepatic Function Panel

## 2022-11-18 ENCOUNTER — Ambulatory Visit (HOSPITAL_COMMUNITY)
Admission: RE | Admit: 2022-11-18 | Discharge: 2022-11-18 | Disposition: A | Discharge status: Home / Self Care | Payer: Medicare Other | Source: Ambulatory Visit | Attending: Family Medicine | Admitting: Family Medicine

## 2022-11-18 DIAGNOSIS — R270 Ataxia, unspecified: Secondary | ICD-10-CM | POA: Insufficient documentation

## 2022-11-18 DIAGNOSIS — G250 Essential tremor: Secondary | ICD-10-CM | POA: Diagnosis not present

## 2022-11-18 DIAGNOSIS — I6782 Cerebral ischemia: Secondary | ICD-10-CM | POA: Diagnosis not present

## 2022-11-18 DIAGNOSIS — R519 Headache, unspecified: Secondary | ICD-10-CM | POA: Diagnosis not present

## 2022-11-18 DIAGNOSIS — R251 Tremor, unspecified: Secondary | ICD-10-CM | POA: Diagnosis not present

## 2022-11-18 MED ORDER — GADOBUTROL 1 MMOL/ML IV SOLN
7.0000 mL | Freq: Once | INTRAVENOUS | Status: AC | PRN
  Administered 2022-11-18: 7 mL via INTRAVENOUS

## 2022-11-22 ENCOUNTER — Encounter: Payer: Self-pay | Admitting: Family Medicine

## 2022-11-22 NOTE — Telephone Encounter (Signed)
Nurses It would be very difficult to figure this out based on a short message.  I am uncertain of what potential side effects Jacqueline Morgan is having.  More details would be needed.  If she is worried or concerned and does not want to take the medicine she can simply stop taking the medicine but we would have to have a discussion regarding what to be done regarding sugars  Metformin is a complex medication they can be very beneficial, there are potential side effects, it can be taken even in the face of chronic kidney disease.  To be open to the possibility that patient is not tolerate the medicine I believe the best course of action would be an office visit to discuss all of this in detail but in the meantime if she is concerned about taking the medicine she can simply stop taking the medicine

## 2022-12-01 DIAGNOSIS — Z1231 Encounter for screening mammogram for malignant neoplasm of breast: Secondary | ICD-10-CM | POA: Diagnosis not present

## 2022-12-01 LAB — HM MAMMOGRAPHY

## 2022-12-05 ENCOUNTER — Other Ambulatory Visit: Payer: Self-pay | Admitting: Family Medicine

## 2022-12-06 ENCOUNTER — Ambulatory Visit: Payer: Medicare Other | Attending: Cardiology | Admitting: *Deleted

## 2022-12-06 DIAGNOSIS — Z5181 Encounter for therapeutic drug level monitoring: Secondary | ICD-10-CM

## 2022-12-06 DIAGNOSIS — I82621 Acute embolism and thrombosis of deep veins of right upper extremity: Secondary | ICD-10-CM

## 2022-12-06 LAB — POCT INR: INR: 2.5 (ref 2.0–3.0)

## 2022-12-06 NOTE — Patient Instructions (Signed)
Continue warfarin 1/2 tablet daily except 1 tablet on Mondays and Fridays.   Recheck INR 6 weeks  

## 2022-12-15 DIAGNOSIS — E785 Hyperlipidemia, unspecified: Secondary | ICD-10-CM | POA: Diagnosis not present

## 2022-12-15 DIAGNOSIS — I10 Essential (primary) hypertension: Secondary | ICD-10-CM | POA: Diagnosis not present

## 2022-12-15 DIAGNOSIS — Z79899 Other long term (current) drug therapy: Secondary | ICD-10-CM | POA: Diagnosis not present

## 2022-12-15 DIAGNOSIS — E1122 Type 2 diabetes mellitus with diabetic chronic kidney disease: Secondary | ICD-10-CM | POA: Diagnosis not present

## 2022-12-15 DIAGNOSIS — E1169 Type 2 diabetes mellitus with other specified complication: Secondary | ICD-10-CM | POA: Diagnosis not present

## 2022-12-15 DIAGNOSIS — N1832 Chronic kidney disease, stage 3b: Secondary | ICD-10-CM | POA: Diagnosis not present

## 2022-12-15 DIAGNOSIS — E119 Type 2 diabetes mellitus without complications: Secondary | ICD-10-CM | POA: Diagnosis not present

## 2022-12-20 ENCOUNTER — Other Ambulatory Visit: Payer: Self-pay | Admitting: *Deleted

## 2022-12-20 DIAGNOSIS — I75029 Atheroembolism of unspecified lower extremity: Secondary | ICD-10-CM

## 2022-12-20 DIAGNOSIS — I6523 Occlusion and stenosis of bilateral carotid arteries: Secondary | ICD-10-CM

## 2022-12-22 DIAGNOSIS — E1142 Type 2 diabetes mellitus with diabetic polyneuropathy: Secondary | ICD-10-CM | POA: Diagnosis not present

## 2022-12-22 DIAGNOSIS — L84 Corns and callosities: Secondary | ICD-10-CM | POA: Diagnosis not present

## 2022-12-22 DIAGNOSIS — L603 Nail dystrophy: Secondary | ICD-10-CM | POA: Diagnosis not present

## 2022-12-23 ENCOUNTER — Ambulatory Visit: Payer: Medicare Other | Admitting: Neurology

## 2022-12-27 ENCOUNTER — Ambulatory Visit (INDEPENDENT_AMBULATORY_CARE_PROVIDER_SITE_OTHER)
Admission: RE | Admit: 2022-12-27 | Discharge: 2022-12-27 | Disposition: A | Discharge status: Home / Self Care | Payer: Medicare Other | Source: Ambulatory Visit | Attending: Vascular Surgery | Admitting: Vascular Surgery

## 2022-12-27 ENCOUNTER — Ambulatory Visit (INDEPENDENT_AMBULATORY_CARE_PROVIDER_SITE_OTHER): Payer: Medicare Other | Admitting: Physician Assistant

## 2022-12-27 ENCOUNTER — Ambulatory Visit (HOSPITAL_COMMUNITY)
Admission: RE | Admit: 2022-12-27 | Discharge: 2022-12-27 | Disposition: A | Discharge status: Home / Self Care | Payer: Medicare Other | Source: Ambulatory Visit | Attending: Vascular Surgery | Admitting: Vascular Surgery

## 2022-12-27 VITALS — BP 113/70 | HR 67 | Temp 97.0°F | Ht 60.0 in | Wt 152.0 lb

## 2022-12-27 DIAGNOSIS — I6523 Occlusion and stenosis of bilateral carotid arteries: Secondary | ICD-10-CM | POA: Diagnosis not present

## 2022-12-27 DIAGNOSIS — I872 Venous insufficiency (chronic) (peripheral): Secondary | ICD-10-CM

## 2022-12-27 DIAGNOSIS — I75029 Atheroembolism of unspecified lower extremity: Secondary | ICD-10-CM | POA: Diagnosis not present

## 2022-12-27 NOTE — Progress Notes (Signed)
Office Note   History of Present Illness   Jacqueline Morgan is a 82 y.o. (07/31/1940) female who presents for surveillance of carotid artery stenosis.  She has no prior history of carotid interventions, CVA, or TIA.  She has a remote history of left brachial artery embolic event requiring thromboembolectomy in 2014 by Dr. Imogene Burn. 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  The patient returns today for follow up. She denies any recent strokelike symptoms such as slurred speech, facial droop, sudden visual changes, or sudden weakness/numbness.  She also denies any claudication, rest pain, tissue loss of the lower extremities.  She denies getting to exercise much due to shortness of breath with extended activity.  She takes warfarin and a statin daily.  Current Outpatient Medications  Medication Sig Dispense Refill   acetaminophen (TYLENOL) 500 MG tablet Take 1,000 mg by mouth every 8 (eight) hours as needed for moderate pain. For headache/pain  (Patient not taking: Reported on 12/27/2022)     blood glucose meter kit and supplies Dispense based on patient and insurance preference. Use to check sugars once per day. (FOR ICD-10 E10.9, E11.9). 1 each 0   fluticasone (FLONASE) 50 MCG/ACT nasal spray Place 2 sprays into both nostrils daily. (Patient not taking: Reported on 12/27/2022) 16 g 2   glucose blood (ONETOUCH VERIO) test strip USE TO CHECK SUGAR ONCE DAILY DX CODE R73.03, E11.69, E78.5 50 strip 5   Lancets (ONETOUCH DELICA PLUS LANCET33G) MISC USE TO CHECK SUGAR ONCE DAILY DX CODE R73.03, E11.69, E78.5 100 each 5   LORazepam (ATIVAN) 1 MG tablet TAKE (1/2) TABLET DURING THE DAY AND (1) TABLET AT BEDTIME AS NEEDED. 45 tablet 5   metFORMIN (GLUCOPHAGE-XR) 500 MG 24 hr tablet TAKE 1 TABLET BY MOUTH DAILY  WITH BREAKFAST 90 tablet 3   metoprolol succinate (TOPROL-XL) 25 MG 24 hr tablet TAKE 1 TABLET BY MOUTH ONCE  DAILY 90 tablet 3   pantoprazole (PROTONIX)  40 MG tablet TAKE 1 TABLET BY MOUTH TWICE  DAILY 180 tablet 3   PARoxetine (PAXIL) 20 MG tablet TAKE 1 TABLET BY MOUTH IN THE  MORNING AND 2 TABLETS BY MOUTH  IN THE EVENING 270 tablet 3   rosuvastatin (CRESTOR) 10 MG tablet TAKE 1 TABLET BY MOUTH DAILY 90 tablet 3   sucralfate (CARAFATE) 1 g tablet TAKE 1 TABLET BY MOUTH BEFORE MEALS AND AT BEDTIME. 120 tablet 5   warfarin (COUMADIN) 5 MG tablet TAKE 1/2 -1 TABLET BY MOUTH DAILY AS DIRECTED. 30 tablet 5   No current facility-administered medications for this visit.    REVIEW OF SYSTEMS (negative unless checked):   Cardiac:  []  Chest pain or chest pressure? [x]  Shortness of breath upon activity? []  Shortness of breath when lying flat? []  Irregular heart rhythm?  Vascular:  []  Pain in calf, thigh, or hip brought on by walking? []  Pain in feet at night that wakes you up from your sleep? []  Blood clot in your veins? []  Leg swelling?  Pulmonary:  []  Oxygen at home? []  Productive cough? []  Wheezing?  Neurologic:  []  Sudden weakness in arms or legs? []  Sudden numbness in arms or legs? []  Sudden onset of difficult speaking or slurred speech? []  Temporary loss of vision in one eye? []  Problems with dizziness?  Gastrointestinal:  []  Blood in stool? []  Vomited blood?  Genitourinary:  []  Burning when urinating? []  Blood in urine?  Psychiatric:  []   Major depression  Hematologic:  []  Bleeding problems? []  Problems with blood clotting?  Dermatologic:  []  Rashes or ulcers?  Constitutional:  []  Fever or chills?  Ear/Nose/Throat:  []  Change in hearing? []  Nose bleeds? []  Sore throat?  Musculoskeletal:  []  Back pain? []  Joint pain? []  Muscle pain?   Physical Examination   Vitals:   12/27/22 1106  BP: 113/70  Pulse: 67  Temp: (!) 97 F (36.1 C)  TempSrc: Temporal  SpO2: 91%  Weight: 152 lb (68.9 kg)  Height: 5' (1.524 m)   Body mass index is 29.69 kg/m.  General:  WDWN in NAD; vital signs documented  above Gait: Not observed HENT: WNL, normocephalic Pulmonary: normal non-labored breathing , without rales, rhonchi,  wheezing Cardiac: regular Abdomen: soft, NT, no masses Skin: without rashes Vascular Exam/Pulses: palpable radial pulses bilaterally. DP pulses bilaterally Extremities: without ischemic changes, without gangrene , without cellulitis; without open wounds;  Musculoskeletal: no muscle wasting or atrophy  Neurologic: A&O X 3;  No focal weakness or paresthesias are detected Psychiatric:  The pt has Normal affect.  Non-Invasive Vascular Imaging   Bilateral Carotid Duplex (12/27/2022):  R ICA stenosis:  1-39% R VA:  patent and antegrade L ICA stenosis:  1-39% L VA:  patent and antegrade  ABIs (12/27/2022) +--------+------------------+-----+---------+--------+  Right  Rt Pressure (mmHg)IndexWaveform Comment   +--------+------------------+-----+---------+--------+  [id number]                                      +--------+------------------+-----+---------+--------+  PTA    179               1.01 triphasic          +--------+------------------+-----+---------+--------+  DP     186               1.05 biphasic           +--------+------------------+-----+---------+--------+   +--------+------------------+-----+---------+-------+  Left   Lt Pressure (mmHg)IndexWaveform Comment  +--------+------------------+-----+---------+-------+  [id number]                                     +--------+------------------+-----+---------+-------+  PTA    177               1.00 triphasic         +--------+------------------+-----+---------+-------+  DP     172               0.97 triphasic         +--------+------------------+-----+---------+-------+    Medical Decision Making   Jacqueline Morgan is a 82 y.o. female who presents for surveillance of carotid artery stenosis  Based on the patient's vascular studies, their carotid artery  stenosis is unchanged at 1-39% bilaterally She denies any strokelike symptoms such as slurred speech, facial droop, sudden visual changes, or sudden weakness/numbness.  She denies any claudication, rest pain, or tissue loss of the lower extremities. Her ABIs are 1.01 on the right and 1.00 on the left. She has 2+ left DP pulses  On exam she is neurovascularly intact.  She has palpable and equal radial pulses.  She has no carotid bruits She can follow-up with our office in 1 year with repeat carotid duplex   Loel Dubonnet PA-C [facility] Office: [phone]  Clinic MD: Steve Rattler

## 2023-01-02 ENCOUNTER — Ambulatory Visit: Payer: Medicare Other | Admitting: Family Medicine

## 2023-01-03 ENCOUNTER — Other Ambulatory Visit: Payer: Self-pay | Admitting: Family Medicine

## 2023-01-09 ENCOUNTER — Other Ambulatory Visit: Payer: Self-pay

## 2023-01-09 DIAGNOSIS — I6523 Occlusion and stenosis of bilateral carotid arteries: Secondary | ICD-10-CM

## 2023-01-17 ENCOUNTER — Ambulatory Visit: Payer: Medicare Other | Attending: Cardiology | Admitting: *Deleted

## 2023-01-17 DIAGNOSIS — Z5181 Encounter for therapeutic drug level monitoring: Secondary | ICD-10-CM | POA: Insufficient documentation

## 2023-01-17 DIAGNOSIS — I82621 Acute embolism and thrombosis of deep veins of right upper extremity: Secondary | ICD-10-CM | POA: Diagnosis not present

## 2023-01-17 LAB — POCT INR: INR: 2 (ref 2.0–3.0)

## 2023-01-17 NOTE — Patient Instructions (Signed)
Continue warfarin 1/2 tablet daily except 1 tablet on Mondays and Fridays.   ?Recheck INR 6 weeks  ?

## 2023-01-24 ENCOUNTER — Encounter: Payer: Self-pay | Admitting: *Deleted

## 2023-01-30 ENCOUNTER — Ambulatory Visit (INDEPENDENT_AMBULATORY_CARE_PROVIDER_SITE_OTHER): Payer: Medicare Other | Admitting: Family Medicine

## 2023-01-30 VITALS — BP 102/64 | HR 84 | Temp 97.5°F | Ht 60.0 in | Wt 153.8 lb

## 2023-01-30 DIAGNOSIS — N1832 Chronic kidney disease, stage 3b: Secondary | ICD-10-CM | POA: Diagnosis not present

## 2023-01-30 DIAGNOSIS — R270 Ataxia, unspecified: Secondary | ICD-10-CM | POA: Diagnosis not present

## 2023-01-30 DIAGNOSIS — F411 Generalized anxiety disorder: Secondary | ICD-10-CM | POA: Diagnosis not present

## 2023-01-30 DIAGNOSIS — Z7984 Long term (current) use of oral hypoglycemic drugs: Secondary | ICD-10-CM | POA: Diagnosis not present

## 2023-01-30 DIAGNOSIS — G250 Essential tremor: Secondary | ICD-10-CM | POA: Diagnosis not present

## 2023-01-30 DIAGNOSIS — E1122 Type 2 diabetes mellitus with diabetic chronic kidney disease: Secondary | ICD-10-CM

## 2023-01-30 DIAGNOSIS — M542 Cervicalgia: Secondary | ICD-10-CM | POA: Diagnosis not present

## 2023-01-30 DIAGNOSIS — I10 Essential (primary) hypertension: Secondary | ICD-10-CM | POA: Diagnosis not present

## 2023-01-30 MED ORDER — LORAZEPAM 1 MG PO TABS: ORAL_TABLET | ORAL | 5 refills | Status: DC

## 2023-01-30 NOTE — Progress Notes (Signed)
Subjective:    Patient ID: Jacqueline Morgan, female    DOB: 01-19-1941, 82 y.o.   MRN: [id number]  Discussed the use of AI scribe software for clinical note transcription with the patient, who gave verbal consent to proceed.  History of Present Illness   The patient, with a history of diabetes, reports improved energy levels with the onset of cooler weather. They enjoy walking around stores like [employer] for about an hour, but note that their legs begin to hurt and they feel tired after such activities. They report that their balance has been improving. However, they have noticed that on days when they skip their metformin, their balance seems better. They have been trying to minimize their sugar intake, but they do drink a 16-ounce regular [name] throughout the day. Their morning blood sugars are typically in the 70s and 80s.  The patient also reports that they have been experiencing dizziness and blurry vision, but these symptoms have been improving. They have been taking lorazepam every night before bed and have not needed to take any during the day. They have not had any bleeding issues. They also take metoprolol and Paxil regularly.  The patient has been experiencing swelling in the gland behind their ears intermittently. They also report chronic back pain, which becomes worse after standing for about five minutes and can cause nausea. They have not been doing any stretches for their back. They also report that they have been experiencing blurry vision and dimness in their eyes.  The patient has a history of a small stroke that did not cause any symptoms. They are on a blood thinner and cannot take aspirin. They also have multiple levels of arthritis in their neck. They have not had any other worries or concerns about their health.         Review of Systems     Objective:    Physical Exam   HEENT: Eardrums appear normal with minimal wax. NECK: Cervical spine exhibits crepitus.            Assessment & Plan:  Assessment and Plan    Type 2 Diabetes Mellitus Good glycemic control with A1C of 6.5. Patient reports better balance on days when Metformin is skipped. -Reduce Metformin to half a tablet daily. -Continue dietary modifications and monitor blood glucose levels.  Chronic Back Pain Patient reports inability to stand for more than five minutes due to back pain, leading to nausea. -Encourage patient to continue with gentle physical activity as tolerated. -Provide patient with stretches to help alleviate back pain.  General Health Maintenance -Continue current medications including Paxil, Metoprolol, and Lorazepam as tolerated. -Consider reducing sugar intake, specifically regular [name]. -Plan for follow-up visit in six months (Spring 2025). -Order blood work prior to next visit.  Ear Complaints Patient reports occasional swelling of the gland behind the ears. -Continue monitoring symptoms.  Balance Issues Patient reports improvement in balance. -Continue monitoring and report any worsening of symptoms.  Vision Changes Patient reports blurriness and dimness in vision. -Encourage patient to keep scheduled appointment with eye doctor.  Anticoagulation Patient on Warfarin with no reported bleeding issues. -Continue Warfarin as prescribed.  Arthritis Patient has multiple levels of arthritis in the neck. -Continue Tylenol as needed for pain.  Tremors Patient has history of tremors. -Continue monitoring and report any worsening of symptoms.      1. Ataxia Patient is not had any falls recent MRI reviewed with her she does try to take care when  she moves around her MRI did not show any tumors but did show a previous old stroke same as 2015 she is on Coumadin not a good candidate for aspirin in addition to this carotid arteries some buildup but not bad risk factor management recommended  2. Tremor, essential Lorazepam helps to a degree half tablet  during the day may take a full tablet at night to help with rest she has been on this for years also has underlying anxiety disorder she is already on Paxil  3. Essential hypertension Blood pressure decent contro  4. Type 2 diabetes mellitus with stage 3b chronic kidney disease, without long-term current use of insulin (HCC) Patient would like to reduce the metformin we will allow for her to cut down the metformin to 1/2 tablet/day  5. GAD (generalized anxiety disorder) Continue lorazepam as is  6. Neck discomfort Patient was concerned about neck pain I do not find any masses I think is related to her cervical arthritis from previous MRI  Patient to follow-up within 6 months

## 2023-02-07 ENCOUNTER — Ambulatory Visit: Payer: Medicare Other | Admitting: Family Medicine

## 2023-02-28 ENCOUNTER — Ambulatory Visit: Payer: Medicare Other | Attending: Cardiology | Admitting: *Deleted

## 2023-02-28 DIAGNOSIS — I82621 Acute embolism and thrombosis of deep veins of right upper extremity: Secondary | ICD-10-CM | POA: Diagnosis not present

## 2023-02-28 DIAGNOSIS — Z5181 Encounter for therapeutic drug level monitoring: Secondary | ICD-10-CM | POA: Diagnosis not present

## 2023-02-28 LAB — POCT INR: INR: 3.1 — AB (ref 2.0–3.0)

## 2023-02-28 NOTE — Patient Instructions (Signed)
Continue warfarin 1/2 tablet daily except 1 tablet on Mondays and Fridays.   ?Recheck INR 6 weeks  ?

## 2023-03-09 DIAGNOSIS — L603 Nail dystrophy: Secondary | ICD-10-CM | POA: Diagnosis not present

## 2023-03-09 DIAGNOSIS — E1142 Type 2 diabetes mellitus with diabetic polyneuropathy: Secondary | ICD-10-CM | POA: Diagnosis not present

## 2023-03-09 DIAGNOSIS — L84 Corns and callosities: Secondary | ICD-10-CM | POA: Diagnosis not present

## 2023-03-13 ENCOUNTER — Other Ambulatory Visit: Payer: Self-pay | Admitting: Cardiology

## 2023-03-13 NOTE — Telephone Encounter (Signed)
Refill request for warfarin:  Last INR was 3.1 on 02/28/23 Next INR due 04/10/22 LOV was 10/10/22  Refill approved.

## 2023-03-16 DIAGNOSIS — D3131 Benign neoplasm of right choroid: Secondary | ICD-10-CM | POA: Diagnosis not present

## 2023-03-16 DIAGNOSIS — H04123 Dry eye syndrome of bilateral lacrimal glands: Secondary | ICD-10-CM | POA: Diagnosis not present

## 2023-03-16 DIAGNOSIS — E119 Type 2 diabetes mellitus without complications: Secondary | ICD-10-CM | POA: Diagnosis not present

## 2023-03-16 DIAGNOSIS — H401131 Primary open-angle glaucoma, bilateral, mild stage: Secondary | ICD-10-CM | POA: Diagnosis not present

## 2023-03-16 DIAGNOSIS — H02834 Dermatochalasis of left upper eyelid: Secondary | ICD-10-CM | POA: Diagnosis not present

## 2023-03-16 DIAGNOSIS — H02831 Dermatochalasis of right upper eyelid: Secondary | ICD-10-CM | POA: Diagnosis not present

## 2023-03-16 LAB — HM DIABETES EYE EXAM

## 2023-04-03 ENCOUNTER — Ambulatory Visit (HOSPITAL_COMMUNITY)
Admission: RE | Admit: 2023-04-03 | Discharge: 2023-04-03 | Disposition: A | Discharge status: Home / Self Care | Payer: Medicare Other | Source: Ambulatory Visit | Attending: Nurse Practitioner | Admitting: Nurse Practitioner

## 2023-04-03 ENCOUNTER — Ambulatory Visit
Admission: EM | Admit: 2023-04-03 | Discharge: 2023-04-03 | Disposition: A | Discharge status: Home / Self Care | Payer: Medicare Other | Attending: Nurse Practitioner | Admitting: Nurse Practitioner

## 2023-04-03 ENCOUNTER — Telehealth: Payer: Self-pay

## 2023-04-03 ENCOUNTER — Encounter: Payer: Self-pay | Admitting: Family Medicine

## 2023-04-03 ENCOUNTER — Ambulatory Visit: Payer: Medicare Other | Admitting: Physician Assistant

## 2023-04-03 DIAGNOSIS — R0989 Other specified symptoms and signs involving the circulatory and respiratory systems: Secondary | ICD-10-CM | POA: Diagnosis not present

## 2023-04-03 DIAGNOSIS — R062 Wheezing: Secondary | ICD-10-CM | POA: Diagnosis not present

## 2023-04-03 LAB — POCT INFLUENZA A/B
Influenza A, POC: NEGATIVE
Influenza B, POC: NEGATIVE

## 2023-04-03 MED ORDER — BENZONATATE 100 MG PO CAPS: 100.0000 mg | ORAL_CAPSULE | Freq: Three times a day (TID) | ORAL | 0 refills | Status: AC | PRN

## 2023-04-03 MED ORDER — ALBUTEROL SULFATE HFA 108 (90 BASE) MCG/ACT IN AERS: 1.0000 | INHALATION_SPRAY | Freq: Four times a day (QID) | RESPIRATORY_TRACT | 0 refills | Status: AC | PRN

## 2023-04-03 MED ORDER — DM-GUAIFENESIN ER 30-600 MG PO TB12: 1.0000 | ORAL_TABLET | Freq: Two times a day (BID) | ORAL | 0 refills | Status: AC

## 2023-04-03 NOTE — ED Triage Notes (Addendum)
Pt reports cough, nasal congestion x 2 weeks, has notice chest congestion x 3 days

## 2023-04-03 NOTE — ED Provider Notes (Signed)
[facility]    CSN: [id number] Arrival date & time: 04/03/23  9528      History   Chief Complaint No chief complaint on file.   HPI Jacqueline Morgan is a 82 y.o. female.   HPI  She is in today with a family member for complaints of chest congestion for 3 days.  She reports that she has had a cough with some nasal congestion for 2 weeks.  She is having right ear pain. she has been using Tylenol with little to no relief.  Current headache, dizziness, sore throat have resolved.  Past Medical History:  Diagnosis Date   Anxiety    Complication of anesthesia    passed out after dental surgery   Depression    Fibromyalgia    GERD (gastroesophageal reflux disease)    Hyperlipidemia    Hypertension    Palpitations    Tremor, essential 11/30/2015    Patient Active Problem List   Diagnosis Date Noted   Diabetes mellitus without complication (HCC) 03/01/2022   Type 2 diabetes mellitus with stage 3b chronic kidney disease, without long-term current use of insulin (HCC) 03/01/2022   Hyperlipidemia associated with type 2 diabetes mellitus (HCC) 06/03/2020   History of colonic polyps 06/07/2018   Gastroesophageal reflux disease without esophagitis 06/07/2018   Osteoporosis 01/26/2018   Prediabetes 10/25/2017   GAD (generalized anxiety disorder) 01/27/2017   Subjective visual disturbance 11/30/2015   Tremor, essential 11/30/2015   GERD (gastroesophageal reflux disease) 06/24/2014   Multiple thyroid nodules 06/24/2014   Encounter for therapeutic drug monitoring 05/06/2013   PAD (peripheral artery disease) (HCC) 12/31/2012   DVT of upper extremity (deep vein thrombosis) (HCC) 12/24/2012   Long term (current) use of anticoagulants 12/24/2012   GASTROESOPHAGEAL REFLUX DISEASE 05/19/2009   FIBROMYALGIA 05/19/2009   SYNCOPE 05/19/2009   Hyperglycemia 05/19/2009   Panic attacks 05/07/2009   DEPRESSION 05/07/2009   Essential hypertension 05/07/2009    Past Surgical  History:  Procedure Laterality Date   COLONOSCOPY N/A 04/01/2015   Procedure: COLONOSCOPY;  Surgeon: Malissa Hippo, MD;  [facility]: [facility];  Service: Endoscopy;  Laterality: N/A;  1:45   COLONOSCOPY WITH PROPOFOL N/A 10/18/2019   Procedure: COLONOSCOPY WITH PROPOFOL;  Surgeon: Malissa Hippo, MD;  Location: [facility];  Service: Endoscopy;  Laterality: N/A;  730   EMBOLECTOMY Left 12/19/2012   Procedure: EMBOLECTOMY OF LEFT BRACHIAL ARTERY;  Surgeon: Fransisco Hertz, MD;  Location: [facility] OR;  Service: Vascular;  Laterality: Left;   EYE SURGERY Bilateral    cataract   POLYPECTOMY  10/18/2019   Procedure: POLYPECTOMY;  Surgeon: Malissa Hippo, MD;  Location: [facility];  Service: Endoscopy;;    OB History   No obstetric history on file.      Home Medications    Prior to Admission medications   Medication Sig Start Date End Date Taking? Authorizing Provider  acetaminophen (TYLENOL) 500 MG tablet Take 1,000 mg by mouth every 8 (eight) hours as needed for moderate pain. For headache/pain  Patient not taking: Reported on 12/27/2022    [provider]  blood glucose meter kit and supplies Dispense based on patient and insurance preference. Use to check sugars once per day. (FOR ICD-10 E10.9, E11.9). 02/05/21   Babs Sciara, MD  fluticasone (FLONASE) 50 MCG/ACT nasal spray Place 2 sprays into both nostrils daily. Patient not taking: Reported on 12/27/2022 12/21/21   Babs Sciara, MD  glucose blood (ONETOUCH VERIO) test  strip USE TO CHECK SUGAR ONCE DAILY DX CODE R73.03, E11.69, E78.5 05/18/21   Babs Sciara, MD  Lancets [facility] DELICA PLUS [place]) MISC USE TO CHECK SUGAR ONCE DAILY DX CODE R73.03, E11.69, E78.5 05/18/21   Babs Sciara, MD  LORazepam (ATIVAN) 1 MG tablet TAKE (1/2) TABLET DURING THE DAY AND (1) TABLET AT BEDTIME AS NEEDED. 01/30/23   Babs Sciara, MD  metFORMIN (GLUCOPHAGE-XR) 500 MG 24 hr tablet TAKE 1 TABLET BY MOUTH DAILY  WITH BREAKFAST  12/07/22   Babs Sciara, MD  metoprolol succinate (TOPROL-XL) 25 MG 24 hr tablet TAKE 1 TABLET BY MOUTH ONCE  DAILY 10/11/22   Babs Sciara, MD  pantoprazole (PROTONIX) 40 MG tablet TAKE 1 TABLET BY MOUTH TWICE  DAILY 10/11/22   Babs Sciara, MD  PARoxetine (PAXIL) 20 MG tablet TAKE 1 TABLET BY MOUTH IN THE  MORNING AND 2 TABLETS BY MOUTH  IN THE EVENING 10/11/22   Babs Sciara, MD  rosuvastatin (CRESTOR) 10 MG tablet TAKE 1 TABLET BY MOUTH DAILY 10/11/22   Babs Sciara, MD  sucralfate (CARAFATE) 1 g tablet TAKE 1 TABLET BY MOUTH BEFORE MEALS AND AT BEDTIME. 06/30/22   Babs Sciara, MD  warfarin (COUMADIN) 5 MG tablet TAKE 1/2 -1 TABLET BY MOUTH DAILY AS DIRECTED. 03/13/23   Branch, Dorothe Pea, MD    Family History Family History  Problem Relation Age of Onset   Cancer Mother        ovarian, colon   Heart disease Mother    Heart attack Father    Glaucoma Father    COPD Sister    COPD Brother    Benign prostatic hyperplasia Brother     Social History Social History   Tobacco Use   Smoking status: Never    Passive exposure: Never   Smokeless tobacco: Never  Vaping Use   Vaping status: Never Used  Substance Use Topics   Alcohol use: No   Drug use: No     Allergies   Codeine and Statins   Review of Systems Review of Systems   Physical Exam Triage Vital Signs ED Triage Vitals  Encounter Vitals Group     BP 04/03/23 0944 (!) 144/86     Systolic BP Percentile --      Diastolic BP Percentile --      Pulse Rate 04/03/23 0944 98     Resp 04/03/23 0944 18     Temp 04/03/23 0944 98.4 F (36.9 C)     Temp Source 04/03/23 0944 Oral     SpO2 04/03/23 0944 93 %     Weight --      Height --      Head Circumference --      Peak Flow --      Pain Score 04/03/23 0949 0     Pain Loc --      Pain Education --      Exclude from Growth Chart --    No data found.  Updated Vital Signs BP (!) 144/86 (BP Location: Right Arm)   Pulse 98   Temp 98.4 F (36.9 C) (Oral)    Resp 18   SpO2 93%   Visual Acuity Right Eye Distance:   Left Eye Distance:   Bilateral Distance:    Right Eye Near:   Left Eye Near:    Bilateral Near:     Physical Exam Constitutional:      General: She is not  in acute distress.    Appearance: She is obese.  HENT:     Head: Normocephalic and atraumatic.     Right Ear: Tympanic membrane normal.     Left Ear: Tympanic membrane normal.     Nose: Nose normal.     Mouth/Throat:     Mouth: Mucous membranes are dry.     Pharynx: No oropharyngeal exudate or posterior oropharyngeal erythema.  Eyes:     Pupils: Pupils are equal, round, and reactive to light.  Cardiovascular:     Rate and Rhythm: Normal rate and regular rhythm.     Pulses: Normal pulses.     Heart sounds: Normal heart sounds.  Pulmonary:     Effort: Pulmonary effort is normal.     Breath sounds: Wheezing (Expiratory wheezing) present.  Musculoskeletal:        General: Normal range of motion.     Cervical back: Normal range of motion.  Skin:    General: Skin is warm and dry.     Capillary Refill: Capillary refill takes less than 2 seconds.  Neurological:     Mental Status: She is alert.     Comments: Tremors  Psychiatric:        Mood and Affect: Mood normal.      UC Treatments / Results  Labs (all labs ordered are listed, but only abnormal results are displayed) Labs Reviewed  POCT INFLUENZA A/B - Normal    EKG   Radiology DG Chest 2 View Result Date: 04/03/2023 CLINICAL DATA:  Wheezing and congestion since yesterday. EXAM: CHEST - 2 VIEW COMPARISON:  Chest x-ray dated July 17, 2019. FINDINGS: The heart size and mediastinal contours are within normal limits. Normal pulmonary vascularity. No focal consolidation, pleural effusion, or pneumothorax. No acute osseous abnormality. IMPRESSION: 1. No acute cardiopulmonary disease. Electronically Signed   By: Obie Dredge M.D.   On: 04/03/2023 12:02    Procedures Procedures (including critical  care time)  Medications Ordered in UC Medications - No data to display  Initial Impression / Assessment and Plan / UC Course  I have reviewed the triage vital signs and the nursing notes.  Pertinent labs & imaging results that were available during my care of the patient were reviewed by me and considered in my medical decision making (see chart for details).     Chest congestion Final Clinical Impressions(s) / UC Diagnoses   Final diagnoses:  Wheezing     Discharge Instructions      Your Influenza is negative.  You have been ordered a chest x-ray for the wheezing since this is new symptom.  We will call you once we have the results of the chest x-ray for next steps with treatment options.    She declined a COVID test due to completing home test which was negative.  ED Prescriptions   None    PDMP not reviewed this encounter.   Barbette Merino, [place] 04/03/23 [phone]

## 2023-04-03 NOTE — Telephone Encounter (Signed)
Provider Thad Ranger reviewed results of x-ray and stated "xray was negative. I have prescribed her albuterol, cough medication , benzonatate, and encourage continuing the Mucinex ". Called pt with results, pt verbalized understanding, no questions or concerns at this time.

## 2023-04-03 NOTE — ED Notes (Signed)
Pt did not want a COVID test done today, states she took a home one yesterday and it was negative.

## 2023-04-03 NOTE — Discharge Instructions (Addendum)
Your Influenza is negative.  You have been ordered a chest x-ray for the wheezing since this is new symptom.  We will call you once we have the results of the chest x-ray for next steps with treatment options.

## 2023-04-06 ENCOUNTER — Telehealth: Payer: Medicare Other | Admitting: Nurse Practitioner

## 2023-04-06 DIAGNOSIS — J0140 Acute pansinusitis, unspecified: Secondary | ICD-10-CM | POA: Diagnosis not present

## 2023-04-06 MED ORDER — AMOXICILLIN-POT CLAVULANATE 875-125 MG PO TABS: 1.0000 | ORAL_TABLET | Freq: Two times a day (BID) | ORAL | 0 refills | Status: AC

## 2023-04-06 NOTE — Progress Notes (Signed)
E-Visit for Sinus Problems  We are sorry that you are not feeling well.  Here is how we plan to help!  Based on what you have shared with me it looks like you have sinusitis.  Sinusitis is inflammation and infection in the sinus cavities of the head.  Based on your presentation I believe you most likely have Acute Bacterial Sinusitis.  This is an infection caused by bacteria and is treated with antibiotics. I have prescribed Augmentin 875mg/125mg one tablet twice daily with food, for 7 days.  You may use an oral decongestant such as Mucinex D or if you have glaucoma or high blood pressure use plain Mucinex. Saline nasal spray help and can safely be used as often as needed for congestion.  If you develop worsening sinus pain, fever or notice severe headache and vision changes, or if symptoms are not better after completion of antibiotic, please schedule an appointment with a health care provider.    Sinus infections are not as easily transmitted as other respiratory infection, however we still recommend that you avoid close contact with loved ones, especially the very young and elderly.  Remember to wash your hands thoroughly throughout the day as this is the number one way to prevent the spread of infection!  Home Care: Only take medications as instructed by your medical team. Complete the entire course of an antibiotic. Do not take these medications with alcohol. A steam or ultrasonic humidifier can help congestion.  You can place a towel over your head and breathe in the steam from hot water coming from a faucet. Avoid close contacts especially the very young and the elderly. Cover your mouth when you cough or sneeze. Always remember to wash your hands.  Get Help Right Away If: You develop worsening fever or sinus pain. You develop a severe head ache or visual changes. Your symptoms persist after you have completed your treatment plan.  Make sure you Understand these instructions. Will  watch your condition. Will get help right away if you are not doing well or get worse.  Thank you for choosing an e-visit.  Your e-visit answers were reviewed by a board certified advanced clinical practitioner to complete your personal care plan. Depending upon the condition, your plan could have included both over the counter or prescription medications.  Please review your pharmacy choice. Make sure the pharmacy is open so you can pick up prescription now. If there is a problem, you may contact your provider through MyChart messaging and have the prescription routed to another pharmacy.  Your safety is important to us. If you have drug allergies check your prescription carefully.   For the next 24 hours you can use MyChart to ask questions about today's visit, request a non-urgent call back, or ask for a work or school excuse. You will get an email in the next two days asking about your experience. I hope that your e-visit has been valuable and will speed your recovery.   Meds ordered this encounter  Medications   amoxicillin-clavulanate (AUGMENTIN) 875-125 MG tablet    Sig: Take 1 tablet by mouth 2 (two) times daily for 7 days.    Dispense:  14 tablet    Refill:  0     I spent approximately 5 minutes reviewing the patient's history, current symptoms and coordinating their care today.   

## 2023-04-10 ENCOUNTER — Ambulatory Visit: Payer: Medicare Other | Admitting: Nurse Practitioner

## 2023-04-10 ENCOUNTER — Telehealth: Payer: Self-pay | Admitting: *Deleted

## 2023-04-10 ENCOUNTER — Ambulatory Visit: Payer: Medicare Other | Attending: Cardiology | Admitting: *Deleted

## 2023-04-10 DIAGNOSIS — Z5181 Encounter for therapeutic drug level monitoring: Secondary | ICD-10-CM | POA: Diagnosis not present

## 2023-04-10 DIAGNOSIS — I82621 Acute embolism and thrombosis of deep veins of right upper extremity: Secondary | ICD-10-CM | POA: Diagnosis not present

## 2023-04-10 LAB — POCT INR: INR: 2.8 (ref 2.0–3.0)

## 2023-04-10 NOTE — Patient Instructions (Signed)
 Continue warfarin 1/2 tablet daily except 1 tablet on Mondays and Fridays.   ?Recheck INR 6 weeks  ?

## 2023-04-10 NOTE — Telephone Encounter (Signed)
 Patient called stating that she is sick and is on an antibiotic. She has an appointment today for coumadin. Requested to speak with Misty Stanley.

## 2023-04-10 NOTE — Telephone Encounter (Signed)
 Pt has been on Augmenting since last Wednesday for cough, congestion.  Recommended pt keep INR for today.  She is in agreement.

## 2023-04-18 ENCOUNTER — Ambulatory Visit: Payer: Medicare Other | Attending: Nurse Practitioner | Admitting: Nurse Practitioner

## 2023-04-18 ENCOUNTER — Encounter: Payer: Self-pay | Admitting: Nurse Practitioner

## 2023-04-18 VITALS — BP 118/70 | HR 76 | Ht 60.0 in | Wt 152.0 lb

## 2023-04-18 DIAGNOSIS — E785 Hyperlipidemia, unspecified: Secondary | ICD-10-CM | POA: Diagnosis not present

## 2023-04-18 DIAGNOSIS — I10 Essential (primary) hypertension: Secondary | ICD-10-CM | POA: Diagnosis not present

## 2023-04-18 DIAGNOSIS — I70208 Unspecified atherosclerosis of native arteries of extremities, other extremity: Secondary | ICD-10-CM | POA: Diagnosis not present

## 2023-04-18 DIAGNOSIS — I6523 Occlusion and stenosis of bilateral carotid arteries: Secondary | ICD-10-CM | POA: Diagnosis not present

## 2023-04-18 NOTE — Progress Notes (Signed)
 Cardiology Office Note:  .   Date: 04/18/2023 ID:  Jacqueline Morgan, DOB July 17, 1940, MRN [id number] PCP: Alphonsa Glendia LABOR, MD  [facility] Providers Cardiologist:  Alvan Carrier, MD    History of Present Illness: .   Jacqueline Morgan is a 83 y.o. 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   Last seen by Dr. Carrier Alvan on April 11, 2022. Chronic SHOB was unchanged. BP elevated in office, but was overall well controlled.   I last saw patient on October 10, 2022.  She was doing well at that time.   Today she presents for 17-month follow-up.  She continues to do well.  Denies any acute cardiac complaints or conditions.  Tolerating her medications well. Denies any chest pain, shortness of breath, palpitations, syncope, presyncope, dizziness, orthopnea, PND, swelling or significant weight changes, acute bleeding, or claudication.  Studies Reviewed: SABRA    EKG: EKG Interpretation Date/Time:  Tuesday April 18 2023 15:11:32 EST Ventricular Rate:  76 PR Interval:  162 QRS Duration:  66 QT Interval:  394 QTC Calculation: 443 R Axis:   -60  Text Interpretation: Normal sinus rhythm Left axis deviation When compared with ECG of 02-Mar-2017 12:43, PREVIOUS ECG IS PRESENT Confirmed by Miriam Norris [phone]) on 04/18/2023 3:24:52 PM   Carotid duplex 12/2022: Summary:  Right Carotid: Velocities in the right ICA are consistent with a 1-39%  stenosis.   Left Carotid: Velocities in the left ICA are consistent with a 1-39%  stenosis.   Vertebrals: Bilateral vertebral arteries demonstrate antegrade flow.  Subclavians: Normal flow hemodynamics were seen in bilateral subclavian arteries.  Carotid duplex 12/2021:  Summary:  Right Carotid: Velocities in the right ICA are consistent with a 1-39%  stenosis.   Left Carotid: Velocities in the  left ICA are consistent with a 1-39%  stenosis.   Vertebrals: Bilateral vertebral arteries demonstrate antegrade flow.  Subclavians: Normal flow hemodynamics were seen in bilateral subclavian               arteries.   *See table(s) above for measurements and observations.    Vascular ultrasound LE venous reflex right 11/2021:  Summary:  Right:  - No evidence of deep vein thrombosis seen in the right lower extremity,  from the common femoral through the popliteal veins.  - No evidence of superficial venous thrombosis in the right lower  extremity.    - Venous reflux is noted in the right common femoral vein.  - Venous reflux is noted in the right greater saphenous vein in the calf.    - Right popliteal fossa complex cystic structure likely represents a  Baker's cyst.  *See table(s) above for measurements and observations.   Echo 08/2019:   1. Left ventricular ejection fraction, by estimation, is 60 to 65%. The  left ventricle has normal function. Left ventricular endocardial border  not optimally defined to evaluate regional wall motion. There is mild left  ventricular hypertrophy of the  posterior segment. Left ventricular diastolic parameters are consistent  with Grade I diastolic dysfunction (impaired relaxation).   2. Right ventricular systolic function is normal. The right ventricular  size is normal. There is normal pulmonary artery systolic pressure.   3. The mitral valve is degenerative. Trivial mitral valve regurgitation.   4. The aortic valve is tricuspid. Aortic valve regurgitation is not  visualized. No aortic stenosis  is present.   Comparison(s): Echocardiogram done 07/16/15 showed an EF of 65%.  Myoview  10/2015:  There was no ST segment deviation noted during stress. The study is normal. There are no perfusion defects consistent with prior infarct or current ischemia. This is a low risk study. The left ventricular ejection fraction is normal (55-65%).  Physical  Exam:   VS:  BP 118/70   Pulse 76   Ht 5' (1.524 m)   Wt 152 lb (68.9 kg)   SpO2 98%   BMI 29.69 kg/m    Wt Readings from Last 3 Encounters:  04/18/23 152 lb (68.9 kg)  01/30/23 153 lb 12.8 oz (69.8 kg)  12/27/22 152 lb (68.9 kg)    GEN: Well nourished, well developed in no acute distress, tremors noted on exam NECK: No JVD; No carotid bruits CARDIAC: S1/S2, RRR, no murmurs, rubs, gallops RESPIRATORY:  Clear to auscultation without rales, wheezing or rhonchi  ABDOMEN: Soft, non-tender, non-distended EXTREMITIES:  No edema; No deformity   ASSESSMENT AND PLAN: .    1. HTN BP stable. Discussed to monitor BP at home at least 2 hours after medications and sitting for 5-10 minutes. No medication changes at this time. Heart healthy diet and regular cardiovascular exercise encouraged.   2. HLD Last LDL 12/2022 was 80, managed by PCP. Continue rosuvastatin . Heart healthy diet and regular cardiovascular exercise encouraged.   3. 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 Embolic source was unclear. Continue to follow-up at [facility]. Last carotid dopplers revealed bilateral ICA stenosis at 1-39%. Denies any symptoms. Continue current medication regimen. Continue to follow-up with [facility].    Dispo: Follow-up with Dr. Dorn Ross or [facility] in 6 months or sooner if anything changes.   Signed, Almarie Crate, NP

## 2023-04-18 NOTE — Patient Instructions (Addendum)

## 2023-05-18 DIAGNOSIS — L603 Nail dystrophy: Secondary | ICD-10-CM | POA: Diagnosis not present

## 2023-05-18 DIAGNOSIS — E1142 Type 2 diabetes mellitus with diabetic polyneuropathy: Secondary | ICD-10-CM | POA: Diagnosis not present

## 2023-05-18 DIAGNOSIS — L84 Corns and callosities: Secondary | ICD-10-CM | POA: Diagnosis not present

## 2023-05-22 ENCOUNTER — Ambulatory Visit: Payer: Medicare Other | Attending: Cardiology | Admitting: *Deleted

## 2023-05-22 DIAGNOSIS — Z5181 Encounter for therapeutic drug level monitoring: Secondary | ICD-10-CM

## 2023-05-22 DIAGNOSIS — I82621 Acute embolism and thrombosis of deep veins of right upper extremity: Secondary | ICD-10-CM

## 2023-05-22 LAB — POCT INR: INR: 2.3 (ref 2.0–3.0)

## 2023-05-22 NOTE — Patient Instructions (Signed)
 Continue warfarin 1/2 tablet daily except 1 tablet on Mondays and Fridays.   ?Recheck INR 6 weeks  ?

## 2023-06-06 ENCOUNTER — Encounter: Payer: Self-pay | Admitting: Family Medicine

## 2023-06-06 NOTE — Telephone Encounter (Signed)
 Nurses-please work with referral to [facility] ENT thank you

## 2023-06-07 ENCOUNTER — Other Ambulatory Visit: Payer: Self-pay

## 2023-06-07 DIAGNOSIS — R42 Dizziness and giddiness: Secondary | ICD-10-CM

## 2023-06-07 NOTE — Telephone Encounter (Signed)
 Someone from our office will get in contact with you and leave a voicemail if no answer

## 2023-06-11 ENCOUNTER — Other Ambulatory Visit: Payer: Self-pay

## 2023-06-11 ENCOUNTER — Emergency Department (HOSPITAL_COMMUNITY)
Admission: EM | Admit: 2023-06-11 | Discharge: 2023-06-11 | Disposition: A | Discharge status: Home / Self Care | Attending: Emergency Medicine | Admitting: Emergency Medicine

## 2023-06-11 ENCOUNTER — Encounter (HOSPITAL_COMMUNITY): Payer: Self-pay

## 2023-06-11 DIAGNOSIS — Z7901 Long term (current) use of anticoagulants: Secondary | ICD-10-CM | POA: Diagnosis not present

## 2023-06-11 DIAGNOSIS — R040 Epistaxis: Secondary | ICD-10-CM | POA: Diagnosis not present

## 2023-06-11 MED ORDER — OXYMETAZOLINE HCL 0.05 % NA SOLN
1.0000 | Freq: Once | NASAL | Status: AC
  Administered 2023-06-11: 1 via NASAL
  Filled 2023-06-11: qty 30

## 2023-06-11 NOTE — ED Triage Notes (Signed)
 Pt had a nose bleed this evening from the right nare and got nervous because she is on blood thinners.  Bleeding has stopped in triage.

## 2023-06-11 NOTE — ED Provider Notes (Signed)
 [place] EMERGENCY DEPARTMENT AT [facility] Provider Note   CSN: [id number] Arrival date & time: 06/11/23  2133     History Chief Complaint  Patient presents with   Epistaxis    Jacqueline Morgan is a 83 y.o. female.  Patient presents to the emergency department concerns of a nosebleed.  She reportedly takes warfarin.  She states that she was nervous about the nosebleed as she has on warfarin.  Denies any significant digital trauma.  States that she was blowing her nose when the bleeding started.  No recent similar episodes.  Denies any feelings of dizziness, lightheadedness, weakness.   Epistaxis      Home Medications Prior to Admission medications   Medication Sig Start Date End Date Taking? Authorizing Provider  acetaminophen (TYLENOL) 500 MG tablet Take 1,000 mg by mouth every 8 (eight) hours as needed for moderate pain (pain score 4-6). For headache/pain    [provider]  albuterol (VENTOLIN HFA) 108 (90 Base) MCG/ACT inhaler Inhale 1-2 puffs into the lungs every 6 (six) hours as needed for wheezing or shortness of breath. 04/03/23   Barbette Merino, NP  blood glucose meter kit and supplies Dispense based on patient and insurance preference. Use to check sugars once per day. (FOR ICD-10 E10.9, E11.9). 02/05/21   Babs Sciara, MD  fluticasone (FLONASE) 50 MCG/ACT nasal spray Place 2 sprays into both nostrils daily. 12/21/21   Babs Sciara, MD  glucose blood (ONETOUCH VERIO) test strip USE TO CHECK SUGAR ONCE DAILY DX CODE R73.03, E11.69, E78.5 05/18/21   Babs Sciara, MD  Lancets [facility] DELICA PLUS [place]) MISC USE TO CHECK SUGAR ONCE DAILY DX CODE R73.03, E11.69, E78.5 05/18/21   Babs Sciara, MD  LORazepam (ATIVAN) 1 MG tablet TAKE (1/2) TABLET DURING THE DAY AND (1) TABLET AT BEDTIME AS NEEDED. 01/30/23   Babs Sciara, MD  metFORMIN (GLUCOPHAGE-XR) 500 MG 24 hr tablet TAKE 1 TABLET BY MOUTH DAILY  WITH BREAKFAST 12/07/22   Babs Sciara,  MD  metoprolol succinate (TOPROL-XL) 25 MG 24 hr tablet TAKE 1 TABLET BY MOUTH ONCE  DAILY 10/11/22   Babs Sciara, MD  pantoprazole (PROTONIX) 40 MG tablet TAKE 1 TABLET BY MOUTH TWICE  DAILY 10/11/22   Babs Sciara, MD  PARoxetine (PAXIL) 20 MG tablet TAKE 1 TABLET BY MOUTH IN THE  MORNING AND 2 TABLETS BY MOUTH  IN THE EVENING 10/11/22   Babs Sciara, MD  rosuvastatin (CRESTOR) 10 MG tablet TAKE 1 TABLET BY MOUTH DAILY 10/11/22   Babs Sciara, MD  sucralfate (CARAFATE) 1 g tablet TAKE 1 TABLET BY MOUTH BEFORE MEALS AND AT BEDTIME. 06/30/22   Babs Sciara, MD  warfarin (COUMADIN) 5 MG tablet TAKE 1/2 -1 TABLET BY MOUTH DAILY AS DIRECTED. 03/13/23   Antoine Poche, MD      Allergies    Codeine and Statins    Review of Systems   Review of Systems  HENT:  Positive for nosebleeds.   All other systems reviewed and are negative.   Physical Exam Updated Vital Signs BP (!) 137/93   Pulse 86   Temp 97.8 F (36.6 C) (Oral)   Resp 20   Wt 68.9 kg   SpO2 92%   BMI 29.69 kg/m  Physical Exam Vitals and nursing note reviewed.  Constitutional:      General: She is not in acute distress.    Appearance: She is well-developed.  HENT:     Head: Normocephalic and atraumatic.     Nose: Nose normal.     Comments: Small clot present in the right nostril along the septum.  No active signs of bleeding.  No septal hematoma. Eyes:     Conjunctiva/sclera: Conjunctivae normal.  Cardiovascular:     Rate and Rhythm: Normal rate and regular rhythm.     Heart sounds: No murmur heard. Pulmonary:     Effort: Pulmonary effort is normal. No respiratory distress.     Breath sounds: Normal breath sounds.  Abdominal:     Palpations: Abdomen is soft.     Tenderness: There is no abdominal tenderness.  Musculoskeletal:        General: No swelling.     Cervical back: Neck supple.  Skin:    General: Skin is warm and dry.     Capillary Refill: Capillary refill takes less than 2 seconds.   Neurological:     Mental Status: She is alert.  Psychiatric:        Mood and Affect: Mood normal.     ED Results / Procedures / Treatments   Labs (all labs ordered are listed, but only abnormal results are displayed) Labs Reviewed - No data to display  EKG None  Radiology No results found.  Procedures Procedures    Medications Ordered in ED Medications  oxymetazoline (AFRIN) 0.05 % nasal spray 1 spray (1 spray Each Nare Given 06/11/23 2235)    ED Course/ Medical Decision Making/ A&P                                 Medical Decision Making Risk OTC drugs.   This patient presents to the ED for concern of nosebleed.  Differential diagnosis includes epistaxis, septal hematoma, nasal trauma    Medicines ordered and prescription drug management:  I ordered medication including Afrin for epistaxis Reevaluation of the patient after these medicines showed that the patient improved I have reviewed the patients home medicines and have made adjustments as needed   Problem List / ED Course:  Patient presents to the emergency department concerns of a nosebleed.  States this happened prior to arriving and lasted for around 5 to 10 minutes.  No improvement in symptoms until she was in the emergency room triage space.  Nosebleed stopped at that time.  She denies any feelings of dizziness, lightheadedness, or weakness.  Patient is on warfarin.  She was concerned that she is on a blood thinner at the bleeding was not stopping. Physical exam large unremarkable.  No evident signs of active bleeding at this time.  A small clot is still present at the left nostril.  Denies any feelings that blood is trickling to the back of her throat.  Vitals unremarkable.  Given reassuring findings, do not feel the patient would necessitate nasal packing at this time.  Will provide patient Afrin to be discharged and use at home as needed if she has recurrent episodes of nosebleeds.  Advise return  precautions such as persistent or on slowed bleeding after direct pressure use of Afrin for 15 to 30 minutes.  Encourage patient to continue using warfarin.  Patient otherwise stable plans for outpatient follow-up.  ENT contact formation provided for possible evaluation for cautery if symptoms persist.  Final Clinical Impression(s) / ED Diagnoses Final diagnoses:  Epistaxis    Rx / DC Orders ED Discharge Orders  None         Salomon Mast 06/11/23 2322    Eber Hong, MD 06/13/23 [phone]

## 2023-06-11 NOTE — ED Notes (Signed)
 ED Provider at bedside.

## 2023-06-11 NOTE — Discharge Instructions (Signed)
 You are seen in the emergency department today for concerns of a nosebleed.  Your bleeding stopped on its own.  I have given you a bottle of Afrin to use at home if you were to have repeat episodes of nosebleeds.  Please try to use this at home for during the emergency department as this may stop your bleeding.  For any concerns of persistent bleeding not responding to any interventions, return to the emergency department.  I did provide you with contact formation for our ENT office in [place].  You may reach out to their office and try to be seen if you continue to have persistent nosebleeds as they may recommend interventions.

## 2023-06-27 ENCOUNTER — Encounter: Payer: Self-pay | Admitting: Family Medicine

## 2023-06-28 ENCOUNTER — Ambulatory Visit: Admitting: Family Medicine

## 2023-06-28 ENCOUNTER — Ambulatory Visit (INDEPENDENT_AMBULATORY_CARE_PROVIDER_SITE_OTHER): Admitting: Family Medicine

## 2023-06-28 VITALS — BP 145/80 | HR 75 | Temp 98.0°F | Ht 60.0 in | Wt 149.0 lb

## 2023-06-28 DIAGNOSIS — M542 Cervicalgia: Secondary | ICD-10-CM | POA: Diagnosis not present

## 2023-06-28 DIAGNOSIS — E1122 Type 2 diabetes mellitus with diabetic chronic kidney disease: Secondary | ICD-10-CM

## 2023-06-28 DIAGNOSIS — R040 Epistaxis: Secondary | ICD-10-CM

## 2023-06-28 DIAGNOSIS — E1169 Type 2 diabetes mellitus with other specified complication: Secondary | ICD-10-CM

## 2023-06-28 DIAGNOSIS — R42 Dizziness and giddiness: Secondary | ICD-10-CM | POA: Diagnosis not present

## 2023-06-28 DIAGNOSIS — Z79899 Other long term (current) drug therapy: Secondary | ICD-10-CM

## 2023-06-28 DIAGNOSIS — N1832 Chronic kidney disease, stage 3b: Secondary | ICD-10-CM

## 2023-06-28 DIAGNOSIS — E785 Hyperlipidemia, unspecified: Secondary | ICD-10-CM

## 2023-06-28 NOTE — Progress Notes (Signed)
   Subjective:    Patient ID: Jacqueline Morgan, female    DOB: 02-19-1941, 83 y.o.   MRN: [id number]  HPI swollen glands chin,neck and ears  She is concerned about swollen glands Complains tenderness around the neck Denies fever chills sweats Otherwise doing well Energy level doing okay Appetite doing okay No dysphagia She states she set herself up with a ENT appointment   Review of Systems     Objective:   Physical Exam General-in no acute distress Eyes-no discharge Lungs-respiratory rate normal, CTA CV-no murmurs,RRR Extremities skin warm dry no edema Neuro grossly normal Behavior normal, alert No masses are felt around the neck no nodularity.       Assessment & Plan:  1. Dizziness (Primary) This has been going on for quite some time I believe it is more neurologic.  No findings of any stroke related symptoms  2. 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 Epistaxis Patient states she did have a bloody nose but she got it.  I do not see any active bleeding currently - CBC  4. Type 2 diabetes mellitus with stage 3b chronic kidney disease, without long-term current use of insulin (HCC) She is due for labs healthy diet continue meds - Hemoglobin A1c - Basic metabolic panel  5. Hyperlipidemia associated with type 2 diabetes mellitus (HCC) Continue sinew healthy diet check labs, continue medication - Lipid panel  6. High risk medication use Check labs - Hepatic function panel  Keep all regular follow-up visits

## 2023-07-03 ENCOUNTER — Ambulatory Visit: Payer: Medicare Other | Attending: Cardiology | Admitting: *Deleted

## 2023-07-03 DIAGNOSIS — I82621 Acute embolism and thrombosis of deep veins of right upper extremity: Secondary | ICD-10-CM

## 2023-07-03 DIAGNOSIS — Z5181 Encounter for therapeutic drug level monitoring: Secondary | ICD-10-CM

## 2023-07-03 LAB — POCT INR: INR: 2.1 (ref 2.0–3.0)

## 2023-07-03 NOTE — Patient Instructions (Signed)
 Continue warfarin 1/2 tablet daily except 1 tablet on Mondays and Fridays.   ?Recheck INR 6 weeks  ?

## 2023-07-10 DIAGNOSIS — E1169 Type 2 diabetes mellitus with other specified complication: Secondary | ICD-10-CM | POA: Diagnosis not present

## 2023-07-10 DIAGNOSIS — Z79899 Other long term (current) drug therapy: Secondary | ICD-10-CM | POA: Diagnosis not present

## 2023-07-10 DIAGNOSIS — R040 Epistaxis: Secondary | ICD-10-CM | POA: Diagnosis not present

## 2023-07-10 DIAGNOSIS — E785 Hyperlipidemia, unspecified: Secondary | ICD-10-CM | POA: Diagnosis not present

## 2023-07-10 DIAGNOSIS — N1832 Chronic kidney disease, stage 3b: Secondary | ICD-10-CM | POA: Diagnosis not present

## 2023-07-11 ENCOUNTER — Encounter: Payer: Self-pay | Admitting: Family Medicine

## 2023-07-11 LAB — BASIC METABOLIC PANEL WITH GFR
BUN/Creatinine Ratio: 13 (ref 12–28)
BUN: 15 mg/dL (ref 8–27)
CO2: 24 mmol/L (ref 20–29)
Calcium: 9.2 mg/dL (ref 8.7–10.3)
Chloride: 103 mmol/L (ref 96–106)
Creatinine, Ser: 1.14 mg/dL — ABNORMAL HIGH (ref 0.57–1.00)
Glucose: 106 mg/dL — ABNORMAL HIGH (ref 70–99)
Potassium: 5.4 mmol/L — ABNORMAL HIGH (ref 3.5–5.2)
Sodium: 138 mmol/L (ref 134–144)
eGFR: 48 mL/min/{1.73_m2} — ABNORMAL LOW (ref >59)

## 2023-07-11 LAB — CBC
Hematocrit: 37.9 % (ref 34.0–46.6)
Hemoglobin: 12.2 g/dL (ref 11.1–15.9)
MCH: 28.4 pg (ref 26.6–33.0)
MCHC: 32.2 g/dL (ref 31.5–35.7)
MCV: 88 fL (ref 79–97)
Platelets: 335 10*3/uL (ref 150–450)
RBC: 4.29 x10E6/uL (ref 3.77–5.28)
RDW: 13.7 % (ref 11.7–15.4)
WBC: 7.7 10*3/uL (ref 3.4–10.8)

## 2023-07-11 LAB — LIPID PANEL
Chol/HDL Ratio: 3 ratio (ref 0.0–4.4)
Cholesterol, Total: 142 mg/dL (ref 100–199)
HDL: 48 mg/dL (ref >39)
LDL Chol Calc (NIH): 66 mg/dL (ref 0–99)
Triglycerides: 168 mg/dL — ABNORMAL HIGH (ref 0–149)
VLDL Cholesterol Cal: 28 mg/dL (ref 5–40)

## 2023-07-11 LAB — HEPATIC FUNCTION PANEL
ALT: 12 IU/L (ref 0–32)
AST: 19 IU/L (ref 0–40)
Albumin: 3.8 g/dL (ref 3.7–4.7)
Alkaline Phosphatase: 96 IU/L (ref 44–121)
Bilirubin Total: 0.3 mg/dL (ref 0.0–1.2)
Bilirubin, Direct: 0.12 mg/dL (ref 0.00–0.40)
Total Protein: 6.6 g/dL (ref 6.0–8.5)

## 2023-07-11 LAB — C-REACTIVE PROTEIN: CRP: 1 mg/L (ref 0–10)

## 2023-07-11 LAB — HEMOGLOBIN A1C
Est. average glucose Bld gHb Est-mCnc: 137 mg/dL
Hgb A1c MFr Bld: 6.4 % — ABNORMAL HIGH (ref 4.8–5.6)

## 2023-07-16 ENCOUNTER — Emergency Department (HOSPITAL_COMMUNITY)
Admission: EM | Admit: 2023-07-16 | Discharge: 2023-07-16 | Disposition: A | Discharge status: Home / Self Care | Attending: Emergency Medicine | Admitting: Emergency Medicine

## 2023-07-16 ENCOUNTER — Other Ambulatory Visit: Payer: Self-pay

## 2023-07-16 ENCOUNTER — Encounter (HOSPITAL_COMMUNITY): Payer: Self-pay | Admitting: Emergency Medicine

## 2023-07-16 DIAGNOSIS — R040 Epistaxis: Secondary | ICD-10-CM | POA: Insufficient documentation

## 2023-07-16 DIAGNOSIS — N1832 Chronic kidney disease, stage 3b: Secondary | ICD-10-CM | POA: Diagnosis not present

## 2023-07-16 DIAGNOSIS — I129 Hypertensive chronic kidney disease with stage 1 through stage 4 chronic kidney disease, or unspecified chronic kidney disease: Secondary | ICD-10-CM | POA: Diagnosis not present

## 2023-07-16 DIAGNOSIS — Z7984 Long term (current) use of oral hypoglycemic drugs: Secondary | ICD-10-CM | POA: Insufficient documentation

## 2023-07-16 DIAGNOSIS — Z86718 Personal history of other venous thrombosis and embolism: Secondary | ICD-10-CM | POA: Insufficient documentation

## 2023-07-16 DIAGNOSIS — Z7901 Long term (current) use of anticoagulants: Secondary | ICD-10-CM | POA: Diagnosis not present

## 2023-07-16 DIAGNOSIS — E1122 Type 2 diabetes mellitus with diabetic chronic kidney disease: Secondary | ICD-10-CM | POA: Diagnosis not present

## 2023-07-16 DIAGNOSIS — Z79899 Other long term (current) drug therapy: Secondary | ICD-10-CM | POA: Insufficient documentation

## 2023-07-16 LAB — CBC WITH DIFFERENTIAL/PLATELET
Abs Immature Granulocytes: 0.02 10*3/uL (ref 0.00–0.07)
Basophils Absolute: 0.1 10*3/uL (ref 0.0–0.1)
Basophils Relative: 1 %
Eosinophils Absolute: 0.3 10*3/uL (ref 0.0–0.5)
Eosinophils Relative: 3 %
HCT: 39.2 % (ref 36.0–46.0)
Hemoglobin: 12.3 g/dL (ref 12.0–15.0)
Immature Granulocytes: 0 %
Lymphocytes Relative: 22 %
Lymphs Abs: 2.2 10*3/uL (ref 0.7–4.0)
MCH: 28.3 pg (ref 26.0–34.0)
MCHC: 31.4 g/dL (ref 30.0–36.0)
MCV: 90.3 fL (ref 80.0–100.0)
Monocytes Absolute: 0.8 10*3/uL (ref 0.1–1.0)
Monocytes Relative: 8 %
Neutro Abs: 6.7 10*3/uL (ref 1.7–7.7)
Neutrophils Relative %: 66 %
Platelets: 343 10*3/uL (ref 150–400)
RBC: 4.34 MIL/uL (ref 3.87–5.11)
RDW: 15 % (ref 11.5–15.5)
WBC: 10.1 10*3/uL (ref 4.0–10.5)
nRBC: 0 % (ref 0.0–0.2)

## 2023-07-16 LAB — BASIC METABOLIC PANEL WITH GFR
Anion gap: 12 (ref 5–15)
BUN: 17 mg/dL (ref 8–23)
CO2: 25 mmol/L (ref 22–32)
Calcium: 9 mg/dL (ref 8.9–10.3)
Chloride: 100 mmol/L (ref 98–111)
Creatinine, Ser: 1.11 mg/dL — ABNORMAL HIGH (ref 0.44–1.00)
GFR, Estimated: 50 mL/min — ABNORMAL LOW (ref >60)
Glucose, Bld: 123 mg/dL — ABNORMAL HIGH (ref 70–99)
Potassium: 4.1 mmol/L (ref 3.5–5.1)
Sodium: 137 mmol/L (ref 135–145)

## 2023-07-16 LAB — PROTIME-INR
INR: 2.3 — ABNORMAL HIGH (ref 0.8–1.2)
Prothrombin Time: 25.9 s — ABNORMAL HIGH (ref 11.4–15.2)

## 2023-07-16 MED ORDER — OXYCODONE-ACETAMINOPHEN 5-325 MG PO TABS
1.0000 | ORAL_TABLET | Freq: Once | ORAL | Status: AC
  Administered 2023-07-16: 1 via ORAL
  Filled 2023-07-16: qty 1

## 2023-07-16 MED ORDER — OXYCODONE HCL 5 MG PO TABS: 5.0000 mg | ORAL_TABLET | ORAL | 0 refills | Status: DC | PRN

## 2023-07-16 MED ORDER — OXYMETAZOLINE HCL 0.05 % NA SOLN
1.0000 | Freq: Once | NASAL | Status: AC
  Administered 2023-07-16: 1 via NASAL
  Filled 2023-07-16: qty 30

## 2023-07-16 MED ORDER — CEPHALEXIN 500 MG PO CAPS: 1000.0000 mg | ORAL_CAPSULE | Freq: Two times a day (BID) | ORAL | 0 refills | Status: AC

## 2023-07-16 NOTE — Discharge Instructions (Addendum)
 We evaluated you for your nosebleed.  Your red blood cell count was normal and your INR was in goal.  We did have to pack your nose to stop the bleeding.  Please follow-up with your primary doctor, go to urgent care, or return to the emergency department in 2 to 3 days to have this removed.  If you really cannot tolerate it, you can also come back tomorrow.  We have prescribed you a few days of antibiotics to cover for any infection associated with the packing.  Once the packing is removed, you do not need to take these antibiotics.  We have also prescribed a small amount of oxycodone for pain.  Please only take this as necessary.  Please try to take at 1000 mg of Tylenol every 6 hours first for pain.  If this does not help, you can take oxycodone.  This can make you drowsy and at risk of falls to be careful in taking it.  Do not mix with alcohol or drive while taking oxycodone.  Please return if you develop any new symptoms such as recurrent bleeding, severe pain, fevers, or any other new symptoms.

## 2023-07-16 NOTE — ED Provider Notes (Signed)
 [place] EMERGENCY DEPARTMENT AT [facility] Provider Note  CSN: [id number] Arrival date & time: 07/16/23 3086  Chief Complaint(s) Epistaxis  HPI Jacqueline Morgan is a 83 y.o. 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  Began this morning.  Coming out of the right nostril.  Tried Afrin at home without improvement.  Denies any medication changes.  No syncope.  No nasal trauma.   Past Medical History Past Medical History:  Diagnosis Date   Anxiety    Complication of anesthesia    passed out after dental surgery   Depression    Fibromyalgia    GERD (gastroesophageal reflux disease)    Hyperlipidemia    Hypertension    Palpitations    Tremor, essential 11/30/2015   Patient Active Problem List   Diagnosis Date Noted   Diabetes mellitus without complication (HCC) 03/01/2022   Type 2 diabetes mellitus with stage 3b chronic kidney disease, without long-term current use of insulin (HCC) 03/01/2022   Hyperlipidemia associated with type 2 diabetes mellitus (HCC) 06/03/2020   History of colonic polyps 06/07/2018   Gastroesophageal reflux disease without esophagitis 06/07/2018   Osteoporosis 01/26/2018   Prediabetes 10/25/2017   GAD (generalized anxiety disorder) 01/27/2017   Subjective visual disturbance 11/30/2015   Tremor, essential 11/30/2015   GERD (gastroesophageal reflux disease) 06/24/2014   Multiple thyroid nodules 06/24/2014   Encounter for therapeutic drug monitoring 05/06/2013   PAD (peripheral artery disease) (HCC) 12/31/2012   DVT of upper extremity (deep vein thrombosis) (HCC) 12/24/2012   Long term (current) use of anticoagulants 12/24/2012   GASTROESOPHAGEAL REFLUX DISEASE 05/19/2009   FIBROMYALGIA 05/19/2009   SYNCOPE 05/19/2009   Hyperglycemia 05/19/2009   Panic attacks 05/07/2009   DEPRESSION 05/07/2009   Essential hypertension 05/07/2009   Home Medication(s) Prior to Admission  medications   Medication Sig Start Date End Date Taking? Authorizing Provider  cephALEXin (KEFLEX) 500 MG capsule Take 2 capsules (1,000 mg total) by mouth 2 (two) times daily for 3 days. 07/16/23 07/19/23 Yes Mordecai Applebaum, MD  oxyCODONE (ROXICODONE) 5 MG immediate release tablet Take 1 tablet (5 mg total) by mouth every 4 (four) hours as needed for severe pain (pain score 7-10). 07/16/23  Yes Mordecai Applebaum, MD  acetaminophen (TYLENOL) 500 MG tablet Take 1,000 mg by mouth every 8 (eight) hours as needed for moderate pain (pain score 4-6). For headache/pain    [provider]  albuterol (VENTOLIN HFA) 108 (90 Base) MCG/ACT inhaler Inhale 1-2 puffs into the lungs every 6 (six) hours as needed for wheezing or shortness of breath. 04/03/23   Gregoria Leas, NP  blood glucose meter kit and supplies Dispense based on patient and insurance preference. Use to check sugars once per day. (FOR ICD-10 E10.9, E11.9). 02/05/21   Bennet Brasil, MD  fluticasone (FLONASE) 50 MCG/ACT nasal spray Place 2 sprays into both nostrils daily. 12/21/21   Bennet Brasil, MD  glucose blood (ONETOUCH VERIO) test strip USE TO CHECK SUGAR ONCE DAILY DX CODE R73.03, E11.69, E78.5 05/18/21   Bennet Brasil, MD  Lancets [facility] DELICA PLUS [place]) MISC USE TO CHECK SUGAR ONCE DAILY DX CODE R73.03, E11.69, E78.5 05/18/21   Bennet Brasil, MD  LORazepam (ATIVAN) 1 MG tablet TAKE (1/2) TABLET DURING THE DAY AND (1) TABLET AT BEDTIME AS NEEDED. 01/30/23   Bennet Brasil, MD  metFORMIN (GLUCOPHAGE-XR) 500 MG 24 hr tablet TAKE 1 TABLET BY MOUTH DAILY  WITH  BREAKFAST 12/07/22   Bennet Brasil, MD  metoprolol succinate (TOPROL-XL) 25 MG 24 hr tablet TAKE 1 TABLET BY MOUTH ONCE  DAILY 10/11/22   Bennet Brasil, MD  pantoprazole (PROTONIX) 40 MG tablet TAKE 1 TABLET BY MOUTH TWICE  DAILY 10/11/22   Bennet Brasil, MD  PARoxetine (PAXIL) 20 MG tablet TAKE 1 TABLET BY MOUTH IN THE  MORNING AND 2 TABLETS BY MOUTH  IN THE  EVENING 10/11/22   Luking, Scott A, MD  rosuvastatin (CRESTOR) 10 MG tablet TAKE 1 TABLET BY MOUTH DAILY 10/11/22   Bennet Brasil, MD  sucralfate (CARAFATE) 1 g tablet TAKE 1 TABLET BY MOUTH BEFORE MEALS AND AT BEDTIME. 06/30/22   Bennet Brasil, MD  warfarin (COUMADIN) 5 MG tablet TAKE 1/2 -1 TABLET BY MOUTH DAILY AS DIRECTED. 03/13/23   Laurann Pollock, MD                                                                                                                                    Past Surgical History Past Surgical History:  Procedure Laterality Date   COLONOSCOPY N/A 04/01/2015   Procedure: COLONOSCOPY;  Surgeon: Ruby Corporal, MD;  Location: [facility];  Service: Endoscopy;  Laterality: N/A;  1:45   COLONOSCOPY WITH PROPOFOL N/A 10/18/2019   Procedure: COLONOSCOPY WITH PROPOFOL;  Surgeon: Ruby Corporal, MD;  Location: [facility];  Service: Endoscopy;  Laterality: N/A;  730   EMBOLECTOMY Left 12/19/2012   Procedure: EMBOLECTOMY OF LEFT BRACHIAL ARTERY;  Surgeon: Arvil Lauber, MD;  Location: [facility] OR;  Service: Vascular;  Laterality: Left;   EYE SURGERY Bilateral    cataract   POLYPECTOMY  10/18/2019   Procedure: POLYPECTOMY;  Surgeon: Ruby Corporal, MD;  Location: [facility];  Service: Endoscopy;;   Family History Family History  Problem Relation Age of Onset   Cancer Mother        ovarian, colon   Heart disease Mother    Heart attack Father    Glaucoma Father    COPD Sister    COPD Brother    Benign prostatic hyperplasia Brother     Social History Social History   Tobacco Use   Smoking status: Never    Passive exposure: Never   Smokeless tobacco: Never  Vaping Use   Vaping status: Never Used  Substance Use Topics   Alcohol use: No   Drug use: No   Allergies Codeine and Statins  Review of Systems Review of Systems  All other systems reviewed and are negative.   Physical Exam Vital Signs  I have reviewed the triage vital signs BP (!) 167/93  (BP Location: Left Arm)   Pulse 88   Temp 98 F (36.7 C) (Axillary)   Resp 20   Ht 5' (1.524 m)   Wt 67.6 kg   SpO2 92%   BMI 29.10 kg/m  Physical Exam Vitals and nursing note reviewed.  Constitutional:      Appearance: Normal appearance.  HENT:     Head: Normocephalic and atraumatic.     Nose:     Comments: Epistaxis from right nare    Mouth/Throat:     Mouth: Mucous membranes are moist.  Eyes:     Conjunctiva/sclera: Conjunctivae normal.  Cardiovascular:     Rate and Rhythm: Normal rate.  Pulmonary:     Effort: Pulmonary effort is normal. No respiratory distress.  Abdominal:     General: Abdomen is flat.  Musculoskeletal:        General: No deformity.  Skin:    General: Skin is warm and dry.     Capillary Refill: Capillary refill takes less than 2 seconds.  Neurological:     General: No focal deficit present.     Mental Status: She is alert. Mental status is at baseline.  Psychiatric:        Mood and Affect: Mood normal.        Behavior: Behavior normal.     ED Results and Treatments Labs (all labs ordered are listed, but only abnormal results are displayed) Labs Reviewed  BASIC METABOLIC PANEL WITH GFR - Abnormal; Notable for the following components:      Result Value   Glucose, Bld 123 (*)    Creatinine, Ser 1.11 (*)    GFR, Estimated 50 (*)    All other components within normal limits  PROTIME-INR - Abnormal; Notable for the following components:   Prothrombin Time 25.9 (*)    INR 2.3 (*)    All other components within normal limits  CBC WITH DIFFERENTIAL/PLATELET                                                                                                                          Radiology No results found.  Pertinent labs & imaging results that were available during my care of the patient were reviewed by me and considered in my medical decision making (see MDM for details).  Medications Ordered in ED Medications  oxymetazoline (AFRIN) 0.05  % nasal spray 1 spray (1 spray Each Nare Given 07/16/23 0913)  oxyCODONE-acetaminophen (PERCOCET/ROXICET) 5-325 MG per tablet 1 tablet (1 tablet Oral Given 07/16/23 0959)  Procedures Epistaxis Management  Date/Time: 07/16/2023 11:21 AM  Performed by: Lonell Grandchild, MD Authorized by: Lonell Grandchild, MD   Consent:    Consent obtained:  Verbal   Consent given by:  Patient   Risks, benefits, and alternatives were discussed: yes     Risks discussed:  Bleeding, infection, pain and nasal injury   Alternatives discussed:  No treatment and observation Universal protocol:    Patient identity confirmed:  Verbally with patient and arm band Anesthesia:    Anesthesia method:  None Procedure details:    Treatment site:  R anterior   Treatment method:  Anterior pack   Treatment complexity:  Limited   Treatment episode: initial   Post-procedure details:    Assessment:  Bleeding stopped   Procedure completion:  Tolerated well, no immediate complications   (including critical care time)  Medical Decision Making / ED Course   MDM:  83 year old presenting with epistaxis.  Patient tried Afrin at home, manual pressure tried in the emergency department, but symptoms persistent.  Will pack the nose.  Given bleeding on Coumadin, check INR which is therapeutic.  Will reassess.  Clinical Course as of 07/16/23 1123  Sun Jul 16, 2023  1120 Nosebleed stopped after packing. Will prescribe some pain medication for discomfort and prophylactic abx. Advised return for removal in 2--3 days. Will discharge patient to home. All questions answered. Patient comfortable with plan of discharge. Return precautions discussed with patient and specified on the after visit summary.  [WS]    Clinical Course User Index [WS] Suezanne Jacquet, Jerilee Field, MD     Additional history  obtained: -Additional history obtained from family -External records from outside source obtained and reviewed including: Chart review including previous notes, labs, imaging, consultation notes including prior notes    Lab Tests: -I ordered, reviewed, and interpreted labs.   The pertinent results include:   Labs Reviewed  BASIC METABOLIC PANEL WITH GFR - Abnormal; Notable for the following components:      Result Value   Glucose, Bld 123 (*)    Creatinine, Ser 1.11 (*)    GFR, Estimated 50 (*)    All other components within normal limits  PROTIME-INR - Abnormal; Notable for the following components:   Prothrombin Time 25.9 (*)    INR 2.3 (*)    All other components within normal limits  CBC WITH DIFFERENTIAL/PLATELET    Notable for therapeutic INR    Medicines ordered and prescription drug management: Meds ordered this encounter  Medications   oxymetazoline (AFRIN) 0.05 % nasal spray 1 spray   oxyCODONE-acetaminophen (PERCOCET/ROXICET) 5-325 MG per tablet 1 tablet    Refill:  0   cephALEXin (KEFLEX) 500 MG capsule    Sig: Take 2 capsules (1,000 mg total) by mouth 2 (two) times daily for 3 days.    Dispense:  12 capsule    Refill:  0   oxyCODONE (ROXICODONE) 5 MG immediate release tablet    Sig: Take 1 tablet (5 mg total) by mouth every 4 (four) hours as needed for severe pain (pain score 7-10).    Dispense:  8 tablet    Refill:  0    -I have reviewed the patients home medicines and have made adjustments as needed  Social Determinants of Health:  Diagnosis or treatment significantly limited by social determinants of health: obesity   Reevaluation: After the interventions noted above, I reevaluated the patient and found that their symptoms have resolved  Co morbidities that complicate the  patient evaluation  Past Medical History:  Diagnosis Date   Anxiety    Complication of anesthesia    passed out after dental surgery   Depression    Fibromyalgia    GERD  (gastroesophageal reflux disease)    Hyperlipidemia    Hypertension    Palpitations    Tremor, essential 11/30/2015      Dispostion: Disposition decision including need for hospitalization was considered, and patient discharged from emergency department.    Final Clinical Impression(s) / ED Diagnoses Final diagnoses:  Epistaxis     This chart was dictated using voice recognition software.  Despite best efforts to proofread,  errors can occur which can change the documentation meaning.    Mordecai Applebaum, MD 07/16/23 [phone]

## 2023-07-16 NOTE — ED Triage Notes (Signed)
 Pt reports to the ED with daughter via pov with c/o multiple nose bleeds. Pt reports she is on blood thinners since a blood clot in her left arm in 2014. Daughter reports this is the 2nd nose bleed this week. Pt is waiting for an appointment at the ENT for dizziness and a "stopped up feeling" in her ears. Pt states she feels a little dizzy all the time. Pt is a&ox4

## 2023-07-17 ENCOUNTER — Encounter: Payer: Self-pay | Admitting: Family Medicine

## 2023-07-18 ENCOUNTER — Telehealth: Payer: Self-pay | Admitting: Cardiology

## 2023-07-18 NOTE — Telephone Encounter (Signed)
 New Message:    Daughter says patient had a Nose Bleed on Sunday(07-16-23). She had to go to the ER.She have not taken any Coumadin on Sunday or Monday. Daughter wants to be advised on what patient needs to do about her Coumadin?Jacqueline Morgan

## 2023-07-18 NOTE — Telephone Encounter (Signed)
 Called and spoke with Sherrlyn Dolores.  Discussed ED visit on 4/13 due to nose bleed.  INR at ED visit was 2.3.  Coumadin was held Sunday and Monday.  Told daughter to have pt restart warfarin at current dose. She has appt with ENT on Thursday to remove rhino rocket. She has INR on 5/12.  Told her to call if she needs to be seen sooner.  Daughter verbalized understanding.

## 2023-07-20 ENCOUNTER — Encounter (INDEPENDENT_AMBULATORY_CARE_PROVIDER_SITE_OTHER): Payer: Self-pay | Admitting: Otolaryngology

## 2023-07-20 ENCOUNTER — Ambulatory Visit (INDEPENDENT_AMBULATORY_CARE_PROVIDER_SITE_OTHER): Admitting: Otolaryngology

## 2023-07-20 ENCOUNTER — Telehealth (INDEPENDENT_AMBULATORY_CARE_PROVIDER_SITE_OTHER): Payer: Self-pay

## 2023-07-20 VITALS — BP 104/61 | HR 77

## 2023-07-20 DIAGNOSIS — R040 Epistaxis: Secondary | ICD-10-CM

## 2023-07-20 DIAGNOSIS — Z7901 Long term (current) use of anticoagulants: Secondary | ICD-10-CM | POA: Diagnosis not present

## 2023-07-20 MED ORDER — SALINE SPRAY 0.65 % NA SOLN: 1.0000 | NASAL | 5 refills | Status: AC | PRN

## 2023-07-20 NOTE — Patient Instructions (Signed)
 Epistaxis prevention instructions given to the patient: - use nasal saline spray x6/day and Vaseline twice a day to prevent nose bleeds - for active nose bleeds use Afrin and nasal tip pressure x 10 min to stop them - if nose bleed does not stop with above measures, please go to Emergency Room  - please see your primary care provider to check your blood pressure and make sure it is under control - return for recurrent nose bleeds and we will consider cautery of your nasal blood vessels  - Purchase BleedStop to have at home and help with epistaxis control  - gargle with cold water when you have a nose bleed to cool the palate and nasal passages and help stop nose bleeds.

## 2023-07-20 NOTE — Progress Notes (Signed)
 ENT CONSULT:  Reason for Consult: recurrent epistaxis    HPI: Discussed the use of AI scribe software for clinical note transcription with the patient, who gave verbal consent to proceed.  History of Present Illness Jacqueline Morgan is an 83 year old female on warfarin who presents with recurrent epistaxis right side, requiring nasal packing in ED 4 days ago.  She has a history of recurrent epistaxis, primarily on the right side, and is currently on warfarin. She experienced a significant episode in March that resolved without intervention and another 4 days ago that required nasal packing at the ER. She has had several episodes in between, some of which she managed to stop at home using Vaseline and Afrin, although these measures were not always effective.  Her blood pressure has been well-controlled recently. After the ER visit in April, she noticed some blood around the packing but did not experience active bleeding. No sensation of blood running down her throat after the ER visit.  She has never had nasal packing before the recent ER visit. During the ER visit, a smaller packing was initially used, but she bled around it, necessitating a larger packing. The packing has been in place for four days, causing significant discomfort and irritation.  She has been on warfarin for ten and a half years, and the epistaxis began in March, which was her first episode. She has been using Vaseline daily to manage the dryness in her nose.  Records Reviewed:  ED visit 07/16/23 Jacqueline Morgan is a 83 y.o. 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  Began this morning.  Coming out of the right nostril.  Tried Afrin at home without improvement.  Denies any medication changes.  No syncope.  No nasal trauma   Had Rhinorocket placed  ED visit 06/11/23  Jacqueline Morgan is a 83 y.o. female.  Patient presents to the emergency department concerns  of a nosebleed.  She reportedly takes warfarin.  She states that she was nervous about the nosebleed as she has on warfarin.  Denies any significant digital trauma.  States that she was blowing her nose when the bleeding started.  No recent similar episodes.  Denies any feelings of dizziness, lightheadedness, weakness.   Did not have packing    Past Medical History:  Diagnosis Date   Anxiety    Complication of anesthesia    passed out after dental surgery   Depression    Fibromyalgia    GERD (gastroesophageal reflux disease)    Hyperlipidemia    Hypertension    Palpitations    Tremor, essential 11/30/2015    Past Surgical History:  Procedure Laterality Date   COLONOSCOPY N/A 04/01/2015   Procedure: COLONOSCOPY;  Surgeon: Malissa Hippo, MD;  Location: [facility];  Service: Endoscopy;  Laterality: N/A;  1:45   COLONOSCOPY WITH PROPOFOL N/A 10/18/2019   Procedure: COLONOSCOPY WITH PROPOFOL;  Surgeon: Malissa Hippo, MD;  Location: [facility];  Service: Endoscopy;  Laterality: N/A;  730   EMBOLECTOMY Left 12/19/2012   Procedure: EMBOLECTOMY OF LEFT BRACHIAL ARTERY;  Surgeon: Fransisco Hertz, MD;  Location: [facility] OR;  Service: Vascular;  Laterality: Left;   EYE SURGERY Bilateral    cataract   POLYPECTOMY  10/18/2019   Procedure: POLYPECTOMY;  Surgeon: Malissa Hippo, MD;  Location: [facility];  Service: Endoscopy;;    Family History  Problem Relation Age of Onset   Cancer Mother  ovarian, colon   Heart disease Mother    Heart attack Father    Glaucoma Father    COPD Sister    COPD Brother    Benign prostatic hyperplasia Brother     Social History:  reports that she has never smoked. She has never been exposed to tobacco smoke. She has never used smokeless tobacco. She reports that she does not drink alcohol and does not use drugs.  Allergies:  Allergies  Allergen Reactions   Codeine     REACTION: Adverse gastrointestinal effects   Statins     Felt bad     Medications: I have reviewed the patient's current medications.   The PMH, PSH, Medications, Allergies, and SH were reviewed and updated.  ROS: Constitutional: Negative for fever, weight loss and weight gain. Cardiovascular: Negative for chest pain and dyspnea on exertion. Respiratory: Is not experiencing shortness of breath at rest. Gastrointestinal: Negative for nausea and vomiting. Neurological: Negative for headaches. Psychiatric: The patient is not nervous/anxious  Blood pressure 104/61, pulse 77, SpO2 (!) 88%.  PHYSICAL EXAM:  Exam: General: Well-developed, well-nourished Communication and Voice: Clear pitch and clarity Respiratory Respiratory effort: Equal inspiration and expiration without stridor Cardiovascular Peripheral Vascular: Warm extremities with equal color/perfusion Eyes: No nystagmus with equal extraocular motion bilaterally Neuro/Psych/Balance: Patient oriented to person, place, and time; Appropriate mood and affect; Gait is intact with no imbalance; Cranial nerves I-XII are intact Head and Face Inspection: Normocephalic and atraumatic without mass or lesion Palpation: Facial skeleton intact without bony stepoffs Salivary Glands: No mass or tenderness Facial Strength: Facial motility symmetric and full bilaterally ENT Pinna: External ear intact and fully developed External canal: Canal is patent with intact skin Tympanic Membrane: Clear and mobile External Nose: No scar or anatomic deformity Internal Nose: Septum is deviated to the right Rhinorocket removed from the right side, small amount of blood clot and evidence of abraded mucosa along the anterior septum and inferior turbinate on the right. No active bleeding. No polyp, or purulence. No mass. Lips, Teeth, and gums: Mucosa and teeth intact and viable TMJ: No pain to palpation with full mobility Oral cavity/oropharynx: No erythema or exudate, no lesions present no blood or clot Nasopharynx: No  mass or lesion with intact mucosa Neck Neck and Trachea: Midline trachea without mass or lesion Thyroid: No mass or nodularity Lymphatics: No lymphadenopathy  Procedure:  PROCEDURE NOTE: nasal endoscopy and right sided epistaxis control with packing   Preoperative diagnosis: epistaxis, right side  Postoperative diagnosis: same   Procedure: Diagnostic nasal endoscopy + control of nasal hemorrhage (16109)  Surgeon: Artice Last, M.D.  Anesthesia: Topical lidocaine and Afrin  H&P REVIEW: The patient's history and physical were reviewed today prior to procedure. All medications were reviewed and updated as well. Complications: None Condition is stable throughout exam Indications and consent: The patient presents with symptoms of chronic sinusitis not responding to previous therapies. All the risks, benefits, and potential complications were reviewed with the patient preoperatively and informed consent was obtained. The time out was completed with confirmation of the correct procedure.   Procedure: The patient was seated upright in the clinic. Topical lidocaine and Afrin were applied to the nasal cavity after removal of the Rhinorocket and adequate anesthesia had occurred, the rigid nasal endoscope was passed into the nasal cavity. The nasal mucosa, turbinates, septum, and sinus drainage pathways were visualized bilaterally. This revealed no purulence or significant secretions that might be cultured, but there was evidence of clot on the right  side which appeared to originate along the anterior septum and anterior/mid aspect of  the right inferior turbinates. There was evidence of left sided septal deviation and right sided caudal septal spur, but no polyps. There was no blood or clot in posterior oropharynx. To prevent any future epistaxis and to help the area heal we proceeded to place dissolvable nasal packing using Surgifoam and Surgicel. Patient tolerated procedure well and had no  evidence of persistent epistaxis at the end of the procedure.   Studies Reviewed: Labs PT/INR 25.9/2.3 on 07/16/23  Assessment/Plan: Encounter Diagnoses  Name Primary?   Epistaxis Yes   Anticoagulated     Assessment and Plan Assessment & Plan Epistaxis right side  Recurrent right-sided epistaxis exacerbated by warfarin, with recent ER visits and nasal packing. Abraded nasal mucosa observed on nasal endoscopy, septal deviation and clot but no active epistaxis. Warfarin increases bleeding risk, preventive measures needed. Re-packed with Surgicel and Surgifoam to prevent recurrent epistaxis and help with healing. - Use saline sprays multiple times daily. - Use Vaseline and a humidifier for moisture. - Use Afrin during active epistaxis, apply pressure for ten minutes at the nasal tip. - Purchase BleedStop for home use. - Gargle with ice cold water during epistaxis. - Ensure good blood pressure control.  Follow-up Scheduled follow-up in May - return as scheduled   Thank you for allowing me to participate in the care of this patient. Please do not hesitate to contact me with any questions or concerns.   Artice Last, MD Otolaryngology [facility] Phone: [phone] Fax: [phone]    07/20/2023, 6:11 PM

## 2023-07-20 NOTE — Telephone Encounter (Signed)
 Patient's daughter left a vm stating pharmacy did not have Ocean Nasal Spray r/x. Called pharmacy on file and they stated that the patient's insurance did not cover it and it would need to be purchased over the counter. Patient's daughter informed.

## 2023-08-02 ENCOUNTER — Encounter: Payer: Self-pay | Admitting: Family Medicine

## 2023-08-02 ENCOUNTER — Ambulatory Visit (INDEPENDENT_AMBULATORY_CARE_PROVIDER_SITE_OTHER): Payer: Medicare Other | Admitting: Family Medicine

## 2023-08-02 VITALS — BP 138/74 | HR 75 | Temp 98.4°F | Ht 60.0 in | Wt 147.0 lb

## 2023-08-02 DIAGNOSIS — R270 Ataxia, unspecified: Secondary | ICD-10-CM

## 2023-08-02 DIAGNOSIS — R519 Headache, unspecified: Secondary | ICD-10-CM

## 2023-08-02 DIAGNOSIS — G250 Essential tremor: Secondary | ICD-10-CM

## 2023-08-02 DIAGNOSIS — R443 Hallucinations, unspecified: Secondary | ICD-10-CM | POA: Diagnosis not present

## 2023-08-02 NOTE — Progress Notes (Signed)
 Subjective:    Patient ID: Jacqueline Morgan, female    DOB: February 20, 1941, 83 y.o.   MRN: [id number]  HPI 6 month f/u for chronic health conditions  Nose bleeds x's a couple times. Went to ED due to nose bleeds.  Discussed the use of AI scribe software for clinical note transcription with the patient, who gave verbal consent to proceed.  History of Present Illness   Jacqueline Morgan is an 83 year old female who presents with recurrent epistaxis and associated hallucinations.  She experienced a significant episode of epistaxis on the Sunday morning before Easter, necessitating emergency room intervention. Initial management included the insertion of a balloon, which was later replaced with a larger one due to continued bleeding. This balloon remained in place for four days. Subsequently, she was evaluated by an ENT specialist who removed the balloon and repacked her nose with a dissolvable material. Despite these interventions, she experienced another episode of bleeding last night, which she managed with Afrin spray and bleed stop products. No large amounts of bleeding have occurred since the ENT visit, only one minor episode.  Since the ENT visit, she has experienced hallucinations and episodes of syncope. She describes being 'out' for two to three days, with difficulty distinguishing between reality and dreams. She hears music in her head, including specific songs, and sees people who are not present. These symptoms began after her ENT appointment, where she required assistance to get into a car due to her altered mental state.  She reports a persistent headache on the right side of her head, with pain radiating from her temple. The headache worsens in the evening. She also experiences nausea without vomiting and has a decreased appetite, leading to inadequate food and fluid intake. Her energy levels are low, and she has been sleeping excessively during the day.  Her balance has worsened over  the past week, requiring assistance to walk. She has been using a cane at home to aid her mobility. She reports missing church for the entire month due to her symptoms and has been unable to drive until today, marking her first time driving in two to three weeks.      Patient describes pain discomfort on the right side of her head in the temple region.  Relates a lot of aching discomfort.  Also states auditory hallucinations where she hears music being played when she knows it is not being plagued she denies any visual hallucinations.  Review of Systems      Objective:   Physical Exam  General-in no acute distress Eyes-no discharge Lungs-respiratory rate normal, CTA CV-no murmurs,RRR Extremities skin warm dry no edema Neuro grossly normal Behavior normal, alert No active bleeding at the time of exam  Patient was oriented to person place self time was able to do 3 for 3 recall    Assessment & Plan:  Assessment and Plan    Epistaxis Severe epistaxis required emergency intervention. Minor bleeding persists. Advised against home removal of nasal packing. - Use Afrin spray as needed for minor bleeding. - Follow up with ENT in May.  Fatigue Fatigue likely multifactorial due to epistaxis, poor sleep, and possible underlying conditions. Fear of rebleeding may contribute. - Order blood test to check inflammatory markers. - Discuss with family the need for increased support and monitoring.  Balance disorder Balance disorder with worsening symptoms, increased fall risk. Exacerbated by fatigue and lack of sleep. - Advise use of a cane for improved balance and fall prevention.  Headache Right-sided headache with evening worsening. Possible relation to epistaxis and stress. Reports auditory hallucinations.  Hallucinations Auditory and visual hallucinations post-ENT visit. Possible relation to stress, fatigue, or other conditions. - - Discuss symptoms with family for additional  support.  Poor appetite Poor appetite possibly related to stress, fatigue, and underlying conditions.     1. 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  Seems a little worse recently.  Patient not resting well because she is very stressed by the nosebleeds I think she would benefit from having a head scan because of the pain and discomfort but we await the lab work first - Sedimentation rate - C-reactive protein  2. Ataxia See discussion above I find no focal findings - Sedimentation rate - C-reactive protein  3. Tremor, essential Stable - Sedimentation rate - C-reactive protein  4. Hallucinations, unspecified Doubt dementia.  If this persist will need further evaluation  - Sedimentation rate - C-reactive protein  I have touch base with ENT they will be seeing her within the next couple weeks and they will evaluate further for temporal arteritis

## 2023-08-03 ENCOUNTER — Telehealth: Payer: Self-pay | Admitting: Family Medicine

## 2023-08-03 LAB — SEDIMENTATION RATE: Sed Rate: 72 mm/h — ABNORMAL HIGH (ref 0–40)

## 2023-08-03 LAB — C-REACTIVE PROTEIN: CRP: 3 mg/L (ref 0–10)

## 2023-08-03 NOTE — Telephone Encounter (Signed)
 Call daughter Sherrlyn Dolores regarding elevated sed rate and headaches along with nosebleeds

## 2023-08-10 NOTE — Telephone Encounter (Signed)
 I spoke with patient by phone.  Her nose feels stuffy but she is not having any nasal bleeds.  In addition to this she does have occasional discomforts but nothing severe in regards to the frontal sinus temporal region she is seeing ENT for further evaluation to see if they feel that temporal arteritis could be an issue or may not be an issue.  She has an appointment already next week.  She will send us  an update within 2 weeks time

## 2023-08-14 ENCOUNTER — Ambulatory Visit: Attending: Cardiology | Admitting: *Deleted

## 2023-08-14 DIAGNOSIS — Z5181 Encounter for therapeutic drug level monitoring: Secondary | ICD-10-CM | POA: Diagnosis not present

## 2023-08-14 DIAGNOSIS — I82621 Acute embolism and thrombosis of deep veins of right upper extremity: Secondary | ICD-10-CM

## 2023-08-14 LAB — POCT INR: INR: 2.8 (ref 2.0–3.0)

## 2023-08-14 NOTE — Patient Instructions (Signed)
 Continue warfarin 1/2 tablet daily except 1 tablet on Mondays and Fridays.   ?Recheck INR 6 weeks  ?

## 2023-08-16 ENCOUNTER — Encounter (INDEPENDENT_AMBULATORY_CARE_PROVIDER_SITE_OTHER): Payer: Self-pay | Admitting: Otolaryngology

## 2023-08-16 ENCOUNTER — Telehealth: Payer: Self-pay

## 2023-08-16 ENCOUNTER — Ambulatory Visit (INDEPENDENT_AMBULATORY_CARE_PROVIDER_SITE_OTHER): Admitting: Otolaryngology

## 2023-08-16 VITALS — BP 113/59 | HR 88

## 2023-08-16 DIAGNOSIS — R42 Dizziness and giddiness: Secondary | ICD-10-CM

## 2023-08-16 DIAGNOSIS — R251 Tremor, unspecified: Secondary | ICD-10-CM

## 2023-08-16 DIAGNOSIS — R2689 Other abnormalities of gait and mobility: Secondary | ICD-10-CM | POA: Diagnosis not present

## 2023-08-16 DIAGNOSIS — H9193 Unspecified hearing loss, bilateral: Secondary | ICD-10-CM

## 2023-08-16 DIAGNOSIS — H938X1 Other specified disorders of right ear: Secondary | ICD-10-CM

## 2023-08-16 DIAGNOSIS — R040 Epistaxis: Secondary | ICD-10-CM

## 2023-08-16 DIAGNOSIS — H9201 Otalgia, right ear: Secondary | ICD-10-CM

## 2023-08-16 DIAGNOSIS — R519 Headache, unspecified: Secondary | ICD-10-CM

## 2023-08-16 NOTE — Progress Notes (Signed)
 ENT Progress Note:   Update 08/16/2023  Discussed the use of AI scribe software for clinical note transcription with the patient, who gave verbal consent to proceed.  Jacqueline Morgan is an 83 year old female who presents for evaluation of possible temporal arteritis and dizziness/balance problems. She was referred by her primary care doctor for evaluation of possible temporal arteritis based on elevated ESR and right temporal pain/discomfort associated with some vision changes on the right side.   She experiences dizziness characterized by a sensation of feeling like she is about to pass out. She has a history of cardiac issues but has not had any recent emergency room visits for these symptoms. Established with cardiology.   She has a history of epistaxis, with two recent episodes occurring on the left side, but notes improvement compared to previous occurrences (after her last ED visit and nasal packing). She has been using saline sprays and Vaseline for management.  She reports right ear symptoms including sensation of ear swelling, a sensation of fullness, and hearing difficulties, which have been ongoing for over a year. She describes pain in her right temple and it radiates around her right ear and down her neck. She has experienced vision changes, particularly in the right eye, but her eye doctor has not identified any specific issues.  She has a history of tremors and has seen neurology in the past, but was unable to take the prescribed medications due to interactions with warfarin. She was offered brain stimulation therapy but declined. She does not have teeth, which affects her ability to clench her jaw. She does not think she has TMJ sy.   Her inflammatory markers, including CRP and ESR, were elevated in recent tests.   Records Reviewed:  Office visit with Dr Fairy Homer 08/02/23 Jacqueline Morgan is an 83 year old female who presents with recurrent epistaxis and associated  hallucinations.   She experienced a significant episode of epistaxis on the Sunday morning before Easter, necessitating emergency room intervention. Initial management included the insertion of a balloon, which was later replaced with a larger one due to continued bleeding. This balloon remained in place for four days. Subsequently, she was evaluated by an ENT specialist who removed the balloon and repacked her nose with a dissolvable material. Despite these interventions, she experienced another episode of bleeding last night, which she managed with Afrin spray and bleed stop products. No large amounts of bleeding have occurred since the ENT visit, only one minor episode.   Since the ENT visit, she has experienced hallucinations and episodes of syncope. She describes being 'out' for two to three days, with difficulty distinguishing between reality and dreams. She hears music in her head, including specific songs, and sees people who are not present. These symptoms began after her ENT appointment, where she required assistance to get into a car due to her altered mental state.   She reports a persistent headache on the right side of her head, with pain radiating from her temple. The headache worsens in the evening. She also experiences nausea without vomiting and has a decreased appetite, leading to inadequate food and fluid intake. Her energy levels are low, and she has been sleeping excessively during the day.   Her balance has worsened over the past week, requiring assistance to walk. She has been using a cane at home to aid her mobility. She reports missing church for the entire month due to her symptoms and has been unable to drive until today,  marking her first time driving in two to three weeks.       Patient describes pain discomfort on the right side of her head in the temple region.  Relates a lot of aching discomfort.  Also states auditory hallucinations where she hears music being played when  she knows it is not being plagued she denies any visual hallucinations.   1. 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  Seems a little worse recently.  Patient not resting well because she is very stressed by the nosebleeds I think she would benefit from having a head scan because of the pain and discomfort but we await the lab work first - Sedimentation rate - C-reactive protein   2. Ataxia See discussion above I find no focal findings - Sedimentation rate - C-reactive protein   3. Tremor, essential Stable - Sedimentation rate - C-reactive protein   4. Hallucinations, unspecified Doubt dementia.  If this persist will need further evaluation   - Sedimentation rate - C-reactive protein   Initial Evaluation  Reason for Consult: recurrent epistaxis    HPI: Discussed the use of AI scribe software for clinical note transcription with the patient, who gave verbal consent to proceed.  History of Present Illness Jacqueline Morgan is an 83 year old female on warfarin who presents with recurrent epistaxis right side, requiring nasal packing in ED 4 days ago.  She has a history of recurrent epistaxis, primarily on the right side, and is currently on warfarin. She experienced a significant episode in March that resolved without intervention and another 4 days ago that required nasal packing at the ER. She has had several episodes in between, some of which she managed to stop at home using Vaseline and Afrin, although these measures were not always effective.  Her blood pressure has been well-controlled recently. After the ER visit in April, she noticed some blood around the packing but did not experience active bleeding. No sensation of blood running down her throat after the ER visit.  She has never had nasal packing before the recent ER visit. During the ER visit, a smaller  packing was initially used, but she bled around it, necessitating a larger packing. The packing has been in place for four days, causing significant discomfort and irritation.  She has been on warfarin for ten and a half years, and the epistaxis began in March, which was her first episode. She has been using Vaseline daily to manage the dryness in her nose.   Jacqueline Morgan is an 83 year old female who presents for evaluation of possible temporal arteritis. She was referred by her primary care doctor for evaluation of possible temporal arteritis.  She experiences dizziness characterized by a sensation of feeling like she is about to pass out. She has a history of cardiac issues but has not had any recent emergency room visits for these symptoms.  She has a history of epistaxis, with two recent episodes occurring on the left side, but notes improvement compared to previous occurrences. She has been using saline sprays and Vaseline for management.  She reports ear symptoms including swelling, a sensation of fullness, and hearing difficulties, which have been ongoing for over a year. She describes pain in her temples and down her neck. She has experienced vision changes, particularly in the right eye, but her eye doctor has not identified any specific issues.  She has a  history of tremors and has seen neurology in the past, but was unable to take the prescribed medications due to interactions with warfarin. She was offered brain stimulation therapy but declined. She does not have teeth, which affects her ability to clench her jaw.  Her inflammatory markers, including CRP and ESR, were elevated in recent tests.  Records Reviewed:  ED visit 07/16/23 Jacqueline Morgan is a 83 y.o. 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  Began this morning.  Coming out of the right nostril.  Tried Afrin at home without improvement.  Denies any  medication changes.  No syncope.  No nasal trauma   Had Rhinorocket placed  ED visit 06/11/23  Jacqueline Morgan is a 83 y.o. female.  Patient presents to the emergency department concerns of a nosebleed.  She reportedly takes warfarin.  She states that she was nervous about the nosebleed as she has on warfarin.  Denies any significant digital trauma.  States that she was blowing her nose when the bleeding started.  No recent similar episodes.  Denies any feelings of dizziness, lightheadedness, weakness.   Did not have packing    Past Medical History:  Diagnosis Date   Anxiety    Complication of anesthesia    passed out after dental surgery   Depression    Fibromyalgia    GERD (gastroesophageal reflux disease)    Hyperlipidemia    Hypertension    Palpitations    Tremor, essential 11/30/2015    Past Surgical History:  Procedure Laterality Date   COLONOSCOPY N/A 04/01/2015   Procedure: COLONOSCOPY;  Surgeon: Ruby Corporal, MD;  Location: [facility];  Service: Endoscopy;  Laterality: N/A;  1:45   COLONOSCOPY WITH PROPOFOL  N/A 10/18/2019   Procedure: COLONOSCOPY WITH PROPOFOL ;  Surgeon: Ruby Corporal, MD;  Location: [facility];  Service: Endoscopy;  Laterality: N/A;  730   EMBOLECTOMY Left 12/19/2012   Procedure: EMBOLECTOMY OF LEFT BRACHIAL ARTERY;  Surgeon: Arvil Lauber, MD;  Location: [facility] OR;  Service: Vascular;  Laterality: Left;   EYE SURGERY Bilateral    cataract   POLYPECTOMY  10/18/2019   Procedure: POLYPECTOMY;  Surgeon: Ruby Corporal, MD;  Location: [facility];  Service: Endoscopy;;    Family History  Problem Relation Age of Onset   Cancer Mother        ovarian, colon   Heart disease Mother    Heart attack Father    Glaucoma Father    COPD Sister    COPD Brother    Benign prostatic hyperplasia Brother     Social History:  reports that she has never smoked. She has never been exposed to tobacco smoke. She has never used smokeless tobacco. She  reports that she does not drink alcohol and does not use drugs.  Allergies:  Allergies  Allergen Reactions   Codeine     REACTION: Adverse gastrointestinal effects   Statins     Felt bad    Medications: I have reviewed the patient's current medications.   The PMH, PSH, Medications, Allergies, and SH were reviewed and updated.  ROS: Constitutional: Negative for fever, weight loss and weight gain. Cardiovascular: Negative for chest pain and dyspnea on exertion. Respiratory: Is not experiencing shortness of breath at rest. Gastrointestinal: Negative for nausea and vomiting. Neurological: Negative for headaches. Psychiatric: The patient is not nervous/anxious  Blood pressure (!) 113/59, pulse 88.  PHYSICAL EXAM:  Exam: General: Well-developed, well-nourished Respiratory  Respiratory effort: Equal inspiration and expiration without stridor Cardiovascular Peripheral Vascular: Warm extremities with equal color/perfusion Eyes: No nystagmus with equal extraocular motion bilaterally Neuro/Psych/Balance: Patient oriented to person, place, and time; Appropriate mood and affect; Gait is intact with no imbalance; Cranial nerves I-XII are intact. Unsteady slightly with Romberg, No nystagmus.  Head and Face Inspection: Normocephalic and atraumatic without mass or lesion Palpation: Facial skeleton intact without bony stepoffs Salivary Glands: No mass or tenderness Facial Strength: Facial motility symmetric and full bilaterally ENT Pinna: External ear intact and fully developed External canal: Canal is patent with intact skin Tympanic Membrane: Clear and mobile External Nose: No scar or anatomic deformity Internal Nose: Septum is deviated to the right Rhinorocket removed from the right side, small amount of blood clot and evidence of abraded mucosa along the anterior septum and inferior turbinate on the right. No active bleeding. No polyp, or purulence. No mass. Lips, Teeth, and gums:  Mucosa and teeth intact and viable TMJ: mild  pain to palpation on right side with full mobility Oral cavity/oropharynx: No erythema or exudate, no lesions present no blood or clot Nasopharynx: No mass or lesion with intact mucosa Neck Neck and Trachea: Midline trachea without mass or lesion Thyroid : No mass or nodularity Lymphatics: No lymphadenopathy    Studies Reviewed: Labs PT/INR 25.9/2.3 on 07/16/23 ESR 72 (high) 2 weeks ago  MRI brain 11/21/22 IMPRESSION: 1.  No evidence of an acute intracranial abnormality. 2. Chronic small vessel ischemic changes which are mild-to-moderate in the cerebral white matter, and minimal in the pons. These findings are similar to the prior brain MRI of 01/14/2014. 3. 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   Dizziness Yes   Balance disorder    Otalgia of right ear    Ear swelling, right    Ear fullness, right    Tremor    Temple tenderness      Assessment and Plan Assessment & Plan Epistaxis right side  Recurrent right-sided epistaxis exacerbated by warfarin, with recent ER visits and nasal packing. Abraded nasal mucosa observed on nasal endoscopy, septal deviation and clot but no active epistaxis. Warfarin increases bleeding risk, preventive measures needed. Re-packed with Surgicel and Surgifoam to prevent recurrent epistaxis and help with healing. - Use saline sprays multiple times daily. - Use Vaseline and a humidifier for moisture. - Use Afrin during active epistaxis, apply pressure for ten minutes at the nasal tip. - Purchase BleedStop for home use. - Gargle with ice cold water  during epistaxis. - Ensure good blood pressure control.  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 Biopsy needed to confirm/rule out diagnosis and guide treatment due to potential vision loss if undiagnosed.  - risks and benefits of the procedure discussed and she would like to proceed with right temporal artery biopsy with sedation. - Coordinate with cardiology to have plan for Warfarin prior to procedure - Expedite biopsy scheduling if possible   Hearing loss Intermittent hearing loss with ear fullness (Right side more affected). No infections and ear exam is unremarkable. Differential includes SNHL, CHL vs other pathology - Order hearing test. - Consider MRI if hearing test indicates asymmetric hearing loss especially since she also reports balance issues (although it is unclear what causes her sx, and some  of her sx are described more like dizziness and near-syncope)  Dizziness Balance problems, Ataxia  Dizziness description as near-syncope and unsteadiness suggests non-otologic causes, possible cardiac or neurologic etiology suspected. Could also be related to tremor, neuropathy. Will refer to vestibular therapy in case some of her sx could be related to vestibular dysfunction although my suspicion for that is low.  - Refer to cardiology for evaluation. 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 Vestibular rehab will help if sx are related to stroke.  - Refer to vestibular rehabilitation. - Reestablish neurology care - seen previously 2/2 hx of tremor, but has not seen for a long time - referral placed - will defer to neurology to determine if patient needs repeat brain MRI  Epistaxis Recurrent left-sided epistaxis, improved. No active bleeding, managed with saline and Vaseline. - Continue saline sprays and Vaseline.  Benign tremor Previous neurology evaluation. Unable to take medications due to warfarin interaction. No  current follow-up. - Reestablish neurology care - referral sent   F/u  Neurology and Cardiology F/u Vestibular rehab RTC for surgical procedure  Audiology   Artice Last, MD Otolaryngology [facility] Phone: [phone] Fax: [phone]    08/16/2023, 7:07 PM

## 2023-08-16 NOTE — Telephone Encounter (Signed)
   Pre-operative Risk Assessment    Patient Name: Jacqueline Morgan  DOB: [birth year] MRN: [id number]   Date of last office visit: 04/18/23 Lasalle Pointer, NP Date of next office visit: NONE   Request for Surgical Clearance    Procedure:  RIGHT TEMPORAL ARTERY BIOPSY  Date of Surgery:  Clearance TBD                                Surgeon:  DR. Artice Last Surgeon's Group or Practice Name:  [facility] Phone number:  [phone] Fax number:  [phone]   Type of Clearance Requested:   - Medical  - Pharmacy:  Hold Warfarin (Coumadin )     Type of Anesthesia:  General    Additional requests/questions:    SignedCollin Deal   08/16/2023, 4:33 PM

## 2023-08-16 NOTE — H&P (View-Only) (Signed)
 ENT Progress Note:   Update 08/16/2023  Discussed the use of AI scribe software for clinical note transcription with the patient, who gave verbal consent to proceed.  Jacqueline Morgan is an 83 year old female who presents for evaluation of possible temporal arteritis and dizziness/balance problems. She was referred by her primary care doctor for evaluation of possible temporal arteritis based on elevated ESR and right temporal pain/discomfort associated with some vision changes on the right side.   She experiences dizziness characterized by a sensation of feeling like she is about to pass out. She has a history of cardiac issues but has not had any recent emergency room visits for these symptoms. Established with cardiology.   She has a history of epistaxis, with two recent episodes occurring on the left side, but notes improvement compared to previous occurrences (after her last ED visit and nasal packing). She has been using saline sprays and Vaseline for management.  She reports right ear symptoms including sensation of ear swelling, a sensation of fullness, and hearing difficulties, which have been ongoing for over a year. She describes pain in her right temple and it radiates around her right ear and down her neck. She has experienced vision changes, particularly in the right eye, but her eye doctor has not identified any specific issues.  She has a history of tremors and has seen neurology in the past, but was unable to take the prescribed medications due to interactions with warfarin. She was offered brain stimulation therapy but declined. She does not have teeth, which affects her ability to clench her jaw. She does not think she has TMJ sy.   Her inflammatory markers, including CRP and ESR, were elevated in recent tests.   Records Reviewed:  Office visit with Dr Fairy Homer 08/02/23 Jacqueline Morgan is an 83 year old female who presents with recurrent epistaxis and associated  hallucinations.   She experienced a significant episode of epistaxis on the Sunday morning before Easter, necessitating emergency room intervention. Initial management included the insertion of a balloon, which was later replaced with a larger one due to continued bleeding. This balloon remained in place for four days. Subsequently, she was evaluated by an ENT specialist who removed the balloon and repacked her nose with a dissolvable material. Despite these interventions, she experienced another episode of bleeding last night, which she managed with Afrin spray and bleed stop products. No large amounts of bleeding have occurred since the ENT visit, only one minor episode.   Since the ENT visit, she has experienced hallucinations and episodes of syncope. She describes being 'out' for two to three days, with difficulty distinguishing between reality and dreams. She hears music in her head, including specific songs, and sees people who are not present. These symptoms began after her ENT appointment, where she required assistance to get into a car due to her altered mental state.   She reports a persistent headache on the right side of her head, with pain radiating from her temple. The headache worsens in the evening. She also experiences nausea without vomiting and has a decreased appetite, leading to inadequate food and fluid intake. Her energy levels are low, and she has been sleeping excessively during the day.   Her balance has worsened over the past week, requiring assistance to walk. She has been using a cane at home to aid her mobility. She reports missing church for the entire month due to her symptoms and has been unable to drive until today,  marking her first time driving in two to three weeks.       Patient describes pain discomfort on the right side of her head in the temple region.  Relates a lot of aching discomfort.  Also states auditory hallucinations where she hears music being played when  she knows it is not being plagued she denies any visual hallucinations.   1. 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  Seems a little worse recently.  Patient not resting well because she is very stressed by the nosebleeds I think she would benefit from having a head scan because of the pain and discomfort but we await the lab work first - Sedimentation rate - C-reactive protein   2. Ataxia See discussion above I find no focal findings - Sedimentation rate - C-reactive protein   3. Tremor, essential Stable - Sedimentation rate - C-reactive protein   4. Hallucinations, unspecified Doubt dementia.  If this persist will need further evaluation   - Sedimentation rate - C-reactive protein   Initial Evaluation  Reason for Consult: recurrent epistaxis    HPI: Discussed the use of AI scribe software for clinical note transcription with the patient, who gave verbal consent to proceed.  History of Present Illness Jacqueline Morgan is an 83 year old female on warfarin who presents with recurrent epistaxis right side, requiring nasal packing in ED 4 days ago.  She has a history of recurrent epistaxis, primarily on the right side, and is currently on warfarin. She experienced a significant episode in March that resolved without intervention and another 4 days ago that required nasal packing at the ER. She has had several episodes in between, some of which she managed to stop at home using Vaseline and Afrin, although these measures were not always effective.  Her blood pressure has been well-controlled recently. After the ER visit in April, she noticed some blood around the packing but did not experience active bleeding. No sensation of blood running down her throat after the ER visit.  She has never had nasal packing before the recent ER visit. During the ER visit, a smaller  packing was initially used, but she bled around it, necessitating a larger packing. The packing has been in place for four days, causing significant discomfort and irritation.  She has been on warfarin for ten and a half years, and the epistaxis began in March, which was her first episode. She has been using Vaseline daily to manage the dryness in her nose.   Jacqueline Morgan is an 83 year old female who presents for evaluation of possible temporal arteritis. She was referred by her primary care doctor for evaluation of possible temporal arteritis.  She experiences dizziness characterized by a sensation of feeling like she is about to pass out. She has a history of cardiac issues but has not had any recent emergency room visits for these symptoms.  She has a history of epistaxis, with two recent episodes occurring on the left side, but notes improvement compared to previous occurrences. She has been using saline sprays and Vaseline for management.  She reports ear symptoms including swelling, a sensation of fullness, and hearing difficulties, which have been ongoing for over a year. She describes pain in her temples and down her neck. She has experienced vision changes, particularly in the right eye, but her eye doctor has not identified any specific issues.  She has a  history of tremors and has seen neurology in the past, but was unable to take the prescribed medications due to interactions with warfarin. She was offered brain stimulation therapy but declined. She does not have teeth, which affects her ability to clench her jaw.  Her inflammatory markers, including CRP and ESR, were elevated in recent tests.  Records Reviewed:  ED visit 07/16/23 Jacqueline Morgan is a 83 y.o. 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  Began this morning.  Coming out of the right nostril.  Tried Afrin at home without improvement.  Denies any  medication changes.  No syncope.  No nasal trauma   Had Rhinorocket placed  ED visit 06/11/23  Jacqueline Morgan is a 83 y.o. female.  Patient presents to the emergency department concerns of a nosebleed.  She reportedly takes warfarin.  She states that she was nervous about the nosebleed as she has on warfarin.  Denies any significant digital trauma.  States that she was blowing her nose when the bleeding started.  No recent similar episodes.  Denies any feelings of dizziness, lightheadedness, weakness.   Did not have packing    Past Medical History:  Diagnosis Date   Anxiety    Complication of anesthesia    passed out after dental surgery   Depression    Fibromyalgia    GERD (gastroesophageal reflux disease)    Hyperlipidemia    Hypertension    Palpitations    Tremor, essential 11/30/2015    Past Surgical History:  Procedure Laterality Date   COLONOSCOPY N/A 04/01/2015   Procedure: COLONOSCOPY;  Surgeon: Ruby Corporal, MD;  Location: [facility];  Service: Endoscopy;  Laterality: N/A;  1:45   COLONOSCOPY WITH PROPOFOL  N/A 10/18/2019   Procedure: COLONOSCOPY WITH PROPOFOL ;  Surgeon: Ruby Corporal, MD;  Location: [facility];  Service: Endoscopy;  Laterality: N/A;  730   EMBOLECTOMY Left 12/19/2012   Procedure: EMBOLECTOMY OF LEFT BRACHIAL ARTERY;  Surgeon: Arvil Lauber, MD;  Location: [facility] OR;  Service: Vascular;  Laterality: Left;   EYE SURGERY Bilateral    cataract   POLYPECTOMY  10/18/2019   Procedure: POLYPECTOMY;  Surgeon: Ruby Corporal, MD;  Location: [facility];  Service: Endoscopy;;    Family History  Problem Relation Age of Onset   Cancer Mother        ovarian, colon   Heart disease Mother    Heart attack Father    Glaucoma Father    COPD Sister    COPD Brother    Benign prostatic hyperplasia Brother     Social History:  reports that she has never smoked. She has never been exposed to tobacco smoke. She has never used smokeless tobacco. She  reports that she does not drink alcohol and does not use drugs.  Allergies:  Allergies  Allergen Reactions   Codeine     REACTION: Adverse gastrointestinal effects   Statins     Felt bad    Medications: I have reviewed the patient's current medications.   The PMH, PSH, Medications, Allergies, and SH were reviewed and updated.  ROS: Constitutional: Negative for fever, weight loss and weight gain. Cardiovascular: Negative for chest pain and dyspnea on exertion. Respiratory: Is not experiencing shortness of breath at rest. Gastrointestinal: Negative for nausea and vomiting. Neurological: Negative for headaches. Psychiatric: The patient is not nervous/anxious  Blood pressure (!) 113/59, pulse 88.  PHYSICAL EXAM:  Exam: General: Well-developed, well-nourished Respiratory  Respiratory effort: Equal inspiration and expiration without stridor Cardiovascular Peripheral Vascular: Warm extremities with equal color/perfusion Eyes: No nystagmus with equal extraocular motion bilaterally Neuro/Psych/Balance: Patient oriented to person, place, and time; Appropriate mood and affect; Gait is intact with no imbalance; Cranial nerves I-XII are intact. Unsteady slightly with Romberg, No nystagmus.  Head and Face Inspection: Normocephalic and atraumatic without mass or lesion Palpation: Facial skeleton intact without bony stepoffs Salivary Glands: No mass or tenderness Facial Strength: Facial motility symmetric and full bilaterally ENT Pinna: External ear intact and fully developed External canal: Canal is patent with intact skin Tympanic Membrane: Clear and mobile External Nose: No scar or anatomic deformity Internal Nose: Septum is deviated to the right Rhinorocket removed from the right side, small amount of blood clot and evidence of abraded mucosa along the anterior septum and inferior turbinate on the right. No active bleeding. No polyp, or purulence. No mass. Lips, Teeth, and gums:  Mucosa and teeth intact and viable TMJ: mild  pain to palpation on right side with full mobility Oral cavity/oropharynx: No erythema or exudate, no lesions present no blood or clot Nasopharynx: No mass or lesion with intact mucosa Neck Neck and Trachea: Midline trachea without mass or lesion Thyroid : No mass or nodularity Lymphatics: No lymphadenopathy    Studies Reviewed: Labs PT/INR 25.9/2.3 on 07/16/23 ESR 72 (high) 2 weeks ago  MRI brain 11/21/22 IMPRESSION: 1.  No evidence of an acute intracranial abnormality. 2. Chronic small vessel ischemic changes which are mild-to-moderate in the cerebral white matter, and minimal in the pons. These findings are similar to the prior brain MRI of 01/14/2014. 3. 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   Dizziness Yes   Balance disorder    Otalgia of right ear    Ear swelling, right    Ear fullness, right    Tremor    Temple tenderness      Assessment and Plan Assessment & Plan Epistaxis right side  Recurrent right-sided epistaxis exacerbated by warfarin, with recent ER visits and nasal packing. Abraded nasal mucosa observed on nasal endoscopy, septal deviation and clot but no active epistaxis. Warfarin increases bleeding risk, preventive measures needed. Re-packed with Surgicel and Surgifoam to prevent recurrent epistaxis and help with healing. - Use saline sprays multiple times daily. - Use Vaseline and a humidifier for moisture. - Use Afrin during active epistaxis, apply pressure for ten minutes at the nasal tip. - Purchase BleedStop for home use. - Gargle with ice cold water  during epistaxis. - Ensure good blood pressure control.  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 Biopsy needed to confirm/rule out diagnosis and guide treatment due to potential vision loss if undiagnosed.  - risks and benefits of the procedure discussed and she would like to proceed with right temporal artery biopsy with sedation. - Coordinate with cardiology to have plan for Warfarin prior to procedure - Expedite biopsy scheduling if possible   Hearing loss Intermittent hearing loss with ear fullness (Right side more affected). No infections and ear exam is unremarkable. Differential includes SNHL, CHL vs other pathology - Order hearing test. - Consider MRI if hearing test indicates asymmetric hearing loss especially since she also reports balance issues (although it is unclear what causes her sx, and some  of her sx are described more like dizziness and near-syncope)  Dizziness Balance problems, Ataxia  Dizziness description as near-syncope and unsteadiness suggests non-otologic causes, possible cardiac or neurologic etiology suspected. Could also be related to tremor, neuropathy. Will refer to vestibular therapy in case some of her sx could be related to vestibular dysfunction although my suspicion for that is low.  - Refer to cardiology for evaluation. 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 Vestibular rehab will help if sx are related to stroke.  - Refer to vestibular rehabilitation. - Reestablish neurology care - seen previously 2/2 hx of tremor, but has not seen for a long time - referral placed - will defer to neurology to determine if patient needs repeat brain MRI  Epistaxis Recurrent left-sided epistaxis, improved. No active bleeding, managed with saline and Vaseline. - Continue saline sprays and Vaseline.  Benign tremor Previous neurology evaluation. Unable to take medications due to warfarin interaction. No  current follow-up. - Reestablish neurology care - referral sent   F/u  Neurology and Cardiology F/u Vestibular rehab RTC for surgical procedure  Audiology   Artice Last, MD Otolaryngology [facility] Phone: [phone] Fax: [phone]    08/16/2023, 7:07 PM

## 2023-08-18 NOTE — Progress Notes (Signed)
 Assessment/Plan:   1.  Essential Tremor, moderately severe.  - Discussed once again the nature and pathophysiology, as we did last visit.  I discussed again primidone .  I told her that she can take this with her Coumadin , so long as we monitor the INR.  I did also tell her that she would likely will not get great control on tremor without surgical intervention.  She was very honest and stated that she just didn't want to take medication and would rather have tremor than take medication or undergo surgery.  This is reasonable.  -she has a liftware spoon/spork  2.  Headache  - Following with ENT and scheduled for temporal artery biopsy on May 20  -doesn't sound completely consistent with TA but agree with biopsy  -wonder if has cervicogenic headache and we discussed this, along with prior MRI cervical spine.  She doesn't want surgical opinion but will let us  know if changes mind.  3.  Dizziness  -has been referred to vestibular rehab.  She was debating not going and daughter and I encouraged her to attend.  4.  F/u prn    Subjective:   Jacqueline Morgan was seen in consultation in follow-up. Pt with her daughter who supplements hx.   I saw the patient about a year ago.  That was the only time I have ever seen her.  At that point in time, I diagnosed her with moderately severe essential tremor.  I started her on primidone , 50 mg twice a day.  We discussed potential interactions with Coumadin , which is not an absolute contraindication since INR's are being monitored anyway.  I did tell her that the low-dose that we gave her was likely not going to be enough and we would have to titrate the dose up.  I also told her that she would not get adequate control on tremor without surgical intervention.  She never followed back up after that visit.  We did order an MRI of the cervical spine at our last visit because of hyperreflexia.  She had multilevel facet arthropathy, most significant at C7-T1 where  there was periarticular edema.  There was severe neuroforaminal stenosis on the right at C3-C4.  We offered to send her to neurosurgery for an opinion and likely injections, but she ultimately declined that.  More recently, she was to her primary care physician with head pain and nosebleeds.  She was sent to ENT for concerns with temporal arteritis.  Sedimentation rate was 72.  Temporal artery biopsy is not scheduled for May 28.  She has been having holocephalic HA since last August (had MRI brain last aug from it).  The headache started to concentrate in the R temporal region over the last 6 months but its now in the R neck and behind the R ear.  She has had headaches all her life but never radiated behind the R ear and neck.  She describes allodynia.  She has trouble laying on the R side of the head.  She is nauseated every morning but not sure if from this.  She describes some blurred vision from the R and has been x 6 months.  She saw Dr. Candi Chafe and he didn't note changes per pt.  When she saw ENT, she was referred back here for the tremor that we had seen her for previously.  ENT notes indicate that patient did not take the primidone  because of interaction with Coumadin .  Allergies  Allergen Reactions   Codeine  REACTION: Adverse gastrointestinal effects   Statins     Felt bad    Current Meds  Medication Sig   acetaminophen  (TYLENOL ) 500 MG tablet Take 1,000 mg by mouth every 8 (eight) hours as needed for moderate pain (pain score 4-6). For headache/pain   albuterol  (VENTOLIN  HFA) 108 (90 Base) MCG/ACT inhaler Inhale 1-2 puffs into the lungs every 6 (six) hours as needed for wheezing or shortness of breath.   blood glucose meter kit and supplies Dispense based on patient and insurance preference. Use to check sugars once per day. (FOR ICD-10 E10.9, E11.9).   fluticasone  (FLONASE ) 50 MCG/ACT nasal spray Place 2 sprays into both nostrils daily.   glucose blood (ONETOUCH VERIO) test strip USE TO  CHECK SUGAR ONCE DAILY DX CODE R73.03, E11.69, E78.5   Lancets (ONETOUCH DELICA PLUS LANCET33G) MISC USE TO CHECK SUGAR ONCE DAILY DX CODE R73.03, E11.69, E78.5   latanoprost  (XALATAN ) 0.005 % ophthalmic solution Place 1 drop into both eyes at bedtime.   LORazepam  (ATIVAN ) 1 MG tablet TAKE (1/2) TABLET DURING THE DAY AND (1) TABLET AT BEDTIME AS NEEDED.   metFORMIN  (GLUCOPHAGE -XR) 500 MG 24 hr tablet TAKE 1 TABLET BY MOUTH DAILY  WITH BREAKFAST   metoprolol  succinate (TOPROL -XL) 25 MG 24 hr tablet TAKE 1 TABLET BY MOUTH ONCE  DAILY   oxyCODONE  (ROXICODONE ) 5 MG immediate release tablet Take 1 tablet (5 mg total) by mouth every 4 (four) hours as needed for severe pain (pain score 7-10).   pantoprazole  (PROTONIX ) 40 MG tablet TAKE 1 TABLET BY MOUTH TWICE  DAILY   PARoxetine  (PAXIL ) 20 MG tablet TAKE 1 TABLET BY MOUTH IN THE  MORNING AND 2 TABLETS BY MOUTH  IN THE EVENING   rosuvastatin  (CRESTOR ) 10 MG tablet TAKE 1 TABLET BY MOUTH DAILY   sodium chloride  (OCEAN) 0.65 % SOLN nasal spray Place 1 spray into both nostrils as needed.   sucralfate  (CARAFATE ) 1 g tablet TAKE 1 TABLET BY MOUTH BEFORE MEALS AND AT BEDTIME. (Patient taking differently: Take 1 g by mouth daily as needed (ulcers).)   warfarin (COUMADIN ) 5 MG tablet TAKE 1/2 -1 TABLET BY MOUTH DAILY AS DIRECTED. (Patient taking differently: Take 2.5-5 mg by mouth See admin instructions. Take 5 mg by mouth on Monday and Friday and take 2.5 mg on Tuesday-Thursday, Saturday and Sunday)      Objective:   VITALS:   Vitals:   08/21/23 0855  BP: 110/78  Pulse: 83  SpO2: (!) 87%  Weight: 148 lb (67.1 kg)  Height: 5' (1.524 m)    Gen:  Appears stated age and in NAD. HEENT:  Normocephalic, atraumatic. The mucous membranes are moist. The superficial temporal arteries are without ropiness or tenderness. Cardiovascular: Regular rate and rhythm. Lungs: Clear to auscultation bilaterally. Neck: There are no carotid bruits noted  bilaterally.  NEUROLOGICAL:  Orientation:  The patient is alert and oriented x 3.   Cranial nerves: There is good facial symmetry. Extraocular muscles are intact and visual fields are full to confrontational testing. Speech is fluent and clear. Soft palate rises symmetrically and there is no tongue deviation. Hearing is intact to conversational tone. Tone: Tone is good throughout. Sensation: Sensation is intact to light touch touch throughout Coordination:  The patient has no dysdiadichokinesia or dysmetria. Motor: Strength is 5/5 in the bilateral upper and lower extremities.  Shoulder shrug is equal bilaterally.  There is no pronator drift.  There are no fasciculations noted.  MOVEMENT EXAM: Tremor:  There is  jaw tremor.  There is rest tremor on both arms, right greater than left, but it is not nearly as significant as the postural and intention tremor.  The R arm is much more significant than the L with postural/intention tremor.  The R is mod to severe and the L is more mild.    I have reviewed and interpreted the following labs independently   Chemistry      Component Value Date/Time   NA 137 07/16/2023 0928   NA 138 07/10/2023 1329   K 4.1 07/16/2023 0928   CL 100 07/16/2023 0928   CO2 25 07/16/2023 0928   BUN 17 07/16/2023 0928   BUN 15 07/10/2023 1329   CREATININE 1.11 (H) 07/16/2023 0928   CREATININE 1.17 (H) 09/03/2019 1417      Component Value Date/Time   CALCIUM  9.0 07/16/2023 0928   ALKPHOS 96 07/10/2023 1329   AST 19 07/10/2023 1329   ALT 12 07/10/2023 1329   BILITOT 0.3 07/10/2023 1329      Lab Results  Component Value Date   WBC 10.1 07/16/2023   HGB 12.3 07/16/2023   HCT 39.2 07/16/2023   MCV 90.3 07/16/2023   PLT 343 07/16/2023   Lab Results  Component Value Date   TSH 0.446 (L) 11/16/2020      Total time spent on today's visit was 30 minutes, including both face-to-face time and nonface-to-face time.  Time included that spent on review of records  (prior notes available to me/labs/imaging if pertinent), discussing treatment and goals, answering patient's questions and coordinating care.  CC:  Bennet Brasil, MD

## 2023-08-21 ENCOUNTER — Telehealth: Payer: Self-pay

## 2023-08-21 ENCOUNTER — Ambulatory Visit: Admitting: Neurology

## 2023-08-21 ENCOUNTER — Encounter: Payer: Self-pay | Admitting: Neurology

## 2023-08-21 VITALS — BP 110/78 | HR 83 | Ht 60.0 in | Wt 148.0 lb

## 2023-08-21 DIAGNOSIS — R519 Headache, unspecified: Secondary | ICD-10-CM | POA: Diagnosis not present

## 2023-08-21 DIAGNOSIS — G250 Essential tremor: Secondary | ICD-10-CM | POA: Diagnosis not present

## 2023-08-21 NOTE — Telephone Encounter (Signed)
  Patient Consent for Virtual Visit        Jacqueline Morgan has provided verbal consent on 08/21/2023 for a virtual visit (video or telephone).  Appt scheduled for 08/24/23 @ 2pm. Med rec and consent complete. Call patient at [phone]   CONSENT FOR VIRTUAL VISIT FOR:  Jacqueline Morgan  By participating in this virtual visit I agree to the following:  I hereby voluntarily request, consent and authorize [facility] and its employed or contracted physicians, physician assistants, nurse practitioners or other licensed health care professionals (the Practitioner), to provide me with telemedicine health care services (the "Services") as deemed necessary by the treating Practitioner. I acknowledge and consent to receive the Services by the Practitioner via telemedicine. I understand that the telemedicine visit will involve communicating with the Practitioner through live audiovisual communication technology and the disclosure of certain medical information by electronic transmission. I acknowledge that I have been given the opportunity to request an in-person assessment or other available alternative prior to the telemedicine visit and am voluntarily participating in the telemedicine visit.  I understand that I have the right to withhold or withdraw my consent to the use of telemedicine in the course of my care at any time, without affecting my right to future care or treatment, and that the Practitioner or I may terminate the telemedicine visit at any time. I understand that I have the right to inspect all information obtained and/or recorded in the course of the telemedicine visit and may receive copies of available information for a reasonable fee.  I understand that some of the potential risks of receiving the Services via telemedicine include:  Delay or interruption in medical evaluation due to technological equipment failure or disruption; Information transmitted may not be sufficient  (e.g. poor resolution of images) to allow for appropriate medical decision making by the Practitioner; and/or  In rare instances, security protocols could fail, causing a breach of personal health information.  Furthermore, I acknowledge that it is my responsibility to provide information about my medical history, conditions and care that is complete and accurate to the best of my ability. I acknowledge that Practitioner's advice, recommendations, and/or decision may be based on factors not within their control, such as incomplete or inaccurate data provided by me or distortions of diagnostic images or specimens that may result from electronic transmissions. I understand that the practice of medicine is not an exact science and that Practitioner makes no warranties or guarantees regarding treatment outcomes. I acknowledge that a copy of this consent can be made available to me via my patient portal [facility] MyChart), or I can request a printed copy by calling the office of  [facility].    I understand that my insurance will be billed for this visit.   I have read or had this consent read to me. I understand the contents of this consent, which adequately explains the benefits and risks of the Services being provided via telemedicine.  I have been provided ample opportunity to ask questions regarding this consent and the Services and have had my questions answered to my satisfaction. I give my informed consent for the services to be provided through the use of telemedicine in my medical care

## 2023-08-21 NOTE — Telephone Encounter (Signed)
 Appt scheduled for 08/24/23 @ 2pm. Med rec and consent complete. Call patient at [phone]

## 2023-08-21 NOTE — Patient Instructions (Signed)
 Good to see you!  We discussed that you want no treatment for tremor.  We discussed that vestibular rehab may be helpful for dizziness and balance.  We discussed that if the biopsy is negative, you may have a component of cervicogenic headaches from your neck.  The physicians and staff at [facility] are committed to providing excellent care. You may receive a survey requesting feedback about your experience at our office. We strive to receive "very good" responses to the survey questions. If you feel that your experience would prevent you from giving the office a "very good " response, please contact our office to try to remedy the situation. We may be reached at [phone]. Thank you for taking the time out of your busy day to complete the survey.

## 2023-08-21 NOTE — Pre-Procedure Instructions (Signed)
 Surgical Instructions   Your procedure is scheduled on Wednesday, May 28th. Report to [facility] Main Entrance "A" at 08:00 A.M., then check in with the Admitting office. Any questions or running late day of surgery: call [phone]  Questions prior to your surgery date: call [phone], Monday-Friday, 8am-4pm. If you experience any cold or flu symptoms such as cough, fever, chills, shortness of breath, etc. between now and your scheduled surgery, please notify us  at the above number.     Remember:  Do not eat after midnight the night before your surgery   You may drink clear liquids until 07:00 AM the morning of your surgery.   Clear liquids allowed are: Water , Non-Citrus Juices (without pulp), Carbonated Beverages, Clear Tea (no milk, honey, etc.), Black Coffee Only (NO MILK, CREAM OR POWDERED CREAMER of any kind), and Gatorade.    Take these medicines the morning of surgery with A SIP OF WATER   fluticasone  (FLONASE )  metoprolol  succinate (TOPROL -XL)  pantoprazole  (PROTONIX )  PARoxetine  (PAXIL )  rosuvastatin  (CRESTOR )   May take these medicines IF NEEDED: acetaminophen  (TYLENOL )  albuterol  (VENTOLIN  HFA)- bring inhaler with you on day of surgery oxyCODONE  (ROXICODONE )  sodium chloride  (OCEAN) nasal spray   Follow your surgeon's instructions on when to stop warfarin (Coumadin ).  If no instructions were given by your surgeon then you will need to call the office to get those instructions.    One week prior to surgery, STOP taking any Aspirin  (unless otherwise instructed by your surgeon) Aleve, Naproxen, Ibuprofen , Motrin , Advil , Goody's, BC's, all herbal medications, fish oil, and non-prescription vitamins.  WHAT DO I DO ABOUT MY DIABETES MEDICATION?   Do not take metFORMIN  (GLUCOPHAGE -XR) the morning of surgery.    HOW TO MANAGE YOUR DIABETES BEFORE AND AFTER SURGERY  Why is it important to control my blood sugar before and after surgery? Improving blood sugar  levels before and after surgery helps healing and can limit problems. A way of improving blood sugar control is eating a healthy diet by:  Eating less sugar and carbohydrates  Increasing activity/exercise  Talking with your doctor about reaching your blood sugar goals High blood sugars (greater than 180 mg/dL) can raise your risk of infections and slow your recovery, so you will need to focus on controlling your diabetes during the weeks before surgery. Make sure that the doctor who takes care of your diabetes knows about your planned surgery including the date and location.  How do I manage my blood sugar before surgery? Check your blood sugar at least 4 times a day, starting 2 days before surgery, to make sure that the level is not too high or low.  Check your blood sugar the morning of your surgery when you wake up and every 2 hours until you get to the Short Stay unit.  If your blood sugar is less than 70 mg/dL, you will need to treat for low blood sugar: Do not take insulin. Treat a low blood sugar (less than 70 mg/dL) with  cup of clear juice (cranberry or apple), 4 glucose tablets, OR glucose gel. Recheck blood sugar in 15 minutes after treatment (to make sure it is greater than 70 mg/dL). If your blood sugar is not greater than 70 mg/dL on recheck, call [phone] for further instructions. Report your blood sugar to the short stay nurse when you get to Short Stay.  If you are admitted to the hospital after surgery: Your blood sugar will be checked by the staff and you will probably  be given insulin after surgery (instead of oral diabetes medicines) to make sure you have good blood sugar levels. The goal for blood sugar control after surgery is 80-180 mg/dL.                     Do NOT Smoke (Tobacco/Vaping) for 24 hours prior to your procedure.  If you use a CPAP at night, you may bring your mask/headgear for your overnight stay.   You will be asked to remove any contacts,  glasses, piercing's, hearing aid's, dentures/partials prior to surgery. Please bring cases for these items if needed.    Patients discharged the day of surgery will not be allowed to drive home, and someone needs to stay with them for 24 hours.  SURGICAL WAITING ROOM VISITATION Patients may have no more than 2 support people in the waiting area - these visitors may rotate.   Pre-op nurse will coordinate an appropriate time for 1 ADULT support person, who may not rotate, to accompany patient in pre-op.  Children under the age of 20 must have an adult with them who is not the patient and must remain in the main waiting area with an adult.  If the patient needs to stay at the hospital during part of their recovery, the visitor guidelines for inpatient rooms apply.  Please refer to the [facility] website for the visitor guidelines for any additional information.   If you received a COVID test during your pre-op visit  it is requested that you wear a mask when out in public, stay away from anyone that may not be feeling well and notify your surgeon if you develop symptoms. If you have been in contact with anyone that has tested positive in the last 10 days please notify you surgeon.      Pre-operative CHG Bathing Instructions   You can play a key role in reducing the risk of infection after surgery. Your skin needs to be as free of germs as possible. You can reduce the number of germs on your skin by washing with CHG (chlorhexidine  gluconate) soap before surgery. CHG is an antiseptic soap that kills germs and continues to kill germs even after washing.   DO NOT use if you have an allergy to chlorhexidine /CHG or antibacterial soaps. If your skin becomes reddened or irritated, stop using the CHG and notify one of our RNs at [phone].              TAKE A SHOWER THE NIGHT BEFORE SURGERY AND THE DAY OF SURGERY    Please keep in mind the following:  DO NOT shave, including legs and underarms,  48 hours prior to surgery.   You may shave your face before/day of surgery.  Place clean sheets on your bed the night before surgery Use a clean washcloth (not used since being washed) for each shower. DO NOT sleep with pet's night before surgery.  CHG Shower Instructions:  Wash your face and private area with normal soap. If you choose to wash your hair, wash first with your normal shampoo.  After you use shampoo/soap, rinse your hair and body thoroughly to remove shampoo/soap residue.  Turn the water  OFF and apply half the bottle of CHG soap to a CLEAN washcloth.  Apply CHG soap ONLY FROM YOUR NECK DOWN TO YOUR TOES (washing for 3-5 minutes)  DO NOT use CHG soap on face, private areas, open wounds, or sores.  Pay special attention to the area where your  surgery is being performed.  If you are having back surgery, having someone wash your back for you may be helpful. Wait 2 minutes after CHG soap is applied, then you may rinse off the CHG soap.  Pat dry with a clean towel  Put on clean pajamas    Additional instructions for the day of surgery: DO NOT APPLY any lotions, deodorants, cologne, or perfumes.   Do not wear jewelry or makeup Do not wear nail polish, gel polish, artificial nails, or any other type of covering on natural nails (fingers and toes) Do not bring valuables to the hospital. [facility] is not responsible for valuables/personal belongings. Put on clean/comfortable clothes.  Please brush your teeth.  Ask your nurse before applying any prescription medications to the skin.

## 2023-08-21 NOTE — Telephone Encounter (Signed)
Calling for update. Please advise  

## 2023-08-22 ENCOUNTER — Encounter (HOSPITAL_COMMUNITY)
Admission: RE | Admit: 2023-08-22 | Discharge: 2023-08-22 | Disposition: A | Discharge status: Home / Self Care | Source: Ambulatory Visit | Attending: *Deleted | Admitting: *Deleted

## 2023-08-22 ENCOUNTER — Other Ambulatory Visit: Payer: Self-pay

## 2023-08-22 ENCOUNTER — Encounter (HOSPITAL_COMMUNITY): Payer: Self-pay

## 2023-08-22 VITALS — BP 123/66 | HR 72 | Temp 98.2°F | Resp 16 | Ht 60.0 in | Wt 148.2 lb

## 2023-08-22 DIAGNOSIS — Z86718 Personal history of other venous thrombosis and embolism: Secondary | ICD-10-CM | POA: Insufficient documentation

## 2023-08-22 DIAGNOSIS — R519 Headache, unspecified: Secondary | ICD-10-CM | POA: Insufficient documentation

## 2023-08-22 DIAGNOSIS — E119 Type 2 diabetes mellitus without complications: Secondary | ICD-10-CM | POA: Insufficient documentation

## 2023-08-22 DIAGNOSIS — Z01812 Encounter for preprocedural laboratory examination: Secondary | ICD-10-CM | POA: Diagnosis not present

## 2023-08-22 DIAGNOSIS — Z7901 Long term (current) use of anticoagulants: Secondary | ICD-10-CM | POA: Diagnosis not present

## 2023-08-22 DIAGNOSIS — Z7984 Long term (current) use of oral hypoglycemic drugs: Secondary | ICD-10-CM | POA: Diagnosis not present

## 2023-08-22 DIAGNOSIS — Z01818 Encounter for other preprocedural examination: Secondary | ICD-10-CM | POA: Diagnosis not present

## 2023-08-22 DIAGNOSIS — I10 Essential (primary) hypertension: Secondary | ICD-10-CM | POA: Diagnosis not present

## 2023-08-22 HISTORY — DX: Unspecified osteoarthritis, unspecified site: M19.90

## 2023-08-22 HISTORY — DX: Type 2 diabetes mellitus without complications: E11.9

## 2023-08-22 LAB — CBC
HCT: 38 % (ref 36.0–46.0)
Hemoglobin: 11.8 g/dL — ABNORMAL LOW (ref 12.0–15.0)
MCH: 27.6 pg (ref 26.0–34.0)
MCHC: 31.1 g/dL (ref 30.0–36.0)
MCV: 89 fL (ref 80.0–100.0)
Platelets: 362 10*3/uL (ref 150–400)
RBC: 4.27 MIL/uL (ref 3.87–5.11)
RDW: 14.6 % (ref 11.5–15.5)
WBC: 7.3 10*3/uL (ref 4.0–10.5)
nRBC: 0 % (ref 0.0–0.2)

## 2023-08-22 LAB — BASIC METABOLIC PANEL WITH GFR
Anion gap: 9 (ref 5–15)
BUN: 14 mg/dL (ref 8–23)
CO2: 27 mmol/L (ref 22–32)
Calcium: 9.4 mg/dL (ref 8.9–10.3)
Chloride: 101 mmol/L (ref 98–111)
Creatinine, Ser: 1.09 mg/dL — ABNORMAL HIGH (ref 0.44–1.00)
GFR, Estimated: 51 mL/min — ABNORMAL LOW (ref >60)
Glucose, Bld: 121 mg/dL — ABNORMAL HIGH (ref 70–99)
Potassium: 4.5 mmol/L (ref 3.5–5.1)
Sodium: 137 mmol/L (ref 135–145)

## 2023-08-22 LAB — GLUCOSE, CAPILLARY: Glucose-Capillary: 147 mg/dL — ABNORMAL HIGH (ref 70–99)

## 2023-08-22 NOTE — Progress Notes (Addendum)
 PCP - Bennet Brasil, MD  Cardiologist -   Armida Lander, MD     PPM/ICD - denies Device Orders - n/a Rep Notified - n/a  Chest x-ray - 04-03-23 EKG - 04-18-23 Stress Test - 10-07-15 ECHO - 08-21-19 Cardiac Cath -   Sleep Study - denies CPAP - n/a  Fasting Blood Sugar - Per patient blood sugar ranges 80-100.  Blood sugar 147 at PAT appointment  Checks Blood Sugar daily Patient instructed to hold metformin  day of surgery  Last dose of GLP1 agonist-  denies GLP1 instructions: n/a  Blood Thinner Instructions:warfarin (COUMADIN ) Patient has a phone call appointment with Dr. Amanda Jungling on 08-23-23 at 2:00 PM for instructions regarding coumadin . Per patient she does follow with coumadin  clinic for PT/INR checks. Per patient INR level are kept between 2-3. Aspirin  Instructions: n/a  ERAS Protcol -NPO PRE-SURGERY   COVID TEST- n/a   Anesthesia review: yes cardiac hx, patient is on coumadin   Patient denies shortness of breath, fever, cough and chest pain at PAT appointment   All instructions explained to the patient, with a verbal understanding of the material. Patient agrees to go over the instructions while at home for a better understanding. Patient also instructed to self quarantine after being tested for COVID-19. The opportunity to ask questions was provided.

## 2023-08-23 ENCOUNTER — Telehealth: Payer: Self-pay | Admitting: Cardiology

## 2023-08-23 NOTE — Progress Notes (Addendum)
 Anesthesia Chart Review:  Case: [id number] Date/Time: 08/30/23 0945   Procedure: BIOPSY TEMPORAL ARTERY (Right)   Anesthesia type: General   Diagnosis: Tenderness of temple region [R51.9]   Pre-op diagnosis: Tenderness of temple region   [facility]: [facility] ROOM 15 / [facility] OR   Surgeons: Artice Last, MD       DISCUSSION: Patient is an 83 year old female scheduled for the above procedure. She reported persistent right sided headache with radiation to her temple Temporal artery biopsy recommended.   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 Reported syncope after dental surgery.   She is followed by cardiologist Dr. 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 She has had evaluation in 2011 for longstanding recurrent syncope. Symptoms improved with liberation of salt.  More recent testing included an event monitor in 2015 showing no arrhythmias, a non-ischemic stress test in 2017, 2021 TTE showing LVEF 60-65%, mild LVH, grade 1 DD, normal RV systolic function, trivial MR. She had mild 1-39% BICA stenosis by US  in 12/2022 (monitored at [facility]). At last cardiology visit on 04/18/23 with Lasalle Pointer, NP, HTN stable. LDL 80 and managed by PCP. Follow-up in 6 months planned.   She is scheduled to have [facility] encounter on 08/24/23 to discuss perioperative warfarin instructions.   ADDENDUM 08/24/23 3:58 PM: Preoperative cardiolog telephonic evaluation with Rejeana Card, NP today: " Preoperative Cardiovascular Risk Assessment: - Patient's RCRI score is 0.9%   The  patient affirms she has been doing well without any new cardiac symptoms. They are able to achieve 5 METS without cardiac limitations. Therefore, based on ACC/AHA guidelines, the patient would be at acceptable risk for the planned procedure without further cardiovascular testing. The patient was advised that if she develops new symptoms prior to surgery to contact our office to arrange for a follow-up visit, and she verbalized understanding.    The patient was advised that if she develops new symptoms prior to surgery to contact our office to arrange for a follow-up visit, and she verbalized understanding.   Patient can hold Coumadin  5 days prior to procedure and does not require bridge with Lovenox ".  Anesthesia team to evaluate on the day of surgery.     VS: BP 123/66   Pulse 72   Temp 36.8 C   Resp 16   Ht 5' (1.524 m)   Wt 67.2 kg   SpO2 99%   BMI 28.94 kg/m   PROVIDERS: Bennet Brasil, MD is PCP  Armida Lander, MD is cardiologist Tat, Ivette Marks, DO is neurologist   LABS: Preoperative labs noted. A1c 6.5% on 12/15/22. INR 2.8 on 08/14/23. Sed Rate 72 and CRP 3 on 08/02/23.  (all labs ordered are listed, but only abnormal results are displayed)  Labs Reviewed  GLUCOSE, CAPILLARY - Abnormal; Notable for the following components:      Result Value   Glucose-Capillary 147 (*)    All other components within normal limits  BASIC METABOLIC PANEL WITH GFR - Abnormal; Notable for the following components:   Glucose, Bld 121 (*)    Creatinine, Ser 1.09 (*)    GFR, Estimated 51 (*)    All other components within  normal limits  CBC - Abnormal; Notable for the following components:   Hemoglobin 11.8 (*)    All other components within normal limits     IMAGES: CXR 04/03/23: FINDINGS: The heart size and mediastinal contours are within normal limits. Normal pulmonary vascularity. No focal consolidation, pleural effusion, or pneumothorax. No acute osseous  abnormality. IMPRESSION: 1. No acute cardiopulmonary disease.   MRI Brain 11/18/22: IMPRESSION: 1.  No evidence of an acute intracranial abnormality. 2. Chronic small vessel ischemic changes which are mild-to-moderate in the cerebral white matter, and minimal in the pons. These findings are similar to the prior brain MRI of 01/14/2014. 3. 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   MRI C-spine 09/14/22: IMPRESSION: 1. Multilevel facet arthropathy, which is worst at C4-C5, C5-C6, and C7-T1 where there is periarticular edema, which can be a cause of pain. 2. Severe neural foraminal narrowing on the right at C3-C4. Moderate neural foraminal narrowing on the right at C2-C3. 3. No cervical spinal canal stenosis.    EKG: EKG 04/18/23: Normal sinus rhythm Left axis deviation When compared with ECG of 02-Mar-2017 12:43, PREVIOUS ECG IS PRESENT Confirmed by Lasalle Pointer [phone]) on 04/18/2023 3:24:52 PM   CV: US  Carotid 12/27/22: Summary:  - Right Carotid: Velocities in the right ICA are consistent with a 1-39% stenosis.  - Left Carotid: Velocities in the left ICA are consistent with a 1-39%  stenosis.  - Vertebrals: Bilateral vertebral arteries demonstrate antegrade flow.  - Subclavians: Normal flow hemodynamics were seen in bilateral subclavian arteries.   Echo 08/21/19: IMPRESSIONS   1. Left ventricular ejection fraction, by estimation, is 60 to 65%. The  left ventricle has normal function. Left ventricular endocardial border  not optimally defined to evaluate regional wall motion. There is mild left  ventricular hypertrophy of the  posterior segment. Left ventricular diastolic parameters are consistent  with Grade I diastolic dysfunction (impaired relaxation).   2. Right ventricular systolic function is normal. The right ventricular  size is normal. There is normal pulmonary artery systolic pressure.   3. The mitral valve is degenerative.  Trivial mitral valve regurgitation.   4. The aortic valve is tricuspid. Aortic valve regurgitation is not  visualized. No aortic stenosis is present.  - Comparison(s): Echocardiogram done 07/16/15 showed an EF of 65%.   Nuclear stress test 10/07/15: There was no ST segment deviation noted during stress. The study is normal. There are no perfusion defects consistent with prior infarct or current ischemia. This is a low risk study. The left ventricular ejection fraction is normal (55-65%).  Event monitor 05/2013 (for syncope evaluation): "no arrhythmias"  Past Medical History:  Diagnosis Date   Anxiety    Arthritis    Complication of anesthesia    passed out after dental surgery   Depression    Diabetes mellitus without complication (HCC)    Fibromyalgia    GERD (gastroesophageal reflux disease)    Hyperlipidemia    Hypertension    Palpitations    Tremor, essential 11/30/2015    Past Surgical History:  Procedure Laterality Date   COLONOSCOPY N/A 04/01/2015   Procedure: COLONOSCOPY;  Surgeon: Ruby Corporal, MD;  Location: [facility];  Service: Endoscopy;  Laterality: N/A;  1:45   COLONOSCOPY WITH PROPOFOL  N/A 10/18/2019   Procedure: COLONOSCOPY WITH PROPOFOL ;  Surgeon: Ruby Corporal, MD;  Location: [facility];  Service: Endoscopy;  Laterality: N/A;  730   EMBOLECTOMY Left 12/19/2012   Procedure: EMBOLECTOMY OF LEFT BRACHIAL  ARTERY;  Surgeon: Arvil Lauber, MD;  Location: [facility] OR;  Service: Vascular;  Laterality: Left;   EYE SURGERY Bilateral    cataract   POLYPECTOMY  10/18/2019   Procedure: POLYPECTOMY;  Surgeon: Ruby Corporal, MD;  Location: [facility];  Service: Endoscopy;;    MEDICATIONS:  acetaminophen  (TYLENOL ) 500 MG tablet   albuterol  (VENTOLIN  HFA) 108 (90 Base) MCG/ACT inhaler   blood glucose meter kit and supplies   fluticasone  (FLONASE ) 50 MCG/ACT nasal spray   glucose blood (ONETOUCH VERIO) test strip   Lancets (ONETOUCH DELICA PLUS LANCET33G) MISC    latanoprost  (XALATAN ) 0.005 % ophthalmic solution   LORazepam  (ATIVAN ) 1 MG tablet   metFORMIN  (GLUCOPHAGE -XR) 500 MG 24 hr tablet   metoprolol  succinate (TOPROL -XL) 25 MG 24 hr tablet   oxyCODONE  (ROXICODONE ) 5 MG immediate release tablet   pantoprazole  (PROTONIX ) 40 MG tablet   PARoxetine  (PAXIL ) 20 MG tablet   rosuvastatin  (CRESTOR ) 10 MG tablet   sodium chloride  (OCEAN) 0.65 % SOLN nasal spray   sucralfate  (CARAFATE ) 1 g tablet   warfarin (COUMADIN ) 5 MG tablet   No current facility-administered medications for this encounter.    Ella Gun, PA-C Surgical Short Stay/Anesthesiology [facility] Phone [phone] [facility] Phone [phone] 08/23/2023 2:54 PM

## 2023-08-23 NOTE — Telephone Encounter (Signed)
 Request concerning coumadin  cessation.  The patient has appt on 08/24/2023 on virtual visit. Can you please weigh in?   Jacqueline Morgan

## 2023-08-23 NOTE — Telephone Encounter (Signed)
 Patient wants to speak with RN Edwina Gram regarding her warfarin dosage.

## 2023-08-23 NOTE — Telephone Encounter (Signed)
 Spoke with pt.  She is scheduled for temporal artery Bx 08/30/23.  Wants to know when she should stop warfarin.  Per Epic she has not been cleared by Pharmacy yet.  Message was sent to them again today.  Pt has a phone visit tomorrow for cardiology clearance.  Told her they would advise her tomorrow about holding coumadin .

## 2023-08-23 NOTE — Anesthesia Preprocedure Evaluation (Signed)
 Anesthesia Evaluation  Patient identified by MRN, date of birth, ID band Patient awake    Reviewed: Allergy & Precautions, H&P , NPO status , Patient's Chart, lab work & pertinent test results, reviewed documented beta blocker date and time   History of Anesthesia Complications (+) history of anesthetic complications  Airway Mallampati: II  TM Distance: >3 FB Neck ROM: full    Dental no notable dental hx. (+) Teeth Intact   Pulmonary neg pulmonary ROS   Pulmonary exam normal breath sounds clear to auscultation       Cardiovascular Exercise Tolerance: Good hypertension, Pt. on medications + Peripheral Vascular Disease   Rhythm:regular Rate:Normal     Neuro/Psych  PSYCHIATRIC DISORDERS Anxiety Depression     Neuromuscular disease    GI/Hepatic Neg liver ROS,GERD  Medicated,,  Endo/Other  negative endocrine ROSdiabetes, Type 2    Renal/GU negative Renal ROS  negative genitourinary   Musculoskeletal  (+) Arthritis ,  Fibromyalgia -  Abdominal   Peds  Hematology negative hematology ROS (+)   Anesthesia Other Findings   Reproductive/Obstetrics negative OB ROS                             Anesthesia Physical Anesthesia Plan  ASA: 3  Anesthesia Plan: MAC   Post-op Pain Management: Minimal or no pain anticipated   Induction: Intravenous  PONV Risk Score and Plan: 2 and Propofol  infusion  Airway Management Planned: Mask and Natural Airway  Additional Equipment: None  Intra-op Plan:   Post-operative Plan: Extubation in OR  Informed Consent: I have reviewed the patients History and Physical, chart, labs and discussed the procedure including the risks, benefits and alternatives for the proposed anesthesia with the patient or authorized representative who has indicated his/her understanding and acceptance.     Dental Advisory Given  Plan Discussed with: CRNA and  Anesthesiologist  Anesthesia Plan Comments: (PAT note written 08/23/2023 by Ella Gun, PA-C.  DISCUSSION: Patient is an 83 year old female scheduled for the above procedure. She reported persistent right sided headache with radiation to her temple Temporal artery biopsy recommended.    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 Reported syncope after dental surgery.    She is followed by cardiologist Dr. 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 She has had evaluation in 2011 for longstanding recurrent syncope. Symptoms improved with liberation of salt.  More recent testing included an event monitor in 2015 showing no arrhythmias, a non-ischemic stress test in 2017, 2021 TTE showing LVEF 60-65%, mild LVH, grade 1 DD, normal RV systolic function, trivial MR. She had mild 1-39% BICA stenosis by US  in 12/2022 (monitored at [facility]). At last cardiology visit on 04/18/23 with Lasalle Pointer, NP, HTN stable. LDL 80 and managed by PCP. Follow-up in 6 months planned.    She is scheduled to have [facility] encounter on 08/24/23 to discuss perioperative warfarin instructions.    ADDENDUM 08/24/23 3:58 PM: Preoperative cardiolog telephonic evaluation with Rejeana Card, NP today: " Preoperative Cardiovascular Risk Assessment: - Patient's RCRI score is 0.9%   The patient affirms she has been doing well without any new  cardiac symptoms. They are able to achieve 5 METS without cardiac limitations. Therefore, based on ACC/AHA guidelines, the patient would be at acceptable risk  for the planned procedure without further cardiovascular testing. The patient was advised that if she develops new symptoms prior to surgery to contact our office to arrange for a follow-up visit, and she verbalized understanding.   EKG: EKG 04/18/23: Normal sinus rhythm Left axis deviation When compared with ECG of 02-Mar-2017 12:43, PREVIOUS ECG IS PRESENT Confirmed by Lasalle Pointer [phone]) on 04/18/2023 3:24:52 PM     CV: US  Carotid 12/27/22: Summary:  - Right Carotid: Velocities in the right ICA are consistent with a 1-39% stenosis.  - Left Carotid: Velocities in the left ICA are consistent with a 1-39%  stenosis.  - Vertebrals: Bilateral vertebral arteries demonstrate antegrade flow.  - Subclavians: Normal flow hemodynamics were seen in bilateral subclavian arteries.    Echo 08/21/19: IMPRESSIONS   1. Left ventricular ejection fraction, by estimation, is 60 to 65%. The  left ventricle has normal function. Left ventricular endocardial border  not optimally defined to evaluate regional wall motion. There is mild left  ventricular hypertrophy of the  posterior segment. Left ventricular diastolic parameters are consistent  with Grade I diastolic dysfunction (impaired relaxation).   2. Right ventricular systolic function is normal. The right ventricular  size is normal. There is normal pulmonary artery systolic pressure.   3. The mitral valve is degenerative. Trivial mitral valve regurgitation.   4. The aortic valve is tricuspid. Aortic valve regurgitation is not  visualized. No aortic stenosis is present.  - Comparison(s): Echocardiogram done 07/16/15 showed an EF of 65%.    Nuclear stress test 10/07/15: There was no ST segment deviation noted during stress. The study is normal. There are no perfusion defects consistent with prior infarct or current ischemia. This is a low risk study. The left ventricular ejection fraction is normal (55-65%).   )        Anesthesia Quick  Evaluation

## 2023-08-24 ENCOUNTER — Telehealth (INDEPENDENT_AMBULATORY_CARE_PROVIDER_SITE_OTHER): Payer: Self-pay

## 2023-08-24 ENCOUNTER — Ambulatory Visit: Attending: Cardiology

## 2023-08-24 DIAGNOSIS — Z01810 Encounter for preprocedural cardiovascular examination: Secondary | ICD-10-CM

## 2023-08-24 NOTE — Progress Notes (Signed)
 Per Dr. Larkin Plumb, her office will follow-up with patient at the end of 5/22 to ensure that patient has received instructions for Coumadin .

## 2023-08-24 NOTE — Telephone Encounter (Signed)
 I spoke with patient regarding her instructions for coumadin  from her cardiologist. They have instructed her to hold coumadin  for 5 days prior to surgery.

## 2023-08-24 NOTE — Telephone Encounter (Signed)
 Patient with diagnosis of  RIGHT TEMPORAL ARTERY BIOPSY  on warfarin for anticoagulation.    Procedure:  RIGHT TEMPORAL ARTERY BIOPSY  Date of procedure: TBD  Per office protocol, patient can hold warfarin for 5 days prior to procedure.    Patient will NOT need bridging with Lovenox  (enoxaparin ) around procedure.  **This guidance is not considered finalized until pre-operative APP has relayed final recommendations.**

## 2023-08-24 NOTE — Progress Notes (Signed)
 Virtual Visit via Telephone Note   Because of Jacqueline Morgan co-morbid illnesses, she is at least at moderate risk for complications without adequate follow up.  This format is felt to be most appropriate for this patient at this time.  Due to technical limitations with video connection (technology), today's appointment will be conducted as an audio only telehealth visit, and Clary LYRIKA SOUDERS verbally agreed to proceed in this manner.   All issues noted in this document were discussed and addressed.  No physical exam could be performed with this format.  Evaluation Performed:  Preoperative cardiovascular risk assessment _____________   Date:  08/24/2023   Patient ID:  Jacqueline Morgan, DOB July 23, 1940, MRN [id number] Patient Location:  Home Provider location:   Office  Primary Care Provider:  Bennet Brasil, MD Primary Cardiologist:  Armida Lander, MD  Chief Complaint / Patient Profile   83 y.o. 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  History of Present Illness    Jacqueline Morgan is a 83 y.o. female who presents via audio/video conferencing for a telehealth visit today.  Pt was last seen in cardiology clinic on 04/18/2023 by Lasalle Pointer, NP.  At that time Aretta ANDREY HOOBLER was doing well with stable BP and no acute cardiac changes. The patient is now pending procedure as outlined above. Since her last visit, she has been doing well with no new cardiac complaints.  She is functional and able to complete greater than 4 METS of activity without any difficulty.  She denies chest pain, shortness of breath, lower extremity edema, fatigue, palpitations, melena, hematuria, hemoptysis, diaphoresis, weakness,  presyncope, syncope, orthopnea, and PND.   Past Medical History    Past Medical History:  Diagnosis Date   Anxiety    Arthritis    Complication of anesthesia    passed out after dental surgery   Depression    Diabetes mellitus without complication (HCC)    Fibromyalgia    GERD (gastroesophageal reflux disease)    Hyperlipidemia    Hypertension    Palpitations    Tremor, essential 11/30/2015   Past Surgical History:  Procedure Laterality Date   COLONOSCOPY N/A 04/01/2015   Procedure: COLONOSCOPY;  Surgeon: Ruby Corporal, MD;  [facility];  Service: Endoscopy;  Laterality: N/A;  1:45   COLONOSCOPY WITH PROPOFOL  N/A 10/18/2019   Procedure: COLONOSCOPY WITH PROPOFOL ;  Surgeon: Ruby Corporal, MD;  Location: [facility];  Service: Endoscopy;  Laterality: N/A;  730   EMBOLECTOMY Left 12/19/2012   Procedure: EMBOLECTOMY OF LEFT BRACHIAL ARTERY;  Surgeon: Arvil Lauber, MD;  Location[facility];  Service: Vascular;  Laterality: Left;   EYE SURGERY Bilateral    cataract   POLYPECTOMY  10/18/2019   Procedure: POLYPECTOMY;  Surgeon: Ruby Corporal, MD;  Location: [facility];  Service: Endoscopy;;    Allergies  Allergies  Allergen Reactions   Codeine     REACTION: Adverse gastrointestinal effects   Statins     Felt bad    Home Medications    Prior to Admission medications   Medication Sig Start Date End Date Taking? Authorizing Provider  acetaminophen  (TYLENOL ) 500 MG tablet Take 1,000 mg by mouth every 8 (eight) hours as needed for moderate  pain (pain score 4-6). For headache/pain    [provider]  albuterol  (VENTOLIN  HFA) 108 (90 Base) MCG/ACT inhaler Inhale 1-2 puffs into the lungs every 6 (six) hours as needed for wheezing or shortness of breath. 04/03/23   Gregoria Leas, NP  blood glucose meter kit and supplies Dispense based on patient and insurance preference. Use to check sugars once per day. (FOR ICD-10 E10.9, E11.9). 02/05/21   Bennet Brasil, MD  fluticasone  (FLONASE ) 50 MCG/ACT nasal spray Place 2 sprays into both nostrils daily. 12/21/21   Bennet Brasil, MD  glucose blood [facility] VERIO) test strip USE TO CHECK SUGAR ONCE DAILY DX CODE R73.03, E11.69, E78.5 05/18/21   Bennet Brasil, MD  Lancets [facility] DELICA PLUS [place]) MISC USE TO CHECK SUGAR ONCE DAILY DX CODE R73.03, E11.69, E78.5 05/18/21   Bennet Brasil, MD  latanoprost  (XALATAN ) 0.005 % ophthalmic solution Place 1 drop into both eyes at bedtime. 05/18/23   [provider]  LORazepam  (ATIVAN ) 1 MG tablet TAKE (1/2) TABLET DURING THE DAY AND (1) TABLET AT BEDTIME AS NEEDED. 01/30/23   Bennet Brasil, MD  metFORMIN  (GLUCOPHAGE -XR) 500 MG 24 hr tablet TAKE 1 TABLET BY MOUTH DAILY  WITH BREAKFAST 12/07/22   Bennet Brasil, MD  metoprolol  succinate (TOPROL -XL) 25 MG 24 hr tablet TAKE 1 TABLET BY MOUTH ONCE  DAILY 10/11/22   Bennet Brasil, MD  oxyCODONE  (ROXICODONE ) 5 MG immediate release tablet Take 1 tablet (5 mg total) by mouth every 4 (four) hours as needed for severe pain (pain score 7-10). 07/16/23   Mordecai Applebaum, MD  pantoprazole  (PROTONIX ) 40 MG tablet TAKE 1 TABLET BY MOUTH TWICE  DAILY 10/11/22   Bennet Brasil, MD  PARoxetine  (PAXIL ) 20 MG tablet TAKE 1 TABLET BY MOUTH IN THE  MORNING AND 2 TABLETS BY MOUTH  IN THE EVENING 10/11/22   Luking, Jackelyn Marvel, MD  rosuvastatin  (CRESTOR ) 10 MG tablet TAKE 1 TABLET BY MOUTH DAILY 10/11/22   Bennet Brasil, MD  sodium chloride  (OCEAN) 0.65 % SOLN nasal spray Place 1 spray into both nostrils as needed. 07/20/23   Soldatova, Liuba, MD  sucralfate  (CARAFATE ) 1 g tablet TAKE 1 TABLET BY MOUTH BEFORE MEALS AND AT BEDTIME. Patient taking differently: Take 1 g by mouth daily as needed (ulcers). 06/30/22   Bennet Brasil, MD  warfarin (COUMADIN ) 5 MG tablet TAKE 1/2 -1 TABLET BY MOUTH DAILY AS DIRECTED. Patient taking differently: Take 2.5-5 mg by mouth See admin instructions. Take 5 mg by mouth on Monday and Friday  and take 2.5 mg on Tuesday-Thursday, Saturday and Sunday 03/13/23   Laurann Pollock, MD    Physical Exam    Vital Signs:  Saanvika SAPHRONIA OZDEMIR does not have vital signs available for review today.  Given telephonic nature of communication, physical exam is limited. AAOx3. NAD. Normal affect.  Speech and respirations are unlabored.  Accessory Clinical Findings    None  Assessment & Plan    1.  Preoperative Cardiovascular Risk Assessment: - Patient's RCRI score is 0.9%  The patient affirms she has been doing well without any new cardiac symptoms. They are able to achieve 5 METS without cardiac limitations. Therefore, based on ACC/AHA guidelines, the patient would be at acceptable risk for the planned procedure without further cardiovascular testing. The patient was advised that if she develops new symptoms prior to surgery to contact our office to arrange for a follow-up visit, and she  verbalized understanding.   The patient was advised that if she develops new symptoms prior to surgery to contact our office to arrange for a follow-up visit, and she verbalized understanding.  Patient can hold Coumadin  5 days prior to procedure and does not require bridge with Lovenox   A copy of this note will be routed to requesting surgeon.  Time:   Today, I have spent 6 minutes with the patient with telehealth technology discussing medical history, symptoms, and management plan.     Francene Ing, Retha Cast, NP  08/24/2023, 7:35 AM

## 2023-08-25 ENCOUNTER — Encounter: Payer: Self-pay | Admitting: Family Medicine

## 2023-08-25 ENCOUNTER — Other Ambulatory Visit: Payer: Self-pay | Admitting: Family Medicine

## 2023-08-30 ENCOUNTER — Encounter (HOSPITAL_COMMUNITY)
Admission: RE | Disposition: A | Discharge status: Home / Self Care | Payer: Self-pay | Source: Home / Self Care | Attending: Otolaryngology

## 2023-08-30 ENCOUNTER — Ambulatory Visit (HOSPITAL_COMMUNITY): Payer: Self-pay | Admitting: Vascular Surgery

## 2023-08-30 ENCOUNTER — Other Ambulatory Visit: Payer: Self-pay

## 2023-08-30 ENCOUNTER — Ambulatory Visit (HOSPITAL_COMMUNITY): Payer: Self-pay | Admitting: Certified Registered Nurse Anesthetist

## 2023-08-30 ENCOUNTER — Ambulatory Visit (HOSPITAL_COMMUNITY)
Admission: RE | Admit: 2023-08-30 | Discharge: 2023-08-30 | Disposition: A | Discharge status: Home / Self Care | Attending: Otolaryngology | Admitting: Otolaryngology

## 2023-08-30 ENCOUNTER — Encounter (HOSPITAL_COMMUNITY): Payer: Self-pay

## 2023-08-30 DIAGNOSIS — J342 Deviated nasal septum: Secondary | ICD-10-CM | POA: Diagnosis not present

## 2023-08-30 DIAGNOSIS — E119 Type 2 diabetes mellitus without complications: Secondary | ICD-10-CM | POA: Insufficient documentation

## 2023-08-30 DIAGNOSIS — K219 Gastro-esophageal reflux disease without esophagitis: Secondary | ICD-10-CM | POA: Diagnosis not present

## 2023-08-30 DIAGNOSIS — Z7901 Long term (current) use of anticoagulants: Secondary | ICD-10-CM | POA: Diagnosis not present

## 2023-08-30 DIAGNOSIS — G501 Atypical facial pain: Secondary | ICD-10-CM

## 2023-08-30 DIAGNOSIS — R519 Headache, unspecified: Secondary | ICD-10-CM

## 2023-08-30 DIAGNOSIS — E1151 Type 2 diabetes mellitus with diabetic peripheral angiopathy without gangrene: Secondary | ICD-10-CM

## 2023-08-30 DIAGNOSIS — Z79899 Other long term (current) drug therapy: Secondary | ICD-10-CM | POA: Diagnosis not present

## 2023-08-30 DIAGNOSIS — I10 Essential (primary) hypertension: Secondary | ICD-10-CM | POA: Diagnosis not present

## 2023-08-30 DIAGNOSIS — I708 Atherosclerosis of other arteries: Secondary | ICD-10-CM | POA: Diagnosis not present

## 2023-08-30 DIAGNOSIS — I6782 Cerebral ischemia: Secondary | ICD-10-CM | POA: Diagnosis not present

## 2023-08-30 DIAGNOSIS — R040 Epistaxis: Secondary | ICD-10-CM | POA: Insufficient documentation

## 2023-08-30 DIAGNOSIS — G8929 Other chronic pain: Secondary | ICD-10-CM

## 2023-08-30 HISTORY — PX: ARTERY BIOPSY: SHX891

## 2023-08-30 LAB — GLUCOSE, CAPILLARY
Glucose-Capillary: 77 mg/dL (ref 70–99)
Glucose-Capillary: 93 mg/dL (ref 70–99)
Glucose-Capillary: 105 mg/dL — ABNORMAL HIGH (ref 70–99)

## 2023-08-30 LAB — PROTIME-INR
INR: 2.7 — ABNORMAL HIGH (ref 0.8–1.2)
Prothrombin Time: 29 s — ABNORMAL HIGH (ref 11.4–15.2)

## 2023-08-30 SURGERY — BIOPSY TEMPORAL ARTERY
Anesthesia: Monitor Anesthesia Care | Site: Head | Laterality: Right

## 2023-08-30 MED ORDER — CEFAZOLIN SODIUM-DEXTROSE 2-4 GM/100ML-% IV SOLN
2.0000 g | Freq: Once | INTRAVENOUS | Status: AC
  Administered 2023-08-30: 2 g via INTRAVENOUS
  Filled 2023-08-30: qty 100

## 2023-08-30 MED ORDER — LIDOCAINE-EPINEPHRINE 1 %-1:100000 IJ SOLN
INTRAMUSCULAR | Status: DC | PRN
Start: 2023-08-30 — End: 2023-08-30
  Administered 2023-08-30: 2 mL

## 2023-08-30 MED ORDER — MEPERIDINE HCL 25 MG/ML IJ SOLN: 6.2500 mg | INTRAMUSCULAR | Status: DC | PRN

## 2023-08-30 MED ORDER — PHENYLEPHRINE 80 MCG/ML (10ML) SYRINGE FOR IV PUSH (FOR BLOOD PRESSURE SUPPORT)
PREFILLED_SYRINGE | INTRAVENOUS | Status: AC
  Filled 2023-08-30: qty 10

## 2023-08-30 MED ORDER — SODIUM CHLORIDE 0.9% FLUSH: 3.0000 mL | INTRAVENOUS | Status: DC | PRN

## 2023-08-30 MED ORDER — PROPOFOL 500 MG/50ML IV EMUL
INTRAVENOUS | Status: DC | PRN
  Administered 2023-08-30: 30 mg via INTRAVENOUS
  Administered 2023-08-30: 100 ug/kg/min via INTRAVENOUS
  Administered 2023-08-30 (×2): 10 mg via INTRAVENOUS
  Administered 2023-08-30: 20 mg via INTRAVENOUS

## 2023-08-30 MED ORDER — SODIUM CHLORIDE 0.9% FLUSH: 3.0000 mL | Freq: Two times a day (BID) | INTRAVENOUS | Status: DC

## 2023-08-30 MED ORDER — EPHEDRINE 5 MG/ML INJ
INTRAVENOUS | Status: AC
Start: 2023-08-30 — End: ?
  Filled 2023-08-30: qty 5

## 2023-08-30 MED ORDER — LIDOCAINE 2% (20 MG/ML) 5 ML SYRINGE
INTRAMUSCULAR | Status: DC | PRN
  Administered 2023-08-30: 60 mg via INTRAVENOUS

## 2023-08-30 MED ORDER — ONDANSETRON HCL 4 MG/2ML IJ SOLN
INTRAMUSCULAR | Status: AC
  Filled 2023-08-30: qty 2

## 2023-08-30 MED ORDER — FENTANYL CITRATE (PF) 250 MCG/5ML IJ SOLN
INTRAMUSCULAR | Status: AC
Start: 2023-08-30 — End: ?
  Filled 2023-08-30: qty 5

## 2023-08-30 MED ORDER — AMOXICILLIN-POT CLAVULANATE 500-125 MG PO TABS: 1.0000 | ORAL_TABLET | Freq: Two times a day (BID) | ORAL | 0 refills | Status: DC

## 2023-08-30 MED ORDER — OXYCODONE HCL 5 MG PO TABS: 5.0000 mg | ORAL_TABLET | ORAL | 0 refills | Status: DC | PRN

## 2023-08-30 MED ORDER — ORAL CARE MOUTH RINSE: 15.0000 mL | Freq: Once | OROMUCOSAL | Status: AC

## 2023-08-30 MED ORDER — LIDOCAINE-EPINEPHRINE 1 %-1:100000 IJ SOLN
INTRAMUSCULAR | Status: AC
  Filled 2023-08-30: qty 1

## 2023-08-30 MED ORDER — PROPOFOL 10 MG/ML IV BOLUS
INTRAVENOUS | Status: AC
  Filled 2023-08-30: qty 20

## 2023-08-30 MED ORDER — OXYCODONE HCL 5 MG/5ML PO SOLN: 5.0000 mg | Freq: Once | ORAL | Status: DC | PRN

## 2023-08-30 MED ORDER — ACETAMINOPHEN 500 MG PO TABS: 1000.0000 mg | ORAL_TABLET | Freq: Three times a day (TID) | ORAL | 0 refills | Status: AC | PRN

## 2023-08-30 MED ORDER — PHENYLEPHRINE HCL (PRESSORS) 10 MG/ML IV SOLN
INTRAVENOUS | Status: DC | PRN
  Administered 2023-08-30: 80 ug via INTRAVENOUS
  Administered 2023-08-30 (×2): 40 ug via INTRAVENOUS

## 2023-08-30 MED ORDER — OXYCODONE HCL 5 MG PO TABS: 5.0000 mg | ORAL_TABLET | Freq: Once | ORAL | Status: DC | PRN

## 2023-08-30 MED ORDER — CHLORHEXIDINE GLUCONATE 0.12 % MT SOLN
15.0000 mL | Freq: Once | OROMUCOSAL | Status: AC
  Administered 2023-08-30: 15 mL via OROMUCOSAL
  Filled 2023-08-30: qty 15

## 2023-08-30 MED ORDER — INSULIN ASPART 100 UNIT/ML IJ SOLN: 0.0000 [IU] | INTRAMUSCULAR | Status: DC | PRN

## 2023-08-30 MED ORDER — CHLORHEXIDINE GLUCONATE 0.12 % MT SOLN: 15.0000 mL | Freq: Once | OROMUCOSAL | Status: AC

## 2023-08-30 MED ORDER — FENTANYL CITRATE (PF) 100 MCG/2ML IJ SOLN: 25.0000 ug | INTRAMUSCULAR | Status: DC | PRN

## 2023-08-30 MED ORDER — LIDOCAINE 2% (20 MG/ML) 5 ML SYRINGE
INTRAMUSCULAR | Status: AC
  Filled 2023-08-30: qty 5

## 2023-08-30 MED ORDER — METOPROLOL SUCCINATE ER 25 MG PO TB24
25.0000 mg | ORAL_TABLET | Freq: Once | ORAL | Status: AC
  Administered 2023-08-30: 25 mg via ORAL
  Filled 2023-08-30: qty 1

## 2023-08-30 MED ORDER — ONDANSETRON HCL 4 MG/2ML IJ SOLN: 4.0000 mg | Freq: Once | INTRAMUSCULAR | Status: DC | PRN

## 2023-08-30 MED ORDER — DEXAMETHASONE SODIUM PHOSPHATE 10 MG/ML IJ SOLN
INTRAMUSCULAR | Status: AC
  Filled 2023-08-30: qty 1

## 2023-08-30 MED ORDER — EPHEDRINE SULFATE-NACL 50-0.9 MG/10ML-% IV SOSY
PREFILLED_SYRINGE | INTRAVENOUS | Status: DC | PRN
  Administered 2023-08-30 (×2): 5 mg via INTRAVENOUS

## 2023-08-30 MED ORDER — LACTATED RINGERS IV SOLN: INTRAVENOUS | Status: DC

## 2023-08-30 SURGICAL SUPPLY — 30 items
BLADE SURG 15 STRL LF DISP TIS (BLADE) IMPLANT
CANISTER SUCTION 3000ML PPV (SUCTIONS) IMPLANT
CLEANER TIP ELECTROSURG 2X2 (MISCELLANEOUS) ×1 IMPLANT
CNTNR URN SCR LID CUP LEK RST (MISCELLANEOUS) ×1 IMPLANT
CORD BIPOLAR FORCEPS 12FT (ELECTRODE) IMPLANT
COVER SURGICAL LIGHT HANDLE (MISCELLANEOUS) ×1 IMPLANT
DERMABOND ADVANCED .7 DNX12 (GAUZE/BANDAGES/DRESSINGS) IMPLANT
DRAPE HALF SHEET 40X57 (DRAPES) IMPLANT
ELECT COATED BLADE 2.86 ST (ELECTRODE) ×1 IMPLANT
ELECTRODE REM PT RTRN 9FT ADLT (ELECTROSURGICAL) ×1 IMPLANT
GAUZE SPONGE 4X4 12PLY STRL (GAUZE/BANDAGES/DRESSINGS) IMPLANT
GLOVE BIO SURGEON STRL SZ 6 (GLOVE) ×1 IMPLANT
GLOVE BIO SURGEON STRL SZ7.5 (GLOVE) ×1 IMPLANT
GOWN STRL REUS W/ TWL LRG LVL3 (GOWN DISPOSABLE) ×2 IMPLANT
KIT BASIN OR (CUSTOM PROCEDURE TRAY) ×1 IMPLANT
KIT TURNOVER KIT B (KITS) ×1 IMPLANT
NDL 25GX 5/8IN NON SAFETY (NEEDLE) ×1 IMPLANT
NEEDLE 25GX 5/8IN NON SAFETY (NEEDLE) ×1 IMPLANT
NS IRRIG 1000ML POUR BTL (IV SOLUTION) ×1 IMPLANT
PAD ARMBOARD POSITIONER FOAM (MISCELLANEOUS) ×2 IMPLANT
PENCIL FOOT CONTROL (ELECTRODE) ×1 IMPLANT
SUT MON AB 4-0 PC3 18 (SUTURE) IMPLANT
SUT SILK 2 0 SH (SUTURE) IMPLANT
SUT SILK 2-0 18XBRD TIE 12 (SUTURE) IMPLANT
SUT SILK 4 0 REEL (SUTURE) ×1 IMPLANT
SUT VIC AB 3-0 X1 27 (SUTURE) IMPLANT
TOWEL GREEN STERILE FF (TOWEL DISPOSABLE) ×1 IMPLANT
TRAY ENT MC OR (CUSTOM PROCEDURE TRAY) ×1 IMPLANT
WATER STERILE IRR 1000ML POUR (IV SOLUTION) ×1 IMPLANT
YANKAUER SUCT BULB TIP NO VENT (SUCTIONS) IMPLANT

## 2023-08-30 NOTE — Progress Notes (Signed)
 PT-INR came back elevated. Dr. Barbaraann Levo is aware.

## 2023-08-30 NOTE — Op Note (Signed)
 OPERATIVE REPORT  PREOPERATIVE DIAGNOSES: 1. 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 Right temporal artery biopsy   SURGEON:  Artice Last, MD  ASSISTANT: None  ANESTHESIA: MAC  BLOOD LOSS: Minimal.  COMPLICATIONS: None.  CONDITION: Stable to PACU.  INDICATIONS AND CONSENT: Jacqueline Morgan is a 83 y.o. who presented in consultation to our Otolaryngology clinic with concerns for temporal arteritis. The above procedure was offered to our patient. The patient agreed to proceed after discussion of the risks, benefits, complications, alternatives and the standard informed consent forms were obtained preoperatively.  DESCRIPTION OF PROCEDURE: Jacqueline Morgan was brought to the operating suite on 08/30/2023 after being properly identified in the preoperative area. They were transferred to the operating table and placed in the supine position. After gentle induction of sedation was performed by the Anesthesia team, the head of the bed was rotated 90 degrees. A surgeon initiated time out was performed. The patient was prepped and draped and the course of a right superficial temporal artery was identified using palpation and a hand-held Doppler.  A local anesthesia administered in the form of 1% lidocaine  with 1:100,000 parts epinephrine  around the course of the right superficial temporal artery. Once there was adequate time for the anesthetic and vasoconstrictive agents to work, a 15 blade scalpel was used to make an incision along the course of the vessel in the right temporal region just behind the hairline. Dissection was then carried out to the superficial layer of the deep temporal fascia.  Gentle dissection was carried out once the vessel was identified through the fascial layer.  We then identified and clamped any tributaries and tied off the proximal and  distal aspect of the vessel that was intended for biopsy using 3-0 silk sutures.  We then divided the proximal the distal end of the superficial temporal artery, and the specimen was submitted for permanent histopathology.  The wound was then irrigated and hemostasis was achieved using bipolar cautery.  The incision was then closed using 4-0 Vicryl for deep sutures and Monocryl to approximate skin.  Dermabond was applied along the incision.  This completed the procedure. The patient tolerated the procedure well without any apparent complications. All counts were correct x2 at the conclusion of the procedure. The patient was turned over to Anesthesia, and transported to the PACU in stable condition. I was present and actively participated in the entire case.

## 2023-08-30 NOTE — Transfer of Care (Signed)
 Immediate Anesthesia Transfer of Care Note  Patient: Jacqueline Morgan  Procedure(s) Performed: BIOPSY TEMPORAL ARTERY (Right)  Patient Location: PACU  Anesthesia Type:MAC  Level of Consciousness: awake, alert , and oriented  Airway & Oxygen Therapy: Patient Spontanous Breathing and Patient connected to face mask oxygen  Post-op Assessment: Report given to RN, Post -op Vital signs reviewed and stable, Patient moving all extremities X 4, and Patient able to stick tongue midline  Post vital signs: Reviewed and stable  Last Vitals:  Vitals Value Taken Time  BP 163/70 08/30/23 1135  Temp 98.6   Pulse 75 08/30/23 1136  Resp 18 08/30/23 1136  SpO2 96 % 08/30/23 1136  Vitals shown include unfiled device data.  Last Pain:  Vitals:   08/30/23 0823  TempSrc:   PainSc: 0-No pain         Complications: No notable events documented.

## 2023-08-30 NOTE — Interval H&P Note (Signed)
 History and Physical Interval Note:  08/30/2023 9:37 AM  Jacqueline Morgan  has presented today for surgery, with the diagnosis of Tenderness of temple region.  The various methods of treatment have been discussed with the patient and family. After consideration of risks, benefits and other options for treatment, the patient has consented to  Procedure(s): BIOPSY TEMPORAL ARTERY (Right) as a surgical intervention.  The patient's history has been reviewed, patient examined, no change in status, stable for surgery.  I have reviewed the patient's chart and labs.  Questions were answered to the patient's satisfaction.     Ivania Teagarden

## 2023-08-30 NOTE — Anesthesia Postprocedure Evaluation (Signed)
 Anesthesia Post Note  Patient: Jacqueline Morgan  Procedure(s) Performed: BIOPSY TEMPORAL ARTERY (Right)     Patient location during evaluation: PACU Anesthesia Type: MAC Level of consciousness: awake and alert Pain management: pain level controlled Vital Signs Assessment: post-procedure vital signs reviewed and stable Respiratory status: spontaneous breathing, nonlabored ventilation, respiratory function stable and patient connected to nasal cannula oxygen Cardiovascular status: stable and blood pressure returned to baseline Postop Assessment: no apparent nausea or vomiting Anesthetic complications: no   No notable events documented.  Last Vitals:  Vitals:   08/30/23 1200 08/30/23 1215  BP: (!) 147/75 (!) 158/71  Pulse: 68 68  Resp: 12 16  Temp:  36.4 C  SpO2: 96% 100%    Last Pain:  Vitals:   08/30/23 1215  TempSrc:   PainSc: 0-No pain                 Cristobal Advani

## 2023-08-30 NOTE — Discharge Instructions (Addendum)
 After the procedure  The artery will take several days to completely heal from the procedure.  During this time, please avoid vigorous or strenuous activity, including  sports and heavy lifting, to allow the wound to heal properly. You may experience some discomfort after the procedure. An ice pack is  probably the most effective method of dealing with pain from this  operation. An ice pack will help with the pain and prevent some of the  swelling.  Do not put ice directly on the skin. Wrap the ice pack (or ice in a zip lock  bag) in a hand towel. Hold it on the wound for 10-15 minutes at a time. You can bathe. Avoid washing your hair for 2 weeks after surgery. If you  wish, you can wash your hair carefully, taking extra care not to wet the  wound or dressing.  Do not drive yourself to any follow-up appointments. If possible, have  somebody accompany you.  Travelling abroad It is advisable to discuss any travel plans with your surgeon  You can resume all of your home medications after this procedure including blood thinner.  Take Tylenol  for pain and take antibiotic to prevent infection following this procedure.

## 2023-08-31 ENCOUNTER — Encounter (HOSPITAL_COMMUNITY): Payer: Self-pay | Admitting: Otolaryngology

## 2023-08-31 ENCOUNTER — Ambulatory Visit (INDEPENDENT_AMBULATORY_CARE_PROVIDER_SITE_OTHER): Payer: Self-pay

## 2023-08-31 LAB — SURGICAL PATHOLOGY

## 2023-08-31 NOTE — Telephone Encounter (Signed)
 Spoke with patient's daughter about her mother's results. She understood and said that they would be back for their follow-up appointment.

## 2023-09-02 ENCOUNTER — Encounter (INDEPENDENT_AMBULATORY_CARE_PROVIDER_SITE_OTHER): Payer: Self-pay | Admitting: Otolaryngology

## 2023-09-06 ENCOUNTER — Ambulatory Visit: Admitting: Family Medicine

## 2023-09-06 VITALS — BP 89/58 | Ht 60.0 in | Wt 147.4 lb

## 2023-09-06 DIAGNOSIS — R110 Nausea: Secondary | ICD-10-CM

## 2023-09-06 DIAGNOSIS — R42 Dizziness and giddiness: Secondary | ICD-10-CM

## 2023-09-06 DIAGNOSIS — G250 Essential tremor: Secondary | ICD-10-CM

## 2023-09-06 NOTE — Progress Notes (Signed)
   Subjective:    Patient ID: Jacqueline Morgan, female    DOB: 1940-07-05, 83 y.o.   MRN: [id number]  HPI  Patient arrives for a follow up on dizziness- Patient states she is still dizzy Discussed the use of AI scribe software for clinical note transcription with the patient, who gave verbal consent to proceed.  History of Present Illness   Jacqueline Morgan is an 83 year old female who presents with lightheadedness and difficulty with fluid intake.  She experiences lightheadedness, particularly when standing, described as a 'woozy' sensation. This has been ongoing without associated pain. Her blood pressure is noted to be lower than usual, which may be related to inadequate fluid intake.  Her dietary intake is suboptimal, and she struggles with fluid intake. She typically consumes a 16-ounce bottle of water  and a 12 to 16-ounce Dr. Kathlene Paradise daily. Water  intake is limited due to nausea, and her urine color varies from light to dark yellow, indicating inconsistent hydration.  She recently visited an ear, nose, and throat doctor and underwent a test, which returned normal results. She has not experienced pain from this issue and has been able to drive for the first time since the test.  She experiences persistent headaches and neck stiffness, which have been present for the past few days. She has not consistently used warm compresses for relief.  Her blood work from the previous week included kidney function, sodium, potassium, and blood sugar levels. Her blood sugar typically ranges from 80 to 96, and her A1c was 6.0 in April. She takes metformin  for her blood sugar management and plans to switch her medication provider from [facility] to [facility] due to insurance changes.  No significant pain related to her recent ENT visit. She reports persistent headaches and neck stiffness. She experiences nausea with water  intake, which limits her fluid consumption.      Patient has severe tremors and anxiety.   Medication.  Does not eat or drink much because of persistent nausea - Her urine tends to be yellow concentrated Review of Systems     Objective:   Physical Exam General-in no acute distress Eyes-no discharge Lungs-respiratory rate normal, CTA CV-no murmurs,RRR Extremities skin warm dry no edema Neuro grossly normal Behavior normal, alert Patient with a moderate amount of lab work over the past 2 months reviewed with the patient She is states her temporal headache is doing better and gone biopsy for temporal arteritis was normal       Assessment & Plan:  Nausea-will discuss with the patient further on follow-up discussion her interest in seeing gastroenterology for further workup of this it could be all related to her nerves but it does seem reasonable to see gastroenterologist-we can also do nausea medicine We will reach out to her regarding this  Significant anxiety tremors continue current measures

## 2023-09-07 ENCOUNTER — Other Ambulatory Visit: Payer: Self-pay

## 2023-09-07 ENCOUNTER — Telehealth: Payer: Self-pay | Admitting: Family Medicine

## 2023-09-07 DIAGNOSIS — R110 Nausea: Secondary | ICD-10-CM

## 2023-09-07 MED ORDER — ONDANSETRON HCL 8 MG PO TABS: 8.0000 mg | ORAL_TABLET | Freq: Three times a day (TID) | ORAL | 2 refills | Status: DC | PRN

## 2023-09-07 NOTE — Telephone Encounter (Signed)
 Spoke with patient and informed according to drs recommendations, pt did want the medication to be sent to [facility] as well as the referral to GI, orders have been placed.

## 2023-09-07 NOTE — Telephone Encounter (Signed)
 Nurses Pt c/o nausea at OV Plz call her Offer her 1- we can send in zofran  for nausea if she desires- Zofran  8 mg one tid prn nausea, # 15 with 2 RF  2- we can refer to GI to get their input if she desires  Thanks Dr Geralyn Knee

## 2023-09-08 ENCOUNTER — Ambulatory Visit (INDEPENDENT_AMBULATORY_CARE_PROVIDER_SITE_OTHER): Admitting: Audiology

## 2023-09-08 DIAGNOSIS — H903 Sensorineural hearing loss, bilateral: Secondary | ICD-10-CM | POA: Diagnosis not present

## 2023-09-08 NOTE — Progress Notes (Signed)
  [address] [place], [place] [address] [phone]  Audiological Evaluation    Name: Jacqueline Morgan     DOB:   09/05/1940      MRN:   [id number]                                                                                     Service Date: 09/08/2023     Accompanied by: unaccompanied to the booth   Patient comes today after Dr. Soldatova, ENT sent a referral for a hearing evaluation due to concerns with right ear pain and decreased hearing.   Symptoms Yes Details  Hearing loss  [x]  Perceives right to be worse than left  Tinnitus  [x]  Right ear tinnitus worse than left  Ear pain/ infections/pressure  [x]  Right ear pain, pain is also reported to be around her right side her head.  Balance problems  [x]  Stumbles when she walks in a room, unclear if spinning, says she wakes up everyday nauseous and is dizzy in her head" all day and every day.   Noise exposure history  []    Previous ear surgeries  []    Family history of hearing loss  []    Amplification  []    Other  []      Otoscopy: Right ear: Clear external ear canal and notable landmarks visualized on the tympanic membrane. Left ear:  Clear external ear canal and notable landmarks visualized on the tympanic membrane.  Tympanometry: Right ear: Type A- Normal external ear canal volume with normal middle ear pressure and tympanic membrane compliance. Left ear: Type A- Normal external ear canal volume with normal middle ear pressure and tympanic membrane compliance.    Pure tone Audiometry: Right ear- Normal to severe sensorineural hearing loss from 250 Hz - 8000 Hz. Left ear-  Normal to moderate sensorineural hearing loss from 250 Hz - 8000 Hz.  Slight difference noted, worse in the left ear.  Speech Audiometry: Right ear- Speech Reception Threshold (SRT) was obtained at 20 dBHL. Left ear-Speech Reception Threshold (SRT) was obtained at 20 dBHL.   Word Recognition Score Tested using NU-6 (recorded) Right  ear: 96% was obtained at a presentation level of 80 dBHL with contralateral masking which is deemed as  excellent. Left ear: 92% was obtained at a presentation level of 80 dBHL with contralateral masking which is deemed as  excellent.   The hearing test results were completed under headphones and re-checked with inserts and results are deemed to be of good to fair reliability. Test technique:  conventional      Recommendations: Follow up with ENT as scheduled for today. Return for a hearing evaluation if concerns with hearing changes arise or per MD recommendation. Consider a communication needs assessment after medical clearance for hearing aids is obtained.   Ross Bender MARIE LEROUX-MARTINEZ, AUD

## 2023-09-10 ENCOUNTER — Encounter: Payer: Self-pay | Admitting: Family Medicine

## 2023-09-11 ENCOUNTER — Encounter: Payer: Self-pay | Admitting: Internal Medicine

## 2023-09-12 ENCOUNTER — Encounter: Payer: Self-pay | Admitting: Family Medicine

## 2023-09-13 ENCOUNTER — Encounter (INDEPENDENT_AMBULATORY_CARE_PROVIDER_SITE_OTHER): Payer: Self-pay | Admitting: Otolaryngology

## 2023-09-13 ENCOUNTER — Ambulatory Visit (INDEPENDENT_AMBULATORY_CARE_PROVIDER_SITE_OTHER): Admitting: Otolaryngology

## 2023-09-13 ENCOUNTER — Other Ambulatory Visit: Payer: Self-pay

## 2023-09-13 VITALS — BP 151/79

## 2023-09-13 DIAGNOSIS — R2689 Other abnormalities of gait and mobility: Secondary | ICD-10-CM

## 2023-09-13 DIAGNOSIS — H9190 Unspecified hearing loss, unspecified ear: Secondary | ICD-10-CM | POA: Diagnosis not present

## 2023-09-13 DIAGNOSIS — R42 Dizziness and giddiness: Secondary | ICD-10-CM

## 2023-09-13 DIAGNOSIS — E1169 Type 2 diabetes mellitus with other specified complication: Secondary | ICD-10-CM

## 2023-09-13 DIAGNOSIS — R251 Tremor, unspecified: Secondary | ICD-10-CM

## 2023-09-13 DIAGNOSIS — H903 Sensorineural hearing loss, bilateral: Secondary | ICD-10-CM

## 2023-09-13 DIAGNOSIS — R519 Headache, unspecified: Secondary | ICD-10-CM

## 2023-09-13 DIAGNOSIS — M542 Cervicalgia: Secondary | ICD-10-CM

## 2023-09-13 MED ORDER — PANTOPRAZOLE SODIUM 40 MG PO TBEC: 40.0000 mg | DELAYED_RELEASE_TABLET | Freq: Two times a day (BID) | ORAL | 3 refills | Status: AC

## 2023-09-13 MED ORDER — ROSUVASTATIN CALCIUM 10 MG PO TABS: 10.0000 mg | ORAL_TABLET | Freq: Every day | ORAL | 3 refills | Status: AC

## 2023-09-13 MED ORDER — PAROXETINE HCL 20 MG PO TABS: ORAL_TABLET | ORAL | 3 refills | Status: AC

## 2023-09-13 MED ORDER — METFORMIN HCL ER 500 MG PO TB24: 500.0000 mg | ORAL_TABLET | Freq: Every day | ORAL | 3 refills | Status: AC

## 2023-09-13 MED ORDER — METOPROLOL SUCCINATE ER 25 MG PO TB24: ORAL_TABLET | ORAL | 3 refills | Status: DC

## 2023-09-13 NOTE — Progress Notes (Signed)
 ENT Progress Note:  Update 09/13/23  Jacqueline Morgan is an 83 year old female who presents for follow-up after a biopsy and hearing test.  She recently underwent a biopsy of right temporal artery which returned negative results. She notes persistent crusting at the biopsy site.  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 The hearing loss was symmetric. She prefers to delay the use of hearing aids at this time.  She has chronic neck issues, which are suspected to contribute to her headaches and balance problems. She experienced a severe walking problem two days ago and has difficulty moving her neck. She has not previously undergone vestibular rehabilitation therapy.  Update last OV  Discussed the use of AI scribe software for clinical note transcription with the patient, who gave verbal consent to proceed.  Jacqueline Morgan is an 83 year old female who presents for evaluation of possible temporal arteritis and dizziness/balance problems. She was referred by her primary care doctor for evaluation of possible temporal arteritis based on elevated ESR and right temporal pain/discomfort associated with some vision changes on the right side.   She experiences dizziness characterized by a sensation of feeling like she is about to pass out. She has a history of cardiac issues but has not had any recent emergency room visits for these symptoms. Established with cardiology.   She has a history of epistaxis, with two recent episodes occurring on the left side, but notes improvement compared to previous occurrences (after her last ED visit and nasal packing). She has been using saline sprays and Vaseline for management.  She reports right ear symptoms including sensation of ear swelling, a sensation of fullness, and hearing difficulties, which have been ongoing for over a year. She describes pain in  her right temple and it radiates around her right ear and down her neck. She has experienced vision changes, particularly in the right eye, but her eye doctor has not identified any specific issues.  She has a history of tremors and has seen neurology in the past, but was unable to take the prescribed medications due to interactions with warfarin. She was offered brain stimulation therapy but declined. She does not have teeth, which affects her ability to clench her jaw. She does not think she has TMJ sy.   Her inflammatory markers, including CRP and ESR, were elevated in recent tests.   Records Reviewed:  Office visit with Dr Fairy Homer 08/02/23 Jacqueline Morgan is an 83 year old female who presents with recurrent epistaxis and associated hallucinations.   She experienced a significant episode of epistaxis on the Sunday morning before Easter, necessitating emergency room intervention. Initial management included the insertion of a balloon, which was later replaced with a larger one due to continued bleeding. This balloon remained in place for four days. Subsequently, she was evaluated by an ENT specialist who removed the balloon and repacked her nose with a dissolvable material. Despite these interventions, she experienced another episode of bleeding last night, which she managed with Afrin spray and bleed stop products. No large amounts of bleeding have occurred since the ENT visit, only one minor episode.   Since the ENT visit, she has experienced hallucinations and episodes of syncope. She describes being 'out' for two to three days, with difficulty distinguishing between reality and dreams. She hears music in her head, including specific songs, and sees people who are not present.  These symptoms began after her ENT appointment, where she required assistance to get into a car due to her altered mental state.   She reports a persistent headache on the right side of her head, with pain radiating from  her temple. The headache worsens in the evening. She also experiences nausea without vomiting and has a decreased appetite, leading to inadequate food and fluid intake. Her energy levels are low, and she has been sleeping excessively during the day.   Her balance has worsened over the past week, requiring assistance to walk. She has been using a cane at home to aid her mobility. She reports missing church for the entire month due to her symptoms and has been unable to drive until today, marking her first time driving in two to three weeks.       Patient describes pain discomfort on the right side of her head in the temple region.  Relates a lot of aching discomfort.  Also states auditory hallucinations where she hears music being played when she knows it is not being plagued she denies any visual hallucinations.   1. 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  Seems a little worse recently.  Patient not resting well because she is very stressed by the nosebleeds I think she would benefit from having a head scan because of the pain and discomfort but we await the lab work first - Sedimentation rate - C-reactive protein   2. Ataxia See discussion above I find no focal findings - Sedimentation rate - C-reactive protein   3. Tremor, essential Stable - Sedimentation rate - C-reactive protein   4. Hallucinations, unspecified Doubt dementia.  If this persist will need further evaluation   - Sedimentation rate - C-reactive protein   Initial Evaluation  Reason for Consult: recurrent epistaxis    HPI: Discussed the use of AI scribe software for clinical note transcription with the patient, who gave verbal consent to proceed.  History of Present Illness Jacqueline Morgan is an 83 year old female on warfarin who presents with recurrent epistaxis right side, requiring nasal packing in  ED 4 days ago.  She has a history of recurrent epistaxis, primarily on the right side, and is currently on warfarin. She experienced a significant episode in March that resolved without intervention and another 4 days ago that required nasal packing at the ER. She has had several episodes in between, some of which she managed to stop at home using Vaseline and Afrin, although these measures were not always effective.  Her blood pressure has been well-controlled recently. After the ER visit in April, she noticed some blood around the packing but did not experience active bleeding. No sensation of blood running down her throat after the ER visit.  She has never had nasal packing before the recent ER visit. During the ER visit, a smaller packing was initially used, but she bled around it, necessitating a larger packing. The packing has been in place for four days, causing significant discomfort and irritation.  She has been on warfarin for ten and a half years, and the epistaxis began in March, which was her first episode. She has been using Vaseline daily to manage the dryness in her nose.   Jacqueline Morgan is an 83 year old female who presents for evaluation of possible temporal arteritis. She was referred by her primary care doctor for evaluation of possible temporal  arteritis.  She experiences dizziness characterized by a sensation of feeling like she is about to pass out. She has a history of cardiac issues but has not had any recent emergency room visits for these symptoms.  She has a history of epistaxis, with two recent episodes occurring on the left side, but notes improvement compared to previous occurrences. She has been using saline sprays and Vaseline for management.  She reports ear symptoms including swelling, a sensation of fullness, and hearing difficulties, which have been ongoing for over a year. She describes pain in her temples and down her neck. She has experienced vision  changes, particularly in the right eye, but her eye doctor has not identified any specific issues.  She has a history of tremors and has seen neurology in the past, but was unable to take the prescribed medications due to interactions with warfarin. She was offered brain stimulation therapy but declined. She does not have teeth, which affects her ability to clench her jaw.  Her inflammatory markers, including CRP and ESR, were elevated in recent tests.     Records Reviewed:  ED visit 07/16/23 Brithney Melven Stable Lepkowski is a 83 y.o. 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  Began this morning.  Coming out of the right nostril.  Tried Afrin at home without improvement.  Denies any medication changes.  No syncope.  No nasal trauma   Had Rhinorocket placed  ED visit 06/11/23  Donnabelle Melven Stable Palmisano is a 83 y.o. female.  Patient presents to the emergency department concerns of a nosebleed.  She reportedly takes warfarin.  She states that she was nervous about the nosebleed as she has on warfarin.  Denies any significant digital trauma.  States that she was blowing her nose when the bleeding started.  No recent similar episodes.  Denies any feelings of dizziness, lightheadedness, weakness.   Did not have packing    Past Medical History:  Diagnosis Date   Anxiety    Arthritis    Complication of anesthesia    passed out after dental surgery   Depression    Diabetes mellitus without complication (HCC)    Fibromyalgia    GERD (gastroesophageal reflux disease)    Hyperlipidemia    Hypertension    Palpitations    Tremor, essential 11/30/2015    Past Surgical History:  Procedure Laterality Date   ARTERY BIOPSY Right 08/30/2023   Procedure: BIOPSY TEMPORAL ARTERY;  Surgeon: Artice Last, MD;  Location: [facility] OR;  Service: ENT;  Laterality: Right;   COLONOSCOPY N/A 04/01/2015   Procedure: COLONOSCOPY;  Surgeon: Ruby Corporal, MD;   Location: [facility];  Service: Endoscopy;  Laterality: N/A;  1:45   COLONOSCOPY WITH PROPOFOL  N/A 10/18/2019   Procedure: COLONOSCOPY WITH PROPOFOL ;  Surgeon: Ruby Corporal, MD;  Location: [facility];  Service: Endoscopy;  Laterality: N/A;  730   EMBOLECTOMY Left 12/19/2012   Procedure: EMBOLECTOMY OF LEFT BRACHIAL ARTERY;  Surgeon: Arvil Lauber, MD;  Location: [facility] OR;  Service: Vascular;  Laterality: Left;   EYE SURGERY Bilateral    cataract   POLYPECTOMY  10/18/2019   Procedure: POLYPECTOMY;  Surgeon: Ruby Corporal, MD;  Location: [facility];  Service: Endoscopy;;    Family History  Problem Relation Age of Onset   Cancer Mother        ovarian, colon   Heart disease Mother    Heart attack Father    Glaucoma Father  COPD Sister    COPD Brother    Benign prostatic hyperplasia Brother     Social History:  reports that she has never smoked. She has never been exposed to tobacco smoke. She has never used smokeless tobacco. She reports that she does not drink alcohol and does not use drugs.  Allergies:  Allergies  Allergen Reactions   Codeine     REACTION: Adverse gastrointestinal effects   Statins     Felt bad    Medications: I have reviewed the patient's current medications.   The PMH, PSH, Medications, Allergies, and SH were reviewed and updated.  ROS: Constitutional: Negative for fever, weight loss and weight gain. Cardiovascular: Negative for chest pain and dyspnea on exertion. Respiratory: Is not experiencing shortness of breath at rest. Gastrointestinal: Negative for nausea and vomiting. Neurological: Negative for headaches. Psychiatric: The patient is not nervous/anxious  There were no vitals taken for this visit.  PHYSICAL EXAM:  Exam: General: Well-developed, well-nourished Respiratory Respiratory effort: Equal inspiration and expiration without stridor Cardiovascular Peripheral Vascular: Warm extremities with equal color/perfusion Eyes:  No nystagmus with equal extraocular motion bilaterally Neuro/Psych/Balance: Patient oriented to person, place, and time; Appropriate mood and affect; Gait is intact with no imbalance; Cranial nerves I-XII are intact. Unsteady slightly with Romberg, No nystagmus.  Head and Face Inspection: Normocephalic and atraumatic without mass or lesion Palpation: Facial skeleton intact without bony stepoffs Salivary Glands: No mass or tenderness Facial Strength: Facial motility symmetric and full bilaterally ENT Pinna: External ear intact and fully developed External canal: Canal is patent with intact skin Tympanic Membrane: Clear and mobile External Nose: No scar or anatomic deformity Internal Nose: Septum is deviated to the right Rhinorocket removed from the right side, small amount of blood clot and evidence of abraded mucosa along the anterior septum and inferior turbinate on the right. No active bleeding. No polyp, or purulence. No mass. Lips, Teeth, and gums: Mucosa and teeth intact and viable TMJ: mild  pain to palpation on right side with full mobility Oral cavity/oropharynx: No erythema or exudate, no lesions present no blood or clot Nasopharynx: No mass or lesion with intact mucosa Neck Neck and Trachea: Midline trachea without mass or lesion Thyroid : No mass or nodularity Lymphatics: No lymphadenopathy    Studies Reviewed: Labs PT/INR 25.9/2.3 on 07/16/23 ESR 72 (high) 2 weeks ago  MRI brain 11/21/22 IMPRESSION: 1.  No evidence of an acute intracranial abnormality. 2. Chronic small vessel ischemic changes which are mild-to-moderate in the cerebral white matter, and minimal in the pons. These findings are similar to the prior brain MRI of 01/14/2014. 3. 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 Left ear: Type A-  Normal external ear canal volume with normal middle ear pressure and tympanic membrane compliance.     Pure tone Audiometry: Right ear- Normal to severe sensorineural hearing loss from 250 Hz - 8000 Hz. Left ear-  Normal to moderate sensorineural hearing loss from 250 Hz - 8000 Hz.  Audiogram reviewed personally and hearing loss loops fairly symmetric with the exception of 1000 Hz and 1500 Hz  WDS 96% on the right and 92% on the left   Right temporal artery biopsy 08/30/23 A. RIGHT TEMPORAL ARTERY, BIOPSY:  Benign segment of medium-sized artery showing focal age-related changes  Negative for arteritis   Assessment/Plan: Encounter Diagnoses  Name Primary?   Sensorineural  hearing loss (SNHL) of both ears Yes   Tremor    Balance disorder    Temple tenderness    Dizziness       Assessment and Plan Assessment & Plan   Update 08/16/23 Assessment & Plan Suspected Temporal arteritis Suspected due to temple pain, neck pain, jaw claudication, changes in vision R > L elevated CRP and ESR. Biopsy needed to confirm/rule out diagnosis and guide treatment due to potential vision loss if undiagnosed.  - risks and benefits of the procedure discussed and she would like to proceed with right temporal artery biopsy with sedation. - Coordinate with cardiology to have plan for Warfarin prior to procedure - Expedite biopsy scheduling if possible   Hearing loss Intermittent hearing loss with ear fullness (Right side more affected). No infections and ear exam is unremarkable. Differential includes SNHL, CHL vs other pathology - Order hearing test. - Consider MRI if hearing test indicates asymmetric hearing loss especially since she also reports balance issues (although it is unclear what causes her sx, and some of her sx are described more like dizziness and near-syncope)  Dizziness Balance problems, Ataxia  Dizziness description as near-syncope and unsteadiness suggests non-otologic causes,  possible cardiac or neurologic etiology suspected. Could also be related to tremor, neuropathy. Will refer to vestibular therapy in case some of her sx could be related to vestibular dysfunction although my suspicion for that is low.  - Refer to cardiology for evaluation. 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 Vestibular rehab will help if sx are related to stroke.  - Refer to vestibular rehabilitation. - Reestablish neurology care - seen previously 2/2 hx of tremor, but has not seen for a long time - referral placed - will defer to neurology to determine if patient needs repeat brain MRI  Epistaxis Recurrent left-sided epistaxis, improved. No active bleeding, managed with saline and Vaseline. - Continue saline sprays and Vaseline.  Benign tremor Previous neurology evaluation. Unable to take medications due to warfarin interaction. No current follow-up. - Reestablish neurology care - referral sent   F/u  Neurology and Cardiology F/u Vestibular rehab RTC for surgical procedure  Audiology   Update 09/13/2023 Assessment and Plan Assessment & Plan Hearing loss Significant symmetric hearing loss with decreased mid to high frequencies. Word discrimination score is 90's%. Candidate for hearing aids, but she prefers to wait until after her eye examination. - Offer hearing aids if interested. - Suggest return for hearing test in one year.  Chronic neck pain Chronic neck pain potentially contributing to headaches and balance problems. Referral pain from neck to temple considered. No prior vestibular rehabilitation therapy. Physical therapy and PMNR discussed as potential interventions. - Refer to physical therapy for neck and balance exercises. - Consider referral to [facility] for further evaluation and potential interventions. - sports medicine  referral if physical therapy.     Artice Last, MD Otolaryngology [facility] Phone: [phone] Fax: [phone]    09/13/2023, 12:47 PM

## 2023-09-14 ENCOUNTER — Telehealth: Payer: Self-pay | Admitting: Pharmacy Technician

## 2023-09-14 ENCOUNTER — Other Ambulatory Visit (HOSPITAL_COMMUNITY): Payer: Self-pay

## 2023-09-14 NOTE — Telephone Encounter (Signed)
 Pharmacy Patient Advocate Encounter   Received notification from CoverMyMeds that prior authorization for PARoxetine  HCl 20MG  tablets is required/requested.   Insurance verification completed.   The patient is insured through [facility] .   Per test claim: PA required; PA submitted to above mentioned insurance via CoverMyMeds Key/confirmation #/EOC [id number] Status is pending.  PA is a request for a Quantity Limit exception. Insurance allows 1 per day and pt needs 3 per day.

## 2023-09-14 NOTE — Telephone Encounter (Signed)
 Pharmacy Patient Advocate Encounter  Received notification from [facility] that Prior Authorization for PARoxetine  HCl 20MG  tablets has been APPROVED from 09/14/2023 to 09/13/2024. Ran test claim, Copay is $4.90/90 day supply. This test claim was processed through [facility]- copay amounts may vary at other pharmacies due to pharmacy/plan contracts, or as the patient moves through the different stages of their insurance plan.   PA #/Case ID/Reference #: [id number]

## 2023-09-25 ENCOUNTER — Ambulatory Visit: Attending: Cardiology | Admitting: *Deleted

## 2023-09-25 DIAGNOSIS — Z5181 Encounter for therapeutic drug level monitoring: Secondary | ICD-10-CM

## 2023-09-25 DIAGNOSIS — I82621 Acute embolism and thrombosis of deep veins of right upper extremity: Secondary | ICD-10-CM

## 2023-09-25 LAB — POCT INR: INR: 2.1 (ref 2.0–3.0)

## 2023-09-25 NOTE — Patient Instructions (Signed)
 Continue warfarin 1/2 tablet daily except 1 tablet on Mondays and Fridays.   ?Recheck INR 6 weeks  ?

## 2023-09-28 ENCOUNTER — Encounter: Payer: Self-pay | Admitting: Internal Medicine

## 2023-09-28 ENCOUNTER — Ambulatory Visit: Admitting: Internal Medicine

## 2023-10-09 DIAGNOSIS — H401131 Primary open-angle glaucoma, bilateral, mild stage: Secondary | ICD-10-CM | POA: Diagnosis not present

## 2023-10-09 DIAGNOSIS — H04123 Dry eye syndrome of bilateral lacrimal glands: Secondary | ICD-10-CM | POA: Diagnosis not present

## 2023-10-09 DIAGNOSIS — E119 Type 2 diabetes mellitus without complications: Secondary | ICD-10-CM | POA: Diagnosis not present

## 2023-10-09 DIAGNOSIS — Z961 Presence of intraocular lens: Secondary | ICD-10-CM | POA: Diagnosis not present

## 2023-10-17 ENCOUNTER — Ambulatory Visit (HOSPITAL_COMMUNITY)

## 2023-10-19 ENCOUNTER — Ambulatory Visit: Admitting: Internal Medicine

## 2023-10-19 ENCOUNTER — Encounter: Payer: Self-pay | Admitting: Neurology

## 2023-10-20 ENCOUNTER — Ambulatory Visit (INDEPENDENT_AMBULATORY_CARE_PROVIDER_SITE_OTHER): Admitting: Otolaryngology

## 2023-10-24 DIAGNOSIS — E1142 Type 2 diabetes mellitus with diabetic polyneuropathy: Secondary | ICD-10-CM | POA: Diagnosis not present

## 2023-10-24 DIAGNOSIS — L84 Corns and callosities: Secondary | ICD-10-CM | POA: Diagnosis not present

## 2023-10-24 DIAGNOSIS — L603 Nail dystrophy: Secondary | ICD-10-CM | POA: Diagnosis not present

## 2023-11-01 ENCOUNTER — Encounter: Payer: Self-pay | Admitting: Family Medicine

## 2023-11-02 NOTE — Telephone Encounter (Signed)
 Nurses I would recommend a office visit within the next few days with one of the providers here-unfortunately I am out of the office the next few days-if no availabilities are available I recommend urgent care

## 2023-11-03 ENCOUNTER — Encounter (INDEPENDENT_AMBULATORY_CARE_PROVIDER_SITE_OTHER): Payer: Self-pay | Admitting: Otolaryngology

## 2023-11-05 ENCOUNTER — Telehealth: Admitting: Physician Assistant

## 2023-11-05 DIAGNOSIS — R3989 Other symptoms and signs involving the genitourinary system: Secondary | ICD-10-CM

## 2023-11-06 ENCOUNTER — Ambulatory Visit: Attending: Cardiology | Admitting: *Deleted

## 2023-11-06 DIAGNOSIS — Z5181 Encounter for therapeutic drug level monitoring: Secondary | ICD-10-CM

## 2023-11-06 DIAGNOSIS — I82621 Acute embolism and thrombosis of deep veins of right upper extremity: Secondary | ICD-10-CM | POA: Diagnosis not present

## 2023-11-06 LAB — POCT INR: INR: 2.8 (ref 2.0–3.0)

## 2023-11-06 NOTE — Patient Instructions (Signed)
 Continue warfarin 1/2 tablet daily except 1 tablet on Mondays and Fridays.   ?Recheck INR 6 weeks  ?

## 2023-11-06 NOTE — Progress Notes (Signed)

## 2023-11-07 ENCOUNTER — Ambulatory Visit
Admission: RE | Admit: 2023-11-07 | Discharge: 2023-11-07 | Disposition: A | Discharge status: Home / Self Care | Source: Ambulatory Visit | Attending: Family Medicine | Admitting: Family Medicine

## 2023-11-07 VITALS — BP 135/79 | HR 67 | Resp 24

## 2023-11-07 DIAGNOSIS — R300 Dysuria: Secondary | ICD-10-CM | POA: Diagnosis not present

## 2023-11-07 LAB — POCT URINE DIPSTICK
Glucose, UA: NEGATIVE mg/dL
Ketones, POC UA: NEGATIVE mg/dL
Leukocytes, UA: NEGATIVE
Nitrite, UA: NEGATIVE
POC PROTEIN,UA: 30 — AB
Spec Grav, UA: 1.015 (ref 1.010–1.025)
Urobilinogen, UA: 1 U/dL
pH, UA: 5.5 (ref 5.0–8.0)

## 2023-11-07 MED ORDER — CEPHALEXIN 500 MG PO CAPS: 500.0000 mg | ORAL_CAPSULE | Freq: Two times a day (BID) | ORAL | 0 refills | Status: DC

## 2023-11-07 NOTE — ED Triage Notes (Signed)
 Pt reports burning with urination, urinary frequency, urinary urgency x 1 week. Has done a home test and it  showed leukocytes in the urine on yesterday.

## 2023-11-07 NOTE — ED Provider Notes (Signed)
 [facility]    CSN: [id number] Arrival date & time: 11/07/23  1259      History   Chief Complaint Chief Complaint  Patient presents with   Urinary Frequency    Leukocytes on home test - Entered by patient    HPI Jacqueline Morgan is a 83 y.o. female.   Patient presenting today with 1 week history of dysuria, urinary frequency and urgency.  Denies hematuria, pelvic or abdominal pain, nausea, vomiting, fevers.  Took a home UTI test that was positive.  When asked, does endorse drinking mostly Dr. Nunzio and not drinking enough water .  So far not trying anything over-the-counter for symptoms.    Past Medical History:  Diagnosis Date   Anxiety    Arthritis    Complication of anesthesia    passed out after dental surgery   Depression    Diabetes mellitus without complication (HCC)    Fibromyalgia    GERD (gastroesophageal reflux disease)    Hyperlipidemia    Hypertension    Palpitations    Tremor, essential 11/30/2015    Patient Active Problem List   Diagnosis Date Noted   Chronic nonintractable headache 08/30/2023   Atypical facial pain 08/30/2023   Diabetes mellitus without complication (HCC) 03/01/2022   Type 2 diabetes mellitus with stage 3b chronic kidney disease, without long-term current use of insulin  (HCC) 03/01/2022   Hyperlipidemia associated with type 2 diabetes mellitus (HCC) 06/03/2020   History of colonic polyps 06/07/2018   Gastroesophageal reflux disease without esophagitis 06/07/2018   Osteoporosis 01/26/2018   Prediabetes 10/25/2017   GAD (generalized anxiety disorder) 01/27/2017   Subjective visual disturbance 11/30/2015   Tremor, essential 11/30/2015   GERD (gastroesophageal reflux disease) 06/24/2014   Multiple thyroid  nodules 06/24/2014   Encounter for therapeutic drug monitoring 05/06/2013   PAD (peripheral artery disease) (HCC) 12/31/2012   DVT of upper extremity (deep vein thrombosis) (HCC) 12/24/2012   Long term (current) use  of anticoagulants 12/24/2012   GASTROESOPHAGEAL REFLUX DISEASE 05/19/2009   FIBROMYALGIA 05/19/2009   SYNCOPE 05/19/2009   Hyperglycemia 05/19/2009   Panic attacks 05/07/2009   DEPRESSION 05/07/2009   Essential hypertension 05/07/2009    Past Surgical History:  Procedure Laterality Date   ARTERY BIOPSY Right 08/30/2023   Procedure: BIOPSY TEMPORAL ARTERY;  Surgeon: Okey Burns, MD;  Location: [facility] OR;  Service: ENT;  Laterality: Right;   COLONOSCOPY N/A 04/01/2015   Procedure: COLONOSCOPY;  Surgeon: Claudis RAYMOND Rivet, MD;  Location: [facility];  Service: Endoscopy;  Laterality: N/A;  1:45   COLONOSCOPY WITH PROPOFOL  N/A 10/18/2019   Procedure: COLONOSCOPY WITH PROPOFOL ;  Surgeon: Rivet Claudis RAYMOND, MD;  Location: [facility];  Service: Endoscopy;  Laterality: N/A;  730   EMBOLECTOMY Left 12/19/2012   Procedure: EMBOLECTOMY OF LEFT BRACHIAL ARTERY;  Surgeon: Redell LITTIE Door, MD;  Location: [facility] OR;  Service: Vascular;  Laterality: Left;   EYE SURGERY Bilateral    cataract   POLYPECTOMY  10/18/2019   Procedure: POLYPECTOMY;  Surgeon: Rivet Claudis RAYMOND, MD;  Location: [facility];  Service: Endoscopy;;    OB History   No obstetric history on file.      Home Medications    Prior to Admission medications   Medication Sig Start Date End Date Taking? Authorizing Provider  cephALEXin  (KEFLEX ) 500 MG capsule Take 1 capsule (500 mg total) by mouth 2 (two) times daily. 11/07/23  Yes Stuart Vernell Norris, PA-C  acetaminophen  (TYLENOL ) 500 MG tablet Take 2 tablets (  1,000 mg total) by mouth every 8 (eight) hours as needed for moderate pain (pain score 4-6). For headache/pain 08/30/23   Soldatova, Liuba, MD  albuterol  (VENTOLIN  HFA) 108 (90 Base) MCG/ACT inhaler Inhale 1-2 puffs into the lungs every 6 (six) hours as needed for wheezing or shortness of breath. 04/03/23   Myrna Camelia HERO, NP  amoxicillin -clavulanate (AUGMENTIN ) 500-125 MG tablet Take 1 tablet by mouth 2 (two) times  daily. Patient not taking: Reported on 09/13/2023 08/30/23   Soldatova, Liuba, MD  blood glucose meter kit and supplies Dispense based on patient and insurance preference. Use to check sugars once per day. (FOR ICD-10 E10.9, E11.9). 02/05/21   Alphonsa Glendia LABOR, MD  fluticasone  (FLONASE ) 50 MCG/ACT nasal spray Place 2 sprays into both nostrils daily. 12/21/21   Alphonsa Glendia LABOR, MD  glucose blood [facility] VERIO) test strip USE TO CHECK SUGAR ONCE DAILY DX CODE R73.03, E11.69, E78.5 05/18/21   Alphonsa Glendia LABOR, MD  Lancets [facility] DELICA PLUS [place]) MISC USE TO CHECK SUGAR ONCE DAILY DX CODE R73.03, E11.69, E78.5 05/18/21   Alphonsa Glendia LABOR, MD  latanoprost  (XALATAN ) 0.005 % ophthalmic solution Place 1 drop into both eyes at bedtime. 05/18/23   [provider]  LORazepam  (ATIVAN ) 1 MG tablet TAKE (1/2) TABLET DURING THE DAY AND (1) TABLET AT BEDTIME AS NEEDED. 08/25/23   Alphonsa Glendia LABOR, MD  metFORMIN  (GLUCOPHAGE -XR) 500 MG 24 hr tablet Take 1 tablet (500 mg total) by mouth daily with breakfast. 09/13/23   Alphonsa Glendia LABOR, MD  metoprolol  succinate (TOPROL -XL) 25 MG 24 hr tablet TAKE 1 TABLET BY MOUTH ONCE  DAILY 09/13/23   Alphonsa Glendia LABOR, MD  ondansetron  (ZOFRAN ) 8 MG tablet Take 1 tablet (8 mg total) by mouth 3 (three) times daily as needed for nausea or vomiting. 09/07/23   Alphonsa Glendia LABOR, MD  oxyCODONE  (ROXICODONE ) 5 MG immediate release tablet Take 1 tablet (5 mg total) by mouth every 4 (four) hours as needed for severe pain (pain score 7-10). Patient not taking: Reported on 09/13/2023 08/30/23   Soldatova, Liuba, MD  pantoprazole  (PROTONIX ) 40 MG tablet Take 1 tablet (40 mg total) by mouth 2 (two) times daily. 09/13/23   Alphonsa Glendia LABOR, MD  PARoxetine  (PAXIL ) 20 MG tablet TAKE 1 TABLET BY MOUTH IN THE  MORNING AND 2 TABLETS BY MOUTH  IN THE EVENING 09/13/23   Luking, Glendia LABOR, MD  rosuvastatin  (CRESTOR ) 10 MG tablet Take 1 tablet (10 mg total) by mouth daily. 09/13/23   Alphonsa Glendia LABOR, MD  sodium  chloride (OCEAN) 0.65 % SOLN nasal spray Place 1 spray into both nostrils as needed. 07/20/23   Soldatova, Liuba, MD  sucralfate  (CARAFATE ) 1 g tablet TAKE 1 TABLET BY MOUTH BEFORE MEALS AND AT BEDTIME. Patient taking differently: Take 1 g by mouth daily as needed (ulcers). 06/30/22   Alphonsa Glendia LABOR, MD  warfarin (COUMADIN ) 5 MG tablet TAKE 1/2 -1 TABLET BY MOUTH DAILY AS DIRECTED. Patient taking differently: Take 2.5-5 mg by mouth See admin instructions. Take 5 mg by mouth on Monday and Friday and take 2.5 mg on Tuesday-Thursday, Saturday and Sunday 03/13/23   Alvan Dorn FALCON, MD    Family History Family History  Problem Relation Age of Onset   Cancer Mother        ovarian, colon   Heart disease Mother    Heart attack Father    Glaucoma Father    COPD Sister    COPD Brother  Benign prostatic hyperplasia Brother     Social History Social History   Tobacco Use   Smoking status: Never    Passive exposure: Never   Smokeless tobacco: Never  Vaping Use   Vaping status: Never Used  Substance Use Topics   Alcohol use: No   Drug use: No     Allergies   Codeine and Statins   Review of Systems Review of Systems Per HPI  Physical Exam Triage Vital Signs ED Triage Vitals  Encounter Vitals Group     BP 11/07/23 1306 135/79     Girls Systolic BP Percentile --      Girls Diastolic BP Percentile --      Boys Systolic BP Percentile --      Boys Diastolic BP Percentile --      Pulse Rate 11/07/23 1330 67     Resp 11/07/23 1306 (!) 24     Temp --      Temp Source 11/07/23 1306 Oral     SpO2 11/07/23 1330 97 %     Weight --      Height --      Head Circumference --      Peak Flow --      Pain Score 11/07/23 1314 2     Pain Loc --      Pain Education --      Exclude from Growth Chart --    No data found.  Updated Vital Signs BP 135/79 (BP Location: Right Arm)   Pulse 67   Resp (!) 24   SpO2 97%   Visual Acuity Right Eye Distance:   Left Eye Distance:    Bilateral Distance:    Right Eye Near:   Left Eye Near:    Bilateral Near:     Physical Exam Vitals and nursing note reviewed.  Constitutional:      Appearance: Normal appearance. She is not ill-appearing.  HENT:     Head: Atraumatic.  Eyes:     Extraocular Movements: Extraocular movements intact.     Conjunctiva/sclera: Conjunctivae normal.  Cardiovascular:     Rate and Rhythm: Normal rate.  Pulmonary:     Effort: Pulmonary effort is normal.  Abdominal:     General: Bowel sounds are normal. There is no distension.     Palpations: Abdomen is soft.     Tenderness: There is no abdominal tenderness. There is no guarding.  Musculoskeletal:        General: Normal range of motion.     Cervical back: Normal range of motion and neck supple.  Skin:    General: Skin is warm and dry.  Neurological:     Mental Status: She is alert and oriented to person, place, and time.  Psychiatric:        Mood and Affect: Mood normal.        Thought Content: Thought content normal.        Judgment: Judgment normal.      UC Treatments / Results  Labs (all labs ordered are listed, but only abnormal results are displayed) Labs Reviewed  POCT URINE DIPSTICK - Abnormal; Notable for the following components:      Result Value   Bilirubin, UA small (*)    Blood, UA trace-intact (*)    POC PROTEIN,UA =30 (*)    All other components within normal limits  URINE CULTURE    EKG   Radiology No results found.  Procedures Procedures (including critical care time)  Medications  Ordered in UC Medications - No data to display  Initial Impression / Assessment and Plan / UC Course  I have reviewed the triage vital signs and the nursing notes.  Pertinent labs & imaging results that were available during my care of the patient were reviewed by me and considered in my medical decision making (see chart for details).     Urinalysis today without obvious evidence of a urinary tract infection but  given symptoms and risk factors for UTI will treat with Keflex  while awaiting urine culture for further evaluation.  Do suspect based on findings and history that she may be a bit dehydrated.  Discussed to reduce Dr. Nunzio intake, drink more fluids and follow-up with primary care if worsening or not resolving.  Final Clinical Impressions(s) / UC Diagnoses   Final diagnoses:  Dysuria     Discharge Instructions      I have sent over some antibiotics while we await your urine culture results to rule out a urinary tract infection.  I suspect your symptoms and urine test findings more so to be related to hydration so make sure to be drinking less soda and more plain water .  Follow-up with your primary care provider if not resolving    ED Prescriptions     Medication Sig Dispense Auth. Provider   cephALEXin  (KEFLEX ) 500 MG capsule Take 1 capsule (500 mg total) by mouth 2 (two) times daily. 10 capsule Stuart Vernell Norris, [place]      PDMP not reviewed this encounter.   Stuart Vernell Norris, [place] 11/07/23 1350

## 2023-11-07 NOTE — Discharge Instructions (Signed)
 I have sent over some antibiotics while we await your urine culture results to rule out a urinary tract infection.  I suspect your symptoms and urine test findings more so to be related to hydration so make sure to be drinking less soda and more plain water .  Follow-up with your primary care provider if not resolving

## 2023-11-08 LAB — URINE CULTURE

## 2023-11-10 ENCOUNTER — Ambulatory Visit (HOSPITAL_COMMUNITY): Payer: Self-pay

## 2023-11-13 ENCOUNTER — Encounter (INDEPENDENT_AMBULATORY_CARE_PROVIDER_SITE_OTHER): Payer: Self-pay | Admitting: Otolaryngology

## 2023-11-13 ENCOUNTER — Ambulatory Visit (INDEPENDENT_AMBULATORY_CARE_PROVIDER_SITE_OTHER): Admitting: Otolaryngology

## 2023-11-13 VITALS — BP 164/73

## 2023-11-13 DIAGNOSIS — R519 Headache, unspecified: Secondary | ICD-10-CM

## 2023-11-13 DIAGNOSIS — G501 Atypical facial pain: Secondary | ICD-10-CM

## 2023-11-13 DIAGNOSIS — J019 Acute sinusitis, unspecified: Secondary | ICD-10-CM | POA: Diagnosis not present

## 2023-11-13 DIAGNOSIS — M542 Cervicalgia: Secondary | ICD-10-CM

## 2023-11-13 DIAGNOSIS — H903 Sensorineural hearing loss, bilateral: Secondary | ICD-10-CM

## 2023-11-13 DIAGNOSIS — H9190 Unspecified hearing loss, unspecified ear: Secondary | ICD-10-CM | POA: Diagnosis not present

## 2023-11-13 MED ORDER — METHYLPREDNISOLONE 4 MG PO TBPK: ORAL_TABLET | ORAL | 1 refills | Status: DC

## 2023-11-13 MED ORDER — AMOXICILLIN-POT CLAVULANATE 875-125 MG PO TABS: 1.0000 | ORAL_TABLET | Freq: Two times a day (BID) | ORAL | 0 refills | Status: DC

## 2023-11-13 NOTE — Progress Notes (Signed)
 INR-2.8 Please see anticoagulation encounter

## 2023-11-13 NOTE — Progress Notes (Signed)
 ENT Progress Note:  Update 11/13/22 Discussed the use of AI scribe software for clinical note transcription with the patient, who gave verbal consent to proceed.  History of Present Illness Jacqueline Morgan is an 83 year old female who presents with right-sided facial pressure and green nasal discharge.  She has been experiencing green mucus, which is both coughed up and drains from her nose, for over two weeks with a recent exacerbation. She has not required antibiotics or steroids for previous sinus infections, although she recalls possibly taking antibiotics many years ago. She has been using a saline nasal spray but has not been using Flonase  recently.  The facial pressure is primarily on the right side, with soreness in the same area. No significant facial pain or painful sensations to touch, except for the right side of her face. Her sense of smell remains intact. She also mentions experiencing right-sided headaches, for which she has not yet seen a specialist.  Additionally, she reports soreness in her gums and pain in her chin and jaws. Her dentist suggested the possibility of trigeminal neuralgia, but she has not yet consulted a neurologist about this. She experienced headaches for three days in the past week, located in the temporal area.   Update 09/13/23  Jacqueline Morgan is an 83 year old female who presents for follow-up after a biopsy and hearing test.  She recently underwent a biopsy of right temporal artery which returned negative results. She notes persistent crusting at the biopsy site.  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 The hearing loss was symmetric. She prefers to delay the use of hearing aids at this time.  She has chronic neck issues, which are suspected to contribute to her headaches and balance problems. She experienced a severe walking problem two days  ago and has difficulty moving her neck. She has not previously undergone vestibular rehabilitation therapy.  Update last OV  Discussed the use of AI scribe software for clinical note transcription with the patient, who gave verbal consent to proceed.  [name] is an 83 year old female who presents for evaluation of possible temporal arteritis and dizziness/balance problems. She was referred by her primary care doctor for evaluation of possible temporal arteritis based on elevated ESR and right temporal pain/discomfort associated with some vision changes on the right side.   She experiences dizziness characterized by a sensation of feeling like she is about to pass out. She has a history of cardiac issues but has not had any recent emergency room visits for these symptoms. Established with cardiology.   She has a history of epistaxis, with two recent episodes occurring on the left side, but notes improvement compared to previous occurrences (after her last ED visit and nasal packing). She has been using saline sprays and Vaseline for management.  She reports right ear symptoms including sensation of ear swelling, a sensation of fullness, and hearing difficulties, which have been ongoing for over a year. She describes pain in her right temple and it radiates around her right ear and down her neck. She has experienced vision changes, particularly in the right eye, but her eye doctor has not identified any specific issues.  She has a history of tremors and has seen neurology in the past, but was unable to take the prescribed medications due to interactions with warfarin. She was offered brain stimulation therapy but declined. She does  not have teeth, which affects her ability to clench her jaw. She does not think she has TMJ sy.   Her inflammatory markers, including CRP and ESR, were elevated in recent tests.   Records Reviewed:  Office visit with Dr Alphonsa 08/02/23 Jacqueline Morgan is an  83 year old female who presents with recurrent epistaxis and associated hallucinations.   She experienced a significant episode of epistaxis on the Sunday morning before Easter, necessitating emergency room intervention. Initial management included the insertion of a balloon, which was later replaced with a larger one due to continued bleeding. This balloon remained in place for four days. Subsequently, she was evaluated by an ENT specialist who removed the balloon and repacked her nose with a dissolvable material. Despite these interventions, she experienced another episode of bleeding last night, which she managed with Afrin spray and bleed stop products. No large amounts of bleeding have occurred since the ENT visit, only one minor episode.   Since the ENT visit, she has experienced hallucinations and episodes of syncope. She describes being 'out' for two to three days, with difficulty distinguishing between reality and dreams. She hears music in her head, including specific songs, and sees people who are not present. These symptoms began after her ENT appointment, where she required assistance to get into a car due to her altered mental state.   She reports a persistent headache on the right side of her head, with pain radiating from her temple. The headache worsens in the evening. She also experiences nausea without vomiting and has a decreased appetite, leading to inadequate food and fluid intake. Her energy levels are low, and she has been sleeping excessively during the day.   Her balance has worsened over the past week, requiring assistance to walk. She has been using a cane at home to aid her mobility. She reports missing church for the entire month due to her symptoms and has been unable to drive until today, marking her first time driving in two to three weeks.       Patient describes pain discomfort on the right side of her head in the temple region.  Relates a lot of aching discomfort.  Also  states auditory hallucinations where she hears music being played when she knows it is not being plagued she denies any visual hallucinations.   1. 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  Seems a little worse recently.  Patient not resting well because she is very stressed by the nosebleeds I think she would benefit from having a head scan because of the pain and discomfort but we await the lab work first - Sedimentation rate - C-reactive protein   2. Ataxia See discussion above I find no focal findings - Sedimentation rate - C-reactive protein   3. Tremor, essential Stable - Sedimentation rate - C-reactive protein   4. Hallucinations, unspecified Doubt dementia.  If this persist will need further evaluation   - Sedimentation rate - C-reactive protein   Initial Evaluation  Reason for Consult: recurrent epistaxis    HPI: Discussed the use of AI scribe software for clinical note transcription with the patient, who gave verbal consent to proceed.  History of Present Illness Jacqueline Morgan is an 83 year old female on warfarin who presents with recurrent epistaxis right side, requiring nasal packing in ED 4 days ago.  She has a history of recurrent epistaxis, primarily on  the right side, and is currently on warfarin. She experienced a significant episode in March that resolved without intervention and another 4 days ago that required nasal packing at the ER. She has had several episodes in between, some of which she managed to stop at home using Vaseline and Afrin, although these measures were not always effective.  Her blood pressure has been well-controlled recently. After the ER visit in April, she noticed some blood around the packing but did not experience active bleeding. No sensation of blood running down her throat after the ER visit.  She has never had nasal  packing before the recent ER visit. During the ER visit, a smaller packing was initially used, but she bled around it, necessitating a larger packing. The packing has been in place for four days, causing significant discomfort and irritation.  She has been on warfarin for ten and a half years, and the epistaxis began in March, which was her first episode. She has been using Vaseline daily to manage the dryness in her nose.     Records Reviewed:  ED visit 07/16/23 Nazanin CHRISTELLA Slavick is a 83 y.o. 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  Began this morning.  Coming out of the right nostril.  Tried Afrin at home without improvement.  Denies any medication changes.  No syncope.  No nasal trauma   Had Rhinorocket placed  ED visit 06/11/23  Rosea CHRISTELLA Wehrman is a 83 y.o. female.  Patient presents to the emergency department concerns of a nosebleed.  She reportedly takes warfarin.  She states that she was nervous about the nosebleed as she has on warfarin.  Denies any significant digital trauma.  States that she was blowing her nose when the bleeding started.  No recent similar episodes.  Denies any feelings of dizziness, lightheadedness, weakness.   Did not have packing    Past Medical History:  Diagnosis Date   Anxiety    Arthritis    Complication of anesthesia    passed out after dental surgery   Depression    Diabetes mellitus without complication (HCC)    Fibromyalgia    GERD (gastroesophageal reflux disease)    Hyperlipidemia    Hypertension    Palpitations    Tremor, essential 11/30/2015    Past Surgical History:  Procedure Laterality Date   ARTERY BIOPSY Right 08/30/2023   Procedure: BIOPSY TEMPORAL ARTERY;  Surgeon: Okey Burns, MD;  Location: [facility] OR;  Service: ENT;  Laterality: Right;   COLONOSCOPY N/A 04/01/2015   Procedure: COLONOSCOPY;  Surgeon: Claudis RAYMOND Rivet, MD;  Location: [facility];  Service:  Endoscopy;  Laterality: N/A;  1:45   COLONOSCOPY WITH PROPOFOL  N/A 10/18/2019   Procedure: COLONOSCOPY WITH PROPOFOL ;  Surgeon: Rivet Claudis RAYMOND, MD;  Location: [facility];  Service: Endoscopy;  Laterality: N/A;  730   EMBOLECTOMY Left 12/19/2012   Procedure: EMBOLECTOMY OF LEFT BRACHIAL ARTERY;  Surgeon: Redell LITTIE Door, MD;  Location: [facility] OR;  Service: Vascular;  Laterality: Left;   EYE SURGERY Bilateral    cataract   POLYPECTOMY  10/18/2019   Procedure: POLYPECTOMY;  Surgeon: Rivet Claudis RAYMOND, MD;  Location: [facility];  Service: Endoscopy;;    Family History  Problem Relation Age of Onset   Cancer Mother        ovarian, colon   Heart disease Mother    Heart attack Father    Glaucoma Father    COPD Sister  COPD Brother    Benign prostatic hyperplasia Brother     Social History:  reports that she has never smoked. She has never been exposed to tobacco smoke. She has never used smokeless tobacco. She reports that she does not drink alcohol and does not use drugs.  Allergies:  Allergies  Allergen Reactions   Codeine     REACTION: Adverse gastrointestinal effects   Statins     Felt bad    Medications: I have reviewed the patient's current medications.   The PMH, PSH, Medications, Allergies, and SH were reviewed and updated.  ROS: Constitutional: Negative for fever, weight loss and weight gain. Cardiovascular: Negative for chest pain and dyspnea on exertion. Respiratory: Is not experiencing shortness of breath at rest. Gastrointestinal: Negative for nausea and vomiting. Neurological: Negative for headaches. Psychiatric: The patient is not nervous/anxious  Blood pressure (!) 164/73.  PHYSICAL EXAM:  Exam: General: Well-developed, well-nourished Respiratory Respiratory effort: Equal inspiration and expiration without stridor Cardiovascular Peripheral Vascular: Warm extremities with equal color/perfusion Eyes: No nystagmus with equal extraocular motion  bilaterally Neuro/Psych/Balance: Patient oriented to person, place, and time; Appropriate mood and affect; Gait is intact with no imbalance; Cranial nerves I-XII are intact. Unsteady slightly with Romberg, No nystagmus.  Head and Face Inspection: Normocephalic and atraumatic without mass or lesion Palpation: Facial skeleton intact without bony stepoffs Salivary Glands: No mass or tenderness Facial Strength: Facial motility symmetric and full bilaterally ENT Pinna: External ear intact and fully developed External canal: Canal is patent with intact skin Tympanic Membrane: Clear and mobile External Nose: No scar or anatomic deformity Internal Nose: Septum is deviated to the right. No polyp, or purulence. Mild ITH.  Lips, Teeth, and gums: Mucosa and teeth intact and viable TMJ: mild  pain to palpation on right side with full mobility Oral cavity/oropharynx: No erythema or exudate, no lesions present no blood or clot Nasopharynx: No mass or lesion with intact mucosa Neck Neck and Trachea: Midline trachea without mass or lesion Thyroid : No mass or nodularity Lymphatics: No lymphadenopathy     PROCEDURE NOTE: nasal endoscopy  Preoperative diagnosis: chronic sinusitis symptoms  Postoperative diagnosis: same  Procedure: Diagnostic nasal endoscopy (68768)  Surgeon: Elena Larry, M.D.  Anesthesia: Topical lidocaine  and Afrin  H&P REVIEW: The patient's history and physical were reviewed today prior to procedure. All medications were reviewed and updated as well. Complications: None Condition is stable throughout exam Indications and consent: The patient presents with symptoms of chronic sinusitis not responding to previous therapies. All the risks, benefits, and potential complications were reviewed with the patient preoperatively and informed consent was obtained. The time out was completed with confirmation of the correct procedure.   Procedure: The patient was seated upright in the  clinic. Topical lidocaine  and Afrin were applied to the nasal cavity. After adequate anesthesia had occurred, the rigid nasal endoscope was passed into the nasal cavity. The nasal mucosa, turbinates, septum, and sinus drainage pathways were visualized bilaterally. This revealed no purulence or significant secretions that might be cultured. There were no polyps or sites of significant inflammation. The mucosa was intact and there was no crusting present. The scope was then slowly withdrawn and the patient tolerated the procedure well. There were no complications or blood loss.   Studies Reviewed: Labs PT/INR 25.9/2.3 on 07/16/23 ESR 72 (high) 2 weeks ago  MRI brain 11/21/22 IMPRESSION: 1.  No evidence of an acute intracranial abnormality. 2. Chronic small vessel ischemic changes which are mild-to-moderate in the cerebral white  matter, and minimal in the pons. These findings are similar to the prior brain MRI of 01/14/2014. 3. 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 Left ear: Type A- Normal external ear canal volume with normal middle ear pressure and tympanic membrane compliance.     Pure tone Audiometry: Right ear- Normal to severe sensorineural hearing loss from 250 Hz - 8000 Hz. Left ear-  Normal to moderate sensorineural hearing loss from 250 Hz - 8000 Hz.  Audiogram reviewed personally and hearing loss loops fairly symmetric with the exception of 1000 Hz and 1500 Hz  WDS 96% on the right and 92% on the left   Right temporal artery biopsy 08/30/23 A. RIGHT TEMPORAL ARTERY, BIOPSY:  Benign segment of medium-sized artery showing focal age-related changes  Negative for arteritis   Assessment/Plan: Encounter Diagnoses  Name Primary?   Temple tenderness Yes   Atypical facial pain    Acute non-recurrent sinusitis,  unspecified location    Sensorineural hearing loss (SNHL) of both ears    Cervicalgia        Assessment and Plan Assessment & Plan   Update 08/16/23 Assessment & Plan Suspected Temporal arteritis Suspected due to temple pain, neck pain, jaw claudication, changes in vision R > L elevated CRP and ESR. Biopsy needed to confirm/rule out diagnosis and guide treatment due to potential vision loss if undiagnosed.  - risks and benefits of the procedure discussed and she would like to proceed with right temporal artery biopsy with sedation. - Coordinate with cardiology to have plan for Warfarin prior to procedure - Expedite biopsy scheduling if possible   Hearing loss Intermittent hearing loss with ear fullness (Right side more affected). No infections and ear exam is unremarkable. Differential includes SNHL, CHL vs other pathology - Order hearing test. - Consider MRI if hearing test indicates asymmetric hearing loss especially since she also reports balance issues (although it is unclear what causes her sx, and some of her sx are described more like dizziness and near-syncope)  Dizziness Balance problems, Ataxia  Dizziness description as near-syncope and unsteadiness suggests non-otologic causes, possible cardiac or neurologic etiology suspected. Could also be related to tremor, neuropathy. Will refer to vestibular therapy in case some of her sx could be related to vestibular dysfunction although my suspicion for that is low.  - Refer to cardiology for evaluation. 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 Vestibular rehab will help if sx are related to stroke.  - Refer to vestibular rehabilitation. - Reestablish neurology care - seen previously 2/2 hx of tremor, but has not seen for a long time - referral placed - will defer to neurology to determine  if patient needs repeat brain MRI  Epistaxis Recurrent left-sided epistaxis, improved. No active bleeding, managed with saline and Vaseline. - Continue saline sprays and Vaseline.  Benign tremor Previous neurology evaluation. Unable to take medications due to warfarin interaction. No current follow-up. - Reestablish neurology care - referral sent   F/u  Neurology and Cardiology F/u Vestibular rehab RTC for surgical procedure  Audiology   Update 11/13/2023 Assessment and Plan Assessment & Plan Acute right-sided sinusitis She reports a couple of weeks of yellow nasal drainage and facial pain/pressure on the right. Clear nasal passages and septal spur  noted on nasal endoscopy. Differential includes sinusitis versus atypical facial pain. She has had long-standing hx of right temple pain, negative temporal artery biopsy. Will treat for acute sinusitis. She is already established with neurology, and will make an appt to be evaluated for atypical headache and trigeminal neuralgia.  - Prescribed Medrol  Dosepak for six days with taper. - Advised taking steroids post-meal and early in the day. - Prescribed a course of Augmentin  for ten days. - Advised follow-up if symptoms persist or if treated for temporal pain by another physician.  Hearing loss Significant symmetric hearing loss with decreased mid to high frequencies. Word discrimination score is 90's%. Candidate for hearing aids, but she prefers to wait until after her eye examination. - Offer hearing aids if interested. - Suggest return for hearing test in one year.  Chronic neck pain Chronic neck pain potentially contributing to headaches and balance problems. Referral pain from neck to temple considered. No prior vestibular rehabilitation therapy. Physical therapy and PMNR discussed as potential interventions. - has an appt with PMNR for further evaluation and potential interventions.    Elena Larry, MD Otolaryngology [facility]  [facility] Phone: [phone] Fax: [phone]    11/13/2023, 8:45 AM

## 2023-11-22 DIAGNOSIS — G4486 Cervicogenic headache: Secondary | ICD-10-CM | POA: Diagnosis not present

## 2023-11-22 DIAGNOSIS — M47819 Spondylosis without myelopathy or radiculopathy, site unspecified: Secondary | ICD-10-CM | POA: Diagnosis not present

## 2023-11-29 ENCOUNTER — Encounter: Payer: Self-pay | Admitting: Family Medicine

## 2023-11-30 ENCOUNTER — Ambulatory Visit (HOSPITAL_COMMUNITY)

## 2023-11-30 ENCOUNTER — Other Ambulatory Visit: Payer: Self-pay

## 2023-11-30 MED ORDER — SUCRALFATE 1 G PO TABS: ORAL_TABLET | ORAL | 5 refills | Status: AC

## 2023-12-13 ENCOUNTER — Telehealth: Payer: Self-pay | Admitting: Cardiology

## 2023-12-13 ENCOUNTER — Ambulatory Visit: Attending: Cardiology | Admitting: Cardiology

## 2023-12-13 ENCOUNTER — Ambulatory Visit (INDEPENDENT_AMBULATORY_CARE_PROVIDER_SITE_OTHER)

## 2023-12-13 ENCOUNTER — Encounter: Payer: Self-pay | Admitting: Cardiology

## 2023-12-13 ENCOUNTER — Other Ambulatory Visit: Payer: Self-pay | Admitting: Cardiology

## 2023-12-13 ENCOUNTER — Ambulatory Visit: Attending: Cardiology

## 2023-12-13 VITALS — BP 118/70 | HR 76 | Ht 60.0 in | Wt 151.0 lb

## 2023-12-13 DIAGNOSIS — Z5181 Encounter for therapeutic drug level monitoring: Secondary | ICD-10-CM | POA: Diagnosis not present

## 2023-12-13 DIAGNOSIS — I82621 Acute embolism and thrombosis of deep veins of right upper extremity: Secondary | ICD-10-CM

## 2023-12-13 DIAGNOSIS — R55 Syncope and collapse: Secondary | ICD-10-CM

## 2023-12-13 DIAGNOSIS — E782 Mixed hyperlipidemia: Secondary | ICD-10-CM

## 2023-12-13 LAB — POCT INR: INR: 1.8 — AB (ref 2.0–3.0)

## 2023-12-13 NOTE — Progress Notes (Signed)
 Clinical Summary Jacqueline Morgan is a 83 y.o.female seen today for follow up of the following medical problems.   1. Left brachial artery occlusion   - admit 12/2012 with left arm pain, found to have occlusion of the left brachial artery.   - underwent left arm thromboembolectomy 12/19/12 by Dr Redell Door   - echo was negative for thrombus. There was some question of possible aortic arch atherosclerosis. Discharged on coumadin .  - denies any bleeding issues on coumadin . Notes some occsaional pain in left arm with activity that is unchanged..   - has not been in favor of NOACs     -denies any bleeding on coumadin      2. Hyperlipidemia 10/2017 TC 368 TG 374 HDL 36 LDL 257 9 /2020 TC 627 TG 607 LDL 248  - had tried crestor  5mg  every other day a few years ago, did not tolerate due to muscle aches. . She reports side effects on lipitor in the past. She is unsure of her other statin histoyr.     -. She reports palpitations on pravastatin  and she stopped taking.    - she reports muscle aches on zetia , stopped taking - muscle aches on crestor  twice a week - resistant to pcsk9 inhibitors   05/2020 TC 321 TG 299 HDL 37 LDL 222 - she is back on crestor  10mg  again, reports she is tolerating.  - 02/2022 TC 135 TG 166 HDL 44 LDL 63 - continues to tolerate to crestor . 07/2023 TC 142 TG 168 HDL 48 LDL 66   3. DOE - recent symptoms with short distances - chronic cough, wheezing she reports 2017 PFTs normal CXR no acute process BNP 34    10/2015 nuclear no ischemia.   08/2019 echo LVEF 60-65%, grade I DDx, normal RV  07/2019 CXR no acute process  SOB singing in church, showering.    - she has chronic SOB unchanged  4. Dizziness - followed by neuro, referred for vestibular rehab     5. Syncope - reports several episodes, can feel like heart is racing.  - most commonly happens in hot weather.  - poor oral hydration. Can have some palpitations.  Past Medical History:  Diagnosis Date    Anxiety    Arthritis    Complication of anesthesia    passed out after dental surgery   Depression    Diabetes mellitus without complication (HCC)    Fibromyalgia    GERD (gastroesophageal reflux disease)    Hyperlipidemia    Hypertension    Palpitations    Tremor, essential 11/30/2015     Allergies  Allergen Reactions   Codeine     REACTION: Adverse gastrointestinal effects   Statins     Felt bad     Current Outpatient Medications  Medication Sig Dispense Refill   acetaminophen  (TYLENOL ) 500 MG tablet Take 2 tablets (1,000 mg total) by mouth every 8 (eight) hours as needed for moderate pain (pain score 4-6). For headache/pain 30 tablet 0   albuterol  (VENTOLIN  HFA) 108 (90 Base) MCG/ACT inhaler Inhale 1-2 puffs into the lungs every 6 (six) hours as needed for wheezing or shortness of breath. 8 g 0   amoxicillin -clavulanate (AUGMENTIN ) 875-125 MG tablet Take 1 tablet by mouth 2 (two) times daily. 20 tablet 0   blood glucose meter kit and supplies Dispense based on patient and insurance preference. Use to check sugars once per day. (FOR ICD-10 E10.9, E11.9). 1 each 0   fluticasone  (FLONASE ) 50  MCG/ACT nasal spray Place 2 sprays into both nostrils daily. 16 g 2   glucose blood (ONETOUCH VERIO) test strip USE TO CHECK SUGAR ONCE DAILY DX CODE R73.03, E11.69, E78.5 50 strip 5   Lancets (ONETOUCH DELICA PLUS LANCET33G) MISC USE TO CHECK SUGAR ONCE DAILY DX CODE R73.03, E11.69, E78.5 100 each 5   latanoprost  (XALATAN ) 0.005 % ophthalmic solution Place 1 drop into both eyes at bedtime.     LORazepam  (ATIVAN ) 1 MG tablet TAKE (1/2) TABLET DURING THE DAY AND (1) TABLET AT BEDTIME AS NEEDED. 45 tablet 5   metFORMIN  (GLUCOPHAGE -XR) 500 MG 24 hr tablet Take 1 tablet (500 mg total) by mouth daily with breakfast. 90 tablet 3   methylPREDNISolone  (MEDROL  DOSEPAK) 4 MG TBPK tablet Take with signs of chronic sinusitis and take as directed 1 each 1   metoprolol  succinate (TOPROL -XL) 25 MG 24 hr  tablet TAKE 1 TABLET BY MOUTH ONCE  DAILY 90 tablet 3   ondansetron  (ZOFRAN ) 8 MG tablet Take 1 tablet (8 mg total) by mouth 3 (three) times daily as needed for nausea or vomiting. 15 tablet 2   pantoprazole  (PROTONIX ) 40 MG tablet Take 1 tablet (40 mg total) by mouth 2 (two) times daily. 180 tablet 3   PARoxetine  (PAXIL ) 20 MG tablet TAKE 1 TABLET BY MOUTH IN THE  MORNING AND 2 TABLETS BY MOUTH  IN THE EVENING 270 tablet 3   rosuvastatin  (CRESTOR ) 10 MG tablet Take 1 tablet (10 mg total) by mouth daily. 90 tablet 3   sodium chloride  (OCEAN) 0.65 % SOLN nasal spray Place 1 spray into both nostrils as needed. 30 mL 5   sucralfate  (CARAFATE ) 1 g tablet TAKE 1 TABLET BY MOUTH BEFORE MEALS AND AT BEDTIME. 120 tablet 5   warfarin (COUMADIN ) 5 MG tablet TAKE 1/2 -1 TABLET BY MOUTH DAILY AS DIRECTED. (Patient taking differently: Take 2.5-5 mg by mouth See admin instructions. Take 5 mg by mouth on Monday and Friday and take 2.5 mg on Tuesday-Thursday, Saturday and Sunday) 30 tablet 5   No current facility-administered medications for this visit.     Past Surgical History:  Procedure Laterality Date   ARTERY BIOPSY Right 08/30/2023   Procedure: BIOPSY TEMPORAL ARTERY;  Surgeon: Okey Burns, MD;  Location: [facility] OR;  Service: ENT;  Laterality: Right;   COLONOSCOPY N/A 04/01/2015   Procedure: COLONOSCOPY;  Surgeon: Claudis RAYMOND Rivet, MD;  Location: [facility];  Service: Endoscopy;  Laterality: N/A;  1:45   COLONOSCOPY WITH PROPOFOL  N/A 10/18/2019   Procedure: COLONOSCOPY WITH PROPOFOL ;  Surgeon: Rivet Claudis RAYMOND, MD;  Location: [facility];  Service: Endoscopy;  Laterality: N/A;  730   EMBOLECTOMY Left 12/19/2012   Procedure: EMBOLECTOMY OF LEFT BRACHIAL ARTERY;  Surgeon: Redell LITTIE Door, MD;  Location[facility];  Service: Vascular;  Laterality: Left;   EYE SURGERY Bilateral    cataract   POLYPECTOMY  10/18/2019   Procedure: POLYPECTOMY;  Surgeon: Rivet Claudis RAYMOND, MD;  Location: [facility];   Service: Endoscopy;;     Allergies  Allergen Reactions   Codeine     REACTION: Adverse gastrointestinal effects   Statins     Felt bad      Family History  Problem Relation Age of Onset   Cancer Mother        ovarian, colon   Heart disease Mother    Heart attack Father    Glaucoma Father    COPD Sister    COPD Brother  Benign prostatic hyperplasia Brother      Social History Ms. Barrie reports that she has never smoked. She has never been exposed to tobacco smoke. She has never used smokeless tobacco. Ms. Raynor reports no history of alcohol use.   Physical Examination Vitals:   12/13/23 1443  BP: 118/70  Pulse: 76  SpO2: 98%   Filed Weights   12/13/23 1443  Weight: 151 lb (68.5 kg)    Gen: resting comfortably, no acute distress HEENT: no scleral icterus, pupils equal round and reactive, no palptable cervical adenopathy,  CV: RRR, no mrg, no jvd Resp: Clear to auscultation bilaterally GI: abdomen is soft, non-tender, non-distended, normal bowel sounds, no hepatosplenomegaly MSK: extremities are warm, no edema.  Skin: warm, no rash Neuro:  no focal deficits Psych: appropriate affect   Diagnostic Studies  05/2013 21 day Event monitor No arrythmias   07/2015 echo Study Conclusions   - Left ventricle: The cavity size was normal. Wall thickness was   increased in a pattern of mild LVH. Systolic function was normal.   The estimated ejection fraction was in the range of 60% to 65%.   Wall motion was normal; there were no regional wall motion   abnormalities. Doppler parameters are consistent with abnormal   left ventricular relaxation (grade 1 diastolic dysfunction). - Aortic valve: Mildly calcified annulus. Trileaflet; mildly   thickened leaflets. Valve area (VTI): 2.21 cm^2. Valve area   (Vmax): 2.35 cm^2. - Mitral valve: Mildly calcified annulus. Mildly thickened leaflets   . - Technically adequate study.   07/2015 LUE Arterial  US  IMPRESSION: Left upper extremity arterial noninvasive study demonstrates no evidence of significant arterial occlusive disease.   10/2015 Lexiscan  MPI There was no ST segment deviation noted during stress. The study is normal. There are no perfusion defects consistent with prior infarct or current ischemia. This is a low risk study. The left ventricular ejection fraction is normal (55-65%).   08/2019 echo IMPRESSIONS     1. Left ventricular ejection fraction, by estimation, is 60 to 65%. The  left ventricle has normal function. Left ventricular endocardial border  not optimally defined to evaluate regional wall motion. There is mild left  ventricular hypertrophy of the  posterior segment. Left ventricular diastolic parameters are consistent  with Grade I diastolic dysfunction (impaired relaxation).   2. Right ventricular systolic function is normal. The right ventricular  size is normal. There is normal pulmonary artery systolic pressure.   3. The mitral valve is degenerative. Trivial mitral valve regurgitation.   4. The aortic valve is tricuspid. Aortic valve regurgitation is not  visualized. No aortic stenosis is present.   Assessment and Plan   1. Left brachial artery occlusion   - secondary to thromboembolism, unclear source of the embolism (cardiac vs aortic atheroscerlosis)   -she has not been interested in NOACs - she will continue coumadin    2. Hyperlipidemia -side effects on statins and zetia  - turn down pcsk9 inhibitors -has tolerated crestor   - lipids at goal,continue curren tmeds   3.Syncope - unclear etiology, ongoing workup by neuro. Has been referred to neuro - reports some orthostatic symptoms, poor oral hydration. Orthostatics very abnormal SBP drops 37 points with standing and symptoms during testing. Discussed increased oral hydration.  - can have palpitations associated with episodes, plan for 2 week zio pathc.      Jacqueline Morgan, M.D.

## 2023-12-13 NOTE — Patient Instructions (Addendum)
 Medication Instructions:  Your physician recommends that you continue on your current medications as directed. Please refer to the Current Medication list given to you today.   Labwork: None  Testing/Procedures: Your physician has recommended that you wear a Zio monitor.   This monitor is a medical device that records the heart's electrical activity. Doctors most often use these monitors to diagnose arrhythmias. Arrhythmias are problems with the speed or rhythm of the heartbeat. The monitor is a small device applied to your chest. You can wear one while you do your normal daily activities. While wearing this monitor if you have any symptoms to push the button and record what you felt. Once you have worn this monitor for the period of time provider prescribed (for 14 days), you will return the monitor device in the postage paid box. Once it is returned they will download the data collected and provide us  with a report which the provider will then review and we will call you with those results. Important tips:  Avoid showering during the first 24 hours of wearing the monitor. Avoid excessive sweating to help maximize wear time. Do not submerge the device, no hot tubs, and no swimming pools. Keep any lotions or oils away from the patch. After 24 hours you may shower with the patch on. Take brief showers with your back facing the shower head.  Do not remove patch once it has been placed because that will interrupt data and decrease adhesive wear time. Push the button when you have any symptoms and write down what you were feeling. Once you have completed wearing your monitor, remove and place into box which has postage paid and place in your outgoing mailbox.  If for some reason you have misplaced your box then call our office and we can provide another box and/or mail it off for you.   Follow-Up: Your physician recommends that you schedule a follow-up appointment in: 6 weeks with Almarie  Any  Other Special Instructions Will Be Listed Below (If Applicable).  Needs to increase her hydration to 2L of water  or more daily, could drink low sugar/low calorie sports drink as alternative   Thank you for choosing [facility]!     If you need a refill on your cardiac medications before your next appointment, please call your pharmacy.

## 2023-12-13 NOTE — Progress Notes (Signed)
 INR 1.8. Please see anticoagulation encounter

## 2023-12-13 NOTE — Telephone Encounter (Signed)
 Checking percert on the following   LONG TERM MONITOR (3-14 DAYS)

## 2023-12-13 NOTE — Patient Instructions (Signed)
Take warfarin 1 tablet tonight then resume 1/2 tablet daily except 1 tablet on Mondays and Fridays.   Recheck INR 6 weeks

## 2023-12-15 DIAGNOSIS — M542 Cervicalgia: Secondary | ICD-10-CM | POA: Diagnosis not present

## 2023-12-21 DIAGNOSIS — M542 Cervicalgia: Secondary | ICD-10-CM | POA: Diagnosis not present

## 2023-12-26 DIAGNOSIS — M542 Cervicalgia: Secondary | ICD-10-CM | POA: Diagnosis not present

## 2023-12-28 ENCOUNTER — Other Ambulatory Visit: Payer: Self-pay | Admitting: Cardiology

## 2023-12-29 DIAGNOSIS — M542 Cervicalgia: Secondary | ICD-10-CM | POA: Diagnosis not present

## 2024-01-02 DIAGNOSIS — M542 Cervicalgia: Secondary | ICD-10-CM | POA: Diagnosis not present

## 2024-01-04 DIAGNOSIS — M542 Cervicalgia: Secondary | ICD-10-CM | POA: Diagnosis not present

## 2024-01-05 DIAGNOSIS — R55 Syncope and collapse: Secondary | ICD-10-CM | POA: Diagnosis not present

## 2024-01-09 DIAGNOSIS — M542 Cervicalgia: Secondary | ICD-10-CM | POA: Diagnosis not present

## 2024-01-22 ENCOUNTER — Other Ambulatory Visit: Payer: Self-pay

## 2024-01-22 DIAGNOSIS — I6523 Occlusion and stenosis of bilateral carotid arteries: Secondary | ICD-10-CM

## 2024-01-23 ENCOUNTER — Ambulatory Visit (INDEPENDENT_AMBULATORY_CARE_PROVIDER_SITE_OTHER): Admitting: Nurse Practitioner

## 2024-01-23 ENCOUNTER — Encounter: Payer: Self-pay | Admitting: Nurse Practitioner

## 2024-01-23 ENCOUNTER — Ambulatory Visit: Attending: Cardiology | Admitting: *Deleted

## 2024-01-23 VITALS — BP 116/80 | HR 71 | Ht 60.0 in | Wt 149.0 lb

## 2024-01-23 DIAGNOSIS — I6523 Occlusion and stenosis of bilateral carotid arteries: Secondary | ICD-10-CM

## 2024-01-23 DIAGNOSIS — Z87898 Personal history of other specified conditions: Secondary | ICD-10-CM

## 2024-01-23 DIAGNOSIS — R55 Syncope and collapse: Secondary | ICD-10-CM

## 2024-01-23 DIAGNOSIS — I70208 Unspecified atherosclerosis of native arteries of extremities, other extremity: Secondary | ICD-10-CM

## 2024-01-23 DIAGNOSIS — R42 Dizziness and giddiness: Secondary | ICD-10-CM | POA: Diagnosis not present

## 2024-01-23 DIAGNOSIS — I82621 Acute embolism and thrombosis of deep veins of right upper extremity: Secondary | ICD-10-CM

## 2024-01-23 DIAGNOSIS — Z5181 Encounter for therapeutic drug level monitoring: Secondary | ICD-10-CM | POA: Diagnosis not present

## 2024-01-23 DIAGNOSIS — E785 Hyperlipidemia, unspecified: Secondary | ICD-10-CM | POA: Diagnosis not present

## 2024-01-23 DIAGNOSIS — I10 Essential (primary) hypertension: Secondary | ICD-10-CM

## 2024-01-23 LAB — POCT INR: INR: 2.8 (ref 2.0–3.0)

## 2024-01-23 MED ORDER — METOPROLOL SUCCINATE ER 25 MG PO TB24: 12.5000 mg | ORAL_TABLET | Freq: Every day | ORAL | 1 refills | Status: AC

## 2024-01-23 NOTE — Progress Notes (Unsigned)
 Cardiology Office Note:  .   Date: 01/23/2024 ID:  Jacqueline Morgan, DOB 1940/08/03, MRN [id number] PCP: Alphonsa Glendia LABOR, MD  [facility] Providers Cardiologist:  Alvan Carrier, MD    History of Present Illness: .   Jacqueline Morgan is a 83 y.o. female with a PMH of syncope, palpitations, DOE, HLD, hx of left brachial artery occlusion secondary to thromboembolism, tremors, HTN, GERD, and fibromyalgia, who presents today for 6 week follow-up.   Last seen by Dr. Alvan on December 13, 2023.  She had reported several episodes of this and noted sensation of tachycardia, said this most heavily happened in the hot weather.  She admitted to some poor oral hydration and also noted some palpitations. Was closely being followed by Neuro. her orthostatics were very abnormal in clinic with SBP dropping 37 points with standing, was symptomatic.  Recommended to increase oral hydration.  2-week ZIO monitor was arranged -see preliminary monitor report noted below.  Today she is here for follow-up with her daughter. She says she is feeling better compared to last office visit, has not had any more recurrence of syncope.  Daughter states that she has to remind patient consistently to stay well-hydrated, has also been drinking Body Armor, continues to note some dizziness and has been doing physical therapy. Continues to note dizziness. Denies any chest pain, shortness of breath, palpitations, syncope, presyncope, orthopnea, PND, swelling or significant weight changes, acute bleeding, or claudication. Admits to poor motivaction when it comes to drinking fluids, does admit to poor sleep. Lost husband in 2014, night time is difficult for her per daughter's report. Denies any SI/HI.    Studies Reviewed: SABRA    EKG: EKG Interpretation Date/Time:  Tuesday January 23 2024 13:57:04 EDT Ventricular Rate:  71 PR Interval:  162 QRS Duration:  76 QT Interval:  408 QTC Calculation: 443 R Axis:   29  Text  Interpretation: Normal sinus rhythm Low voltage QRS Cannot rule out Anterior infarct (cited on or before 23-Jan-2024) When compared with ECG of 18-Apr-2023 15:11, QRS axis Shifted right Confirmed by Miriam Norris [phone]) on 01/23/2024 2:22:08 PM   EKG Interpretation Date/Time:  Tuesday January 23 2024 13:57:04 EDT Ventricular Rate:  71 PR Interval:  162 QRS Duration:  76 QT Interval:  408 QTC Calculation: 443 R Axis:   29  Text Interpretation: Normal sinus rhythm Low voltage QRS Cannot rule out Anterior infarct (cited on or before 23-Jan-2024) When compared with ECG of 18-Apr-2023 15:11, QRS axis Shifted right Confirmed by Miriam Norris [phone]) on 01/23/2024 2:22:08 PM  Cardiac monitor 01/2024:  Patch Wear Time:  13 days and 23 hours (2025-09-10T16:08:11-398 to 2025-09-24T16:08:00-0400)   Patient had a min HR of 57 bpm, max HR of 138 bpm, and avg HR of 72 bpm. Predominant underlying rhythm was Sinus Rhythm. 10 Supraventricular Tachycardia runs occurred, the run with the fastest interval lasting 5 beats with a max rate of 138 bpm, the  longest lasting 16 beats with an avg rate of 96 bpm. Isolated SVEs were rare (<1.0%), SVE Couplets were rare (<1.0%), and no SVE Triplets were present. Isolated VEs were rare (<1.0%), and no VE Couplets or VE Triplets were present. Ventricular Trigeminy  was present.  Carotid duplex 12/2022: Summary:  Right Carotid: Velocities in the right ICA are consistent with a 1-39%  stenosis.   Left Carotid: Velocities in the left ICA are consistent with a 1-39%  stenosis.   Vertebrals: Bilateral vertebral arteries demonstrate antegrade flow.  Subclavians:  Normal flow hemodynamics were seen in bilateral subclavian arteries.  Carotid duplex 12/2021:  Summary:  Right Carotid: Velocities in the right ICA are consistent with a 1-39%  stenosis.   Left Carotid: Velocities in the left ICA are consistent with a 1-39%  stenosis.   Vertebrals: Bilateral vertebral  arteries demonstrate antegrade flow.  Subclavians: Normal flow hemodynamics were seen in bilateral subclavian               arteries.   *See table(s) above for measurements and observations.    Vascular ultrasound LE venous reflex right 11/2021:  Summary:  Right:  - No evidence of deep vein thrombosis seen in the right lower extremity,  from the common femoral through the popliteal veins.  - No evidence of superficial venous thrombosis in the right lower  extremity.    - Venous reflux is noted in the right common femoral vein.  - Venous reflux is noted in the right greater saphenous vein in the calf.    - Right popliteal fossa complex cystic structure likely represents a  Baker's cyst.  *See table(s) above for measurements and observations.   Echo 08/2019:   1. Left ventricular ejection fraction, by estimation, is 60 to 65%. The  left ventricle has normal function. Left ventricular endocardial border  not optimally defined to evaluate regional wall motion. There is mild left  ventricular hypertrophy of the  posterior segment. Left ventricular diastolic parameters are consistent  with Grade I diastolic dysfunction (impaired relaxation).   2. Right ventricular systolic function is normal. The right ventricular  size is normal. There is normal pulmonary artery systolic pressure.   3. The mitral valve is degenerative. Trivial mitral valve regurgitation.   4. The aortic valve is tricuspid. Aortic valve regurgitation is not  visualized. No aortic stenosis is present.   Comparison(s): Echocardiogram done 07/16/15 showed an EF of 65%.  Myoview  10/2015:  There was no ST segment deviation noted during stress. The study is normal. There are no perfusion defects consistent with prior infarct or current ischemia. This is a low risk study. The left ventricular ejection fraction is normal (55-65%).  Physical Exam:   VS:  BP 116/80 (BP Location: Right Arm, Cuff Size: Large)   Pulse 71   Ht  5' (1.524 m)   Wt 149 lb (67.6 kg)   BMI 29.10 kg/m    Wt Readings from Last 3 Encounters:  01/23/24 149 lb (67.6 kg)  12/13/23 151 lb (68.5 kg)  09/06/23 147 lb 6.4 oz (66.9 kg)    GEN: Well nourished, well developed in no acute distress, tremors noted on exam NECK: No JVD; No carotid bruits CARDIAC: S1/S2, RRR, no murmurs, rubs, gallops RESPIRATORY:  Clear to auscultation without rales, wheezing or rhonchi  ABDOMEN: Soft, non-tender, non-distended EXTREMITIES:  No edema; No deformity   ASSESSMENT AND PLAN: .    1. Syncope, dizziness Etiology most likely related to poor hydration. Continued to encourage her to stay well-hydrated.  Will reduce metoprolol  succinate to 12.5 mg daily.  Will obtain echocardiogram for further evaluation. Heart healthy diet and regular cardiovascular exercise encouraged. Care and ED precautions discussed.   2. HTN BP stable. Discussed to monitor BP at home at least 2 hours after medications and sitting for 5-10 minutes. No medication changes at this time. Heart healthy diet and regular cardiovascular exercise encouraged.   2. HLD Last LDL 07/2023  was 66, managed by PCP. Continue rosuvastatin . Heart healthy diet and regular cardiovascular exercise encouraged.  3. 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 Embolic source was unclear. Continue to follow-up at [facility]inic. Last carotid dopplers revealed bilateral ICA stenosis at 1-39%. Continue current medication regimen. Continue to follow-up with [facility]. Scheduled for an upcoming carotid duplex in November 2025.   Dispo: Follow-up with Dr. Dorn Ross or APP in 2-3 months or sooner if anything changes.   Signed, Almarie Crate, NP

## 2024-01-23 NOTE — Progress Notes (Signed)
 INR-2.8 Please see anticoagulation encounter

## 2024-01-23 NOTE — Patient Instructions (Signed)
 Continue warfarin 1/2 tablet daily except 1 tablet on Mondays and Fridays.   ?Recheck INR 6 weeks  ?

## 2024-01-23 NOTE — Patient Instructions (Addendum)
 Medication Instructions:  Your physician has recommended you make the following change in your medication:  Please reduce Metoprolol  to 12.5 Mg daily   Labwork: None   Testing/Procedures: Your physician has requested that you have an echocardiogram. Echocardiography is a painless test that uses sound waves to create images of your heart. It provides your doctor with information about the size and shape of your heart and how well your heart's chambers and valves are working. This procedure takes approximately one hour. There are no restrictions for this procedure. Please do NOT wear cologne, perfume, aftershave, or lotions (deodorant is allowed). Please arrive 15 minutes prior to your appointment time.  Please note: We ask at that you not bring children with you during ultrasound (echo/ vascular) testing. Due to room size and safety concerns, children are not allowed in the ultrasound rooms during exams. Our front office staff cannot provide observation of children in our lobby area while testing is being conducted. An adult accompanying a patient to their appointment will only be allowed in the ultrasound room at the discretion of the ultrasound technician under special circumstances. We apologize for any inconvenience.  Follow-Up: Your physician recommends that you schedule a follow-up appointment in: 2-3 months   Any Other Special Instructions Will Be Listed Below (If Applicable).  If you need a refill on your cardiac medications before your next appointment, please call your pharmacy.

## 2024-01-24 ENCOUNTER — Encounter

## 2024-01-24 DIAGNOSIS — M542 Cervicalgia: Secondary | ICD-10-CM | POA: Diagnosis not present

## 2024-01-26 DIAGNOSIS — Z1231 Encounter for screening mammogram for malignant neoplasm of breast: Secondary | ICD-10-CM | POA: Diagnosis not present

## 2024-01-26 LAB — HM MAMMOGRAPHY

## 2024-02-01 ENCOUNTER — Ambulatory Visit

## 2024-02-09 ENCOUNTER — Encounter: Payer: Self-pay | Admitting: Family Medicine

## 2024-02-13 ENCOUNTER — Ambulatory Visit: Attending: Nurse Practitioner

## 2024-02-13 DIAGNOSIS — R42 Dizziness and giddiness: Secondary | ICD-10-CM

## 2024-02-13 DIAGNOSIS — Z87898 Personal history of other specified conditions: Secondary | ICD-10-CM

## 2024-02-14 LAB — ECHOCARDIOGRAM COMPLETE
AR max vel: 2.22 cm2
AV Area VTI: 2.07 cm2
AV Area mean vel: 2 cm2
AV Mean grad: 3.6 mmHg
AV Peak grad: 7.1 mmHg
Ao pk vel: 1.33 m/s
Area-P 1/2: 2.29 cm2
Calc EF: 74.8 %
S' Lateral: 2.8 cm
Single Plane A2C EF: 72.3 %
Single Plane A4C EF: 77.5 %

## 2024-02-22 ENCOUNTER — Ambulatory Visit: Payer: Self-pay | Admitting: Nurse Practitioner

## 2024-02-27 ENCOUNTER — Ambulatory Visit (INDEPENDENT_AMBULATORY_CARE_PROVIDER_SITE_OTHER)

## 2024-02-27 ENCOUNTER — Ambulatory Visit: Admitting: Physician Assistant

## 2024-02-27 VITALS — BP 155/84 | HR 91

## 2024-02-27 DIAGNOSIS — I6523 Occlusion and stenosis of bilateral carotid arteries: Secondary | ICD-10-CM

## 2024-02-27 NOTE — Progress Notes (Signed)
 Office Note     CC:  follow up Requesting Provider:  Alphonsa Glendia LABOR, MD  HPI: Jacqueline Morgan is a 83 y.o. (07-Dec-1940) female who presents for surveillance of carotid artery stenosis.  She denies any history of CVA or TIA.  She also denies any strokelike symptoms since last office visit including slurring speech, changes in vision, or one-sided weakness.  She takes a daily statin.  She is also on warfarin daily.  She has also been followed in the past for PAD.  She denies any claudication, rest pain, or tissue loss of bilateral lower extremities.   Past Medical History:  Diagnosis Date   Anxiety    Arthritis    Complication of anesthesia    passed out after dental surgery   Depression    Diabetes mellitus without complication (HCC)    Fibromyalgia    GERD (gastroesophageal reflux disease)    Hyperlipidemia    Hypertension    Palpitations    Tremor, essential 11/30/2015    Past Surgical History:  Procedure Laterality Date   ARTERY BIOPSY Right 08/30/2023   Procedure: BIOPSY TEMPORAL ARTERY;  Surgeon: Okey Burns, MD;  Location: [facility] OR;  Service: ENT;  Laterality: Right;   COLONOSCOPY N/A 04/01/2015   Procedure: COLONOSCOPY;  Surgeon: Claudis RAYMOND Rivet, MD;  Location: [facility];  Service: Endoscopy;  Laterality: N/A;  1:45   COLONOSCOPY WITH PROPOFOL  N/A 10/18/2019   Procedure: COLONOSCOPY WITH PROPOFOL ;  Surgeon: Rivet Claudis RAYMOND, MD;  Location: [facility];  Service: Endoscopy;  Laterality: N/A;  730   EMBOLECTOMY Left 12/19/2012   Procedure: EMBOLECTOMY OF LEFT BRACHIAL ARTERY;  Surgeon: Redell LITTIE Door, MD;  Location: [facility] OR;  Service: Vascular;  Laterality: Left;   EYE SURGERY Bilateral    cataract   POLYPECTOMY  10/18/2019   Procedure: POLYPECTOMY;  Surgeon: Rivet Claudis RAYMOND, MD;  Location: [facility];  Service: Endoscopy;;    Social History   Socioeconomic History   Marital status: Widowed    Spouse name: Not on file   Number of children: 2   Years  of education: 9   Highest education level: 9th grade  Occupational History   Occupation: Retired    Comment: income tax work  Tobacco Use   Smoking status: Never    Passive exposure: Never   Smokeless tobacco: Never  Vaping Use   Vaping status: Never Used  Substance and Sexual Activity   Alcohol use: No   Drug use: No   Sexual activity: Not on file  Other Topics Concern   Not on file  Social History Narrative   Lives alone   Right-handed   Caffeine: 2 twelve oz Dr. Shawn per day   Social Drivers of Health   Financial Resource Strain: Patient Declined (06/28/2023)   Overall Financial Resource Strain (CARDIA)    Difficulty of Paying Living Expenses: Patient declined  Food Insecurity: No Food Insecurity (06/28/2023)   Hunger Vital Sign    Worried About Running Out of Food in the Last Year: Never true    Ran Out of Food in the Last Year: Never true  Transportation Needs: No Transportation Needs (06/28/2023)   PRAPARE - Administrator, Civil Service (Medical): No    Lack of Transportation (Non-Medical): No  Physical Activity: Unknown (06/28/2023)   Exercise Vital Sign    Days of Exercise per Week: Patient declined    Minutes of Exercise per Session: 20 min  Stress: Patient Declined (06/28/2023)  [employer] of Occupational Health - Occupational Stress Questionnaire    Feeling of Stress : Patient declined  Social Connections: Moderately Integrated (06/28/2023)   Social Connection and Isolation Panel    Frequency of Communication with Friends and Family: More than three times a week    Frequency of Social Gatherings with Friends and Family: Three times a week    Attends Religious Services: More than 4 times per year    Active Member of Clubs or Organizations: Yes    Attends Banker Meetings: More than 4 times per year    Marital Status: Widowed  Catering Manager Violence: Not on file    Family History  Problem Relation Age of Onset   Cancer  Mother        ovarian, colon   Heart disease Mother    Heart attack Father    Glaucoma Father    COPD Sister    COPD Brother    Benign prostatic hyperplasia Brother     Current Outpatient Medications  Medication Sig Dispense Refill   acetaminophen  (TYLENOL ) 500 MG tablet Take 2 tablets (1,000 mg total) by mouth every 8 (eight) hours as needed for moderate pain (pain score 4-6). For headache/pain 30 tablet 0   albuterol  (VENTOLIN  HFA) 108 (90 Base) MCG/ACT inhaler Inhale 1-2 puffs into the lungs every 6 (six) hours as needed for wheezing or shortness of breath. 8 g 0   blood glucose meter kit and supplies Dispense based on patient and insurance preference. Use to check sugars once per day. (FOR ICD-10 E10.9, E11.9). 1 each 0   fluticasone  (FLONASE ) 50 MCG/ACT nasal spray Place 2 sprays into both nostrils daily. 16 g 2   glucose blood (ONETOUCH VERIO) test strip USE TO CHECK SUGAR ONCE DAILY DX CODE R73.03, E11.69, E78.5 50 strip 5   Lancets (ONETOUCH DELICA PLUS LANCET33G) MISC USE TO CHECK SUGAR ONCE DAILY DX CODE R73.03, E11.69, E78.5 100 each 5   latanoprost  (XALATAN ) 0.005 % ophthalmic solution Place 1 drop into both eyes at bedtime.     LORazepam  (ATIVAN ) 1 MG tablet TAKE (1/2) TABLET DURING THE DAY AND (1) TABLET AT BEDTIME AS NEEDED. 45 tablet 5   metFORMIN  (GLUCOPHAGE -XR) 500 MG 24 hr tablet Take 1 tablet (500 mg total) by mouth daily with breakfast. 90 tablet 3   metoprolol  succinate (TOPROL -XL) 25 MG 24 hr tablet Take 0.5 tablets (12.5 mg total) by mouth daily. TAKE 1 TABLET BY MOUTH ONCE  DAILY 45 tablet 1   ondansetron  (ZOFRAN ) 8 MG tablet Take 1 tablet (8 mg total) by mouth 3 (three) times daily as needed for nausea or vomiting. 15 tablet 2   pantoprazole  (PROTONIX ) 40 MG tablet Take 1 tablet (40 mg total) by mouth 2 (two) times daily. 180 tablet 3   PARoxetine  (PAXIL ) 20 MG tablet TAKE 1 TABLET BY MOUTH IN THE  MORNING AND 2 TABLETS BY MOUTH  IN THE EVENING 270 tablet 3    rosuvastatin  (CRESTOR ) 10 MG tablet Take 1 tablet (10 mg total) by mouth daily. 90 tablet 3   sodium chloride  (OCEAN) 0.65 % SOLN nasal spray Place 1 spray into both nostrils as needed. 30 mL 5   sucralfate  (CARAFATE ) 1 g tablet TAKE 1 TABLET BY MOUTH BEFORE MEALS AND AT BEDTIME. 120 tablet 5   warfarin (COUMADIN ) 5 MG tablet TAKE 1/2 -1 TABLET BY MOUTH DAILY AS DIRECTED. 30 tablet 5   amoxicillin -clavulanate (AUGMENTIN ) 875-125 MG tablet Take 1 tablet by mouth 2 (  two) times daily. (Patient not taking: Reported on 02/27/2024) 20 tablet 0   methylPREDNISolone  (MEDROL  DOSEPAK) 4 MG TBPK tablet Take with signs of chronic sinusitis and take as directed (Patient not taking: Reported on 02/27/2024) 1 each 1   No current facility-administered medications for this visit.    Allergies  Allergen Reactions   Codeine     REACTION: Adverse gastrointestinal effects   Statins     Felt bad     REVIEW OF SYSTEMS:  Negative unless noted in HPI [X]  denotes positive finding, [ ]  denotes negative finding Cardiac  Comments:  Chest pain or chest pressure:    Shortness of breath upon exertion:    Short of breath when lying flat:    Irregular heart rhythm:        Vascular    Pain in calf, thigh, or hip brought on by ambulation:    Pain in feet at night that wakes you up from your sleep:     Blood clot in your veins:    Leg swelling:         Pulmonary    Oxygen at home:    Productive cough:     Wheezing:         Neurologic    Sudden weakness in arms or legs:     Sudden numbness in arms or legs:     Sudden onset of difficulty speaking or slurred speech:    Temporary loss of vision in one eye:     Problems with dizziness:         Gastrointestinal    Blood in stool:     Vomited blood:         Genitourinary    Burning when urinating:     Blood in urine:        Psychiatric    Major depression:         Hematologic    Bleeding problems:    Problems with blood clotting too easily:         Skin    Rashes or ulcers:        Constitutional    Fever or chills:      PHYSICAL EXAMINATION:  Vitals:   02/27/24 1329  BP: (!) 155/84  Pulse: 91    General:  WDWN in NAD; vital signs documented above Gait: Not observed HENT: WNL, normocephalic Pulmonary: normal non-labored breathing Cardiac: regular HR Abdomen: soft, NT, no masses Skin: without rashes Vascular Exam/Pulses: Palpable right PT; palpable left PT and DP Extremities: without ischemic changes, without Gangrene , without cellulitis; without open wounds;  Musculoskeletal: no muscle wasting or atrophy  Neurologic: A&O X 3; cranial nerves grossly intact Psychiatric:  The pt has Normal affect.   Non-Invasive Vascular Imaging:   Carotid duplex demonstrates 1 to 39% stenosis of bilateral internal carotid arteries    ASSESSMENT/PLAN:: 83 y.o. female here for follow up for surveillance of carotid artery stenosis  Subjectively, Mrs. Wisecup has been doing well since last office visit.  She denies any neurological events including slurring speech, change in vision, or one-sided weakness.  Carotid duplex is stable demonstrating 1 to 39% stenosis of bilateral internal carotid arteries.  We will repeat carotid duplex in 1 year.  She will continue her statin daily.  On exam she has palpable pedal pulses thus we did not reorder ABI/TBI.  She will notify the office with any questions or concerns.   Donnice Sender, PA-C [facility] [phone]

## 2024-03-04 DIAGNOSIS — R55 Syncope and collapse: Secondary | ICD-10-CM

## 2024-03-05 ENCOUNTER — Ambulatory Visit: Attending: Cardiology | Admitting: *Deleted

## 2024-03-05 DIAGNOSIS — I82621 Acute embolism and thrombosis of deep veins of right upper extremity: Secondary | ICD-10-CM

## 2024-03-05 DIAGNOSIS — Z5181 Encounter for therapeutic drug level monitoring: Secondary | ICD-10-CM

## 2024-03-05 LAB — POCT INR: INR: 2.4 (ref 2.0–3.0)

## 2024-03-05 NOTE — Progress Notes (Signed)
 INR 2.4 Please see anticoagulation encounter

## 2024-03-05 NOTE — Patient Instructions (Signed)
 Continue warfarin 1/2 tablet daily except 1 tablet on Mondays and Fridays.   ?Recheck INR 6 weeks  ?

## 2024-03-13 ENCOUNTER — Ambulatory Visit (INDEPENDENT_AMBULATORY_CARE_PROVIDER_SITE_OTHER): Admitting: Family Medicine

## 2024-03-13 ENCOUNTER — Encounter: Payer: Self-pay | Admitting: Family Medicine

## 2024-03-13 VITALS — BP 125/55 | HR 87 | Temp 97.2°F | Ht 60.0 in | Wt 151.0 lb

## 2024-03-13 DIAGNOSIS — N1832 Chronic kidney disease, stage 3b: Secondary | ICD-10-CM

## 2024-03-13 DIAGNOSIS — R110 Nausea: Secondary | ICD-10-CM | POA: Diagnosis not present

## 2024-03-13 DIAGNOSIS — G250 Essential tremor: Secondary | ICD-10-CM | POA: Diagnosis not present

## 2024-03-13 DIAGNOSIS — E1169 Type 2 diabetes mellitus with other specified complication: Secondary | ICD-10-CM

## 2024-03-13 DIAGNOSIS — E785 Hyperlipidemia, unspecified: Secondary | ICD-10-CM | POA: Diagnosis not present

## 2024-03-13 DIAGNOSIS — Z79899 Other long term (current) drug therapy: Secondary | ICD-10-CM

## 2024-03-13 DIAGNOSIS — E1122 Type 2 diabetes mellitus with diabetic chronic kidney disease: Secondary | ICD-10-CM | POA: Diagnosis not present

## 2024-03-13 MED ORDER — ONDANSETRON HCL 8 MG PO TABS: 8.0000 mg | ORAL_TABLET | Freq: Three times a day (TID) | ORAL | 2 refills | Status: AC | PRN

## 2024-03-13 MED ORDER — LORAZEPAM 1 MG PO TABS: ORAL_TABLET | ORAL | 5 refills | Status: AC

## 2024-03-13 NOTE — Progress Notes (Signed)
° °  Subjective:    Patient ID: Jacqueline Morgan, female    DOB: [birth year], 83 y.o.   MRN: [id number]  HPI 6 month follow up chronic medical concerns and diabetes Dizziness ongoing intermittent dizzy feeling Intermittent off-balance Contributed by her tremor Uses a cane on a regular basis  She has tremor issues She has seen a specialist before in the past They have tried medicines She did not tolerate She is desired to pretty much she stay as is for now She does get intermittent nausea issues that will last for a few minutes and then get better happens several days a week  Review of Systems     Objective:   Physical Exam  General-in no acute distress Eyes-no discharge Lungs-respiratory rate normal, CTA CV-no murmurs,RRR Extremities skin warm dry no edema Neuro grossly normal Behavior normal, alert   Handicap placard form filled out patient qualifies due to ataxia tremor and weakness   Bno  Assessment & Plan:  1. Hyperlipidemia associated with type 2 diabetes mellitus (HCC) (Primary) Lab work ordered Continue med Directv - Lipid Panel  2. Type 2 diabetes mellitus with stage 3b chronic kidney disease, without long-term current use of insulin  (HCC) Check A1c continue med healthy diet - Hemoglobin A1c - Basic metabolic panel with GFR - Microalbumin/Creatinine Ratio, Urine  3. Nausea Zofran  when necessary - ondansetron  (ZOFRAN ) 8 MG tablet; Take 1 tablet (8 mg total) by mouth 3 (three) times daily as needed for nausea or vomiting.  Dispense: 15 tablet; Refill: 2  4. Tremor, essential Has already been seen by neurology patient tolerating her condition as best she can  5. High risk medication use Labs ordered - Hepatic function panel  Patient also uses lorazepam  for chronic anxiety and insomnia denies abusing it  Patient to follow-up in 6 months

## 2024-03-20 ENCOUNTER — Ambulatory Visit: Payer: Self-pay | Admitting: Cardiology

## 2024-03-23 NOTE — Telephone Encounter (Signed)
 Nurses Patient will need to do some lab work-please do CRP, CBC, sed rate because of temporal headache  She will also need to do the office visit either with myself or one of the additional practitioners NP/PA's  Please help arrange

## 2024-03-25 ENCOUNTER — Other Ambulatory Visit: Payer: Self-pay

## 2024-03-25 DIAGNOSIS — R519 Headache, unspecified: Secondary | ICD-10-CM

## 2024-03-27 ENCOUNTER — Ambulatory Visit: Payer: Self-pay | Admitting: Family Medicine

## 2024-03-27 LAB — SEDIMENTATION RATE: Sed Rate: 35 mm/h (ref 0–40)

## 2024-03-27 LAB — CBC WITH DIFFERENTIAL/PLATELET
Basophils Absolute: 0.1 x10E3/uL (ref 0.0–0.2)
Basos: 1 %
EOS (ABSOLUTE): 0.4 x10E3/uL (ref 0.0–0.4)
Eos: 4 %
Hematocrit: 36.5 % (ref 34.0–46.6)
Hemoglobin: 11.2 g/dL (ref 11.1–15.9)
Immature Grans (Abs): 0 x10E3/uL (ref 0.0–0.1)
Immature Granulocytes: 0 %
Lymphocytes Absolute: 3 x10E3/uL (ref 0.7–3.1)
Lymphs: 36 %
MCH: 26.9 pg (ref 26.6–33.0)
MCHC: 30.7 g/dL — ABNORMAL LOW (ref 31.5–35.7)
MCV: 88 fL (ref 79–97)
Monocytes Absolute: 0.6 x10E3/uL (ref 0.1–0.9)
Monocytes: 8 %
Neutrophils Absolute: 4.3 x10E3/uL (ref 1.4–7.0)
Neutrophils: 51 %
Platelets: 428 x10E3/uL (ref 150–450)
RBC: 4.17 x10E6/uL (ref 3.77–5.28)
RDW: 14.9 % (ref 11.7–15.4)
WBC: 8.4 x10E3/uL (ref 3.4–10.8)

## 2024-03-27 LAB — C-REACTIVE PROTEIN: CRP: 3 mg/L (ref 0–10)

## 2024-03-27 LAB — LIPID PANEL

## 2024-03-28 LAB — BASIC METABOLIC PANEL WITH GFR
BUN/Creatinine Ratio: 16 (ref 12–28)
BUN: 19 mg/dL (ref 8–27)
CO2: 22 mmol/L (ref 20–29)
Calcium: 9.6 mg/dL (ref 8.7–10.3)
Chloride: 101 mmol/L (ref 96–106)
Creatinine, Ser: 1.22 mg/dL — AB (ref 0.57–1.00)
Glucose: 123 mg/dL — AB (ref 70–99)
Potassium: 4.7 mmol/L (ref 3.5–5.2)
Sodium: 140 mmol/L (ref 134–144)
eGFR: 44 mL/min/1.73 — AB

## 2024-03-28 LAB — HEPATIC FUNCTION PANEL
ALT: 15 IU/L (ref 0–32)
AST: 20 IU/L (ref 0–40)
Albumin: 4 g/dL (ref 3.7–4.7)
Alkaline Phosphatase: 107 IU/L (ref 48–129)
Bilirubin Total: 0.2 mg/dL (ref 0.0–1.2)
Bilirubin, Direct: 0.11 mg/dL (ref 0.00–0.40)
Total Protein: 6.9 g/dL (ref 6.0–8.5)

## 2024-03-28 LAB — SPECIMEN STATUS REPORT

## 2024-03-28 LAB — MICROALBUMIN / CREATININE URINE RATIO

## 2024-03-28 LAB — LIPID PANEL
Cholesterol, Total: 174 mg/dL (ref 100–199)
HDL: 49 mg/dL
LDL CALC COMMENT:: 3.6 ratio (ref 0.0–4.4)
LDL Chol Calc (NIH): 86 mg/dL (ref 0–99)
Triglycerides: 231 mg/dL — AB (ref 0–149)
VLDL Cholesterol Cal: 39 mg/dL (ref 5–40)

## 2024-03-28 LAB — HEMOGLOBIN A1C
Est. average glucose Bld gHb Est-mCnc: 137 mg/dL
Hgb A1c MFr Bld: 6.4 % — ABNORMAL HIGH (ref 4.8–5.6)

## 2024-03-29 ENCOUNTER — Telehealth: Admitting: Family Medicine

## 2024-03-29 ENCOUNTER — Ambulatory Visit: Payer: Self-pay

## 2024-03-29 DIAGNOSIS — R3989 Other symptoms and signs involving the genitourinary system: Secondary | ICD-10-CM | POA: Diagnosis not present

## 2024-03-29 MED ORDER — CEPHALEXIN 500 MG PO CAPS: 500.0000 mg | ORAL_CAPSULE | Freq: Two times a day (BID) | ORAL | 0 refills | Status: AC

## 2024-03-29 NOTE — Progress Notes (Signed)

## 2024-03-29 NOTE — Telephone Encounter (Signed)
 FYI Only or Action Required?: Action required by provider: Refusing ED, needs call back, attempted to alert [facility] to ED refusal but no answer.  Patient was last seen in primary care on 03/13/2024 by Alphonsa Glendia LABOR, MD.  Called Nurse Triage reporting Urinary Retention.  Symptoms began several days ago.  Interventions attempted: Rest, hydration, or home remedies.  Symptoms are: rapidly worsening.  Triage Disposition: Go to ED Now (Notify PCP)  Patient/caregiver understands and will follow disposition?: No, refuses disposition     Copied from CRM [id number]. Topic: Clinical - Red Word Triage >> Mar 29, 2024  1:52 PM Kevelyn M wrote: Red Word that prompted transfer to Nurse Triage: Patient is unable to urinate and she is experience burning as well since Wednesday. Tried to produce a urine sample on Wednesday for lab but was unable to. Reason for Disposition  [1] Unable to urinate (or only a few drops) > 4 hours AND [2] bladder feels very full (e.g., palpable bladder or strong urge to urinate)  Answer Assessment - Initial Assessment Questions This RN recommended pt be examined in ED for symptoms, pt refusing. Advised pt call 911/ED right away if too weak to stand, feels feverish, or heart racing. Pt verbalized understanding. Attempted to alert [facility] to ED refusal but no answer.    Day that went for bloodwork was 12/23, couldn't urinate then, had to cancel that test, but past 2-3 days, been feeling like having to go a lot but not going a lot, but when do it's that burning sensation First thing when get up it's a lot of urine After that it's not but just droplets Lot of urine this morning when woke up, confirms just droplets since Odor Poor historian Rest of the day is little drops Cold chills when urinate only Hmmmm no not too weak to stand Know that I stay dehydrated most of the time but with the burning sensation and feel that need to go, have had that before, with UTI No heart racing,  fever, dizziness No don't think I want to do that, definitely don't want to go to the ER  Protocols used: Urinary Symptoms-A-AH

## 2024-03-30 ENCOUNTER — Ambulatory Visit: Payer: Self-pay | Admitting: Family Medicine

## 2024-03-30 ENCOUNTER — Other Ambulatory Visit: Payer: Self-pay | Admitting: Family Medicine

## 2024-04-01 NOTE — Telephone Encounter (Signed)
 Patient did e visit on 03/29/24

## 2024-04-01 NOTE — Telephone Encounter (Signed)
 Noted

## 2024-04-05 ENCOUNTER — Ambulatory Visit (INDEPENDENT_AMBULATORY_CARE_PROVIDER_SITE_OTHER): Admitting: Nurse Practitioner

## 2024-04-05 VITALS — BP 100/64 | HR 84 | Temp 97.8°F | Ht 60.0 in | Wt 150.0 lb

## 2024-04-05 DIAGNOSIS — F411 Generalized anxiety disorder: Secondary | ICD-10-CM | POA: Diagnosis not present

## 2024-04-05 DIAGNOSIS — E785 Hyperlipidemia, unspecified: Secondary | ICD-10-CM

## 2024-04-05 DIAGNOSIS — E1169 Type 2 diabetes mellitus with other specified complication: Secondary | ICD-10-CM | POA: Diagnosis not present

## 2024-04-05 DIAGNOSIS — I10 Essential (primary) hypertension: Secondary | ICD-10-CM

## 2024-04-05 DIAGNOSIS — G4486 Cervicogenic headache: Secondary | ICD-10-CM

## 2024-04-05 DIAGNOSIS — K219 Gastro-esophageal reflux disease without esophagitis: Secondary | ICD-10-CM

## 2024-04-05 DIAGNOSIS — N1832 Chronic kidney disease, stage 3b: Secondary | ICD-10-CM | POA: Insufficient documentation

## 2024-04-05 DIAGNOSIS — F341 Dysthymic disorder: Secondary | ICD-10-CM

## 2024-04-05 DIAGNOSIS — Z7984 Long term (current) use of oral hypoglycemic drugs: Secondary | ICD-10-CM | POA: Diagnosis not present

## 2024-04-05 DIAGNOSIS — N1831 Chronic kidney disease, stage 3a: Secondary | ICD-10-CM | POA: Insufficient documentation

## 2024-04-05 DIAGNOSIS — E1122 Type 2 diabetes mellitus with diabetic chronic kidney disease: Secondary | ICD-10-CM | POA: Diagnosis not present

## 2024-04-05 DIAGNOSIS — R7303 Prediabetes: Secondary | ICD-10-CM

## 2024-04-05 NOTE — Progress Notes (Signed)
 "  Subjective:    Patient ID: Jacqueline Morgan, female    DOB: 12/02/1940, 84 y.o.   MRN: [id number]  HPI Discussed the use of AI scribe software for clinical note transcription with the patient, who gave verbal consent to proceed.  History of Present Illness Jacqueline Morgan is an 84 year old female with chronic kidney disease who presents for evaluation of kidney function and hydration status. Daughter is present today.   She has been experiencing issues with hydration, often opting for Dr. Nunzio instead of water  due to water  making her feel nauseous. She did not consume any fluids for over 13 hours before her last blood work, which may have affected the results.  Her blood sugar levels are generally well-controlled with metformin , with readings typically between 83 to 98 mg/dL, although it was 861 mg/dL on the day of the visit. Her A1c has been stable at 6.4%. She monitors her blood sugar every morning.  She experiences throbbing headaches similar to migraines, located in her temples and associated with recurrent neck tightness/pain. These headaches are exacerbated by weather changes. She has a sheet with physical therapy exercises at home. Has had a work up for temporal arteritis which was negative. PT has helped with headaches in the past.   She has a history of acid reflux, which is generally controlled with pantoprazole . She occasionally uses Carafate  for flare-ups.  She reports constipation, previously managed with nightly stool softeners. Hydration impacts her bowel movements.  No chest pain, shortness of breath, or wheezing, although she sometimes experiences nighttime coughing.   Review of Systems  Constitutional:  Positive for fatigue.  HENT:  Positive for congestion, postnasal drip and sinus pressure. Negative for ear pain and sore throat.   Respiratory:  Positive for cough. Negative for chest tightness, shortness of breath and wheezing.   Cardiovascular:  Negative for  chest pain and leg swelling.  Gastrointestinal:  Positive for constipation. Negative for diarrhea.  Neurological:  Positive for headaches. Negative for speech difficulty.      04/05/2024    1:28 PM  Depression screen PHQ 2/9  Decreased Interest 0  Down, Depressed, Hopeless 0  PHQ - 2 Score 0  Altered sleeping 2  Tired, decreased energy 3  Change in appetite 0  Feeling bad or failure about yourself  0  Trouble concentrating 0  Moving slowly or fidgety/restless 0  Suicidal thoughts 0  PHQ-9 Score 5  Difficult doing work/chores Not difficult at all      04/05/2024    1:29 PM 06/28/2023    3:42 PM 11/02/2022    3:05 PM 09/12/2022    1:39 PM  GAD 7 : Generalized Anxiety Score  Nervous, Anxious, on Edge 2 0 1 3  Control/stop worrying 3 0  3  Worry too much - different things 3 0  3  Trouble relaxing  0  3  Restless 0 0  3  Easily annoyed or irritable 0 0  0  Afraid - awful might happen 3 0  0  Total GAD 7 Score  0  15  Anxiety Difficulty Not difficult at all Not difficult at all  Not difficult at all    Social History[1]     Objective:   Physical Exam NAD.  Alert, oriented.  TMs mild clear effusion, no erythema.  Pharynx clear and moist.  Neck supple with mild soft anterior cervical adenopathy.  Extremely tight tender muscles noted all along the lateral neck area into the trapezius and upper  rhomboids bilaterally.  Lungs clear.  Heart regular rate rhythm.  Abdomen soft nondistended with minimal epigastric area tenderness, otherwise benign.  Lower extremities no edema.  Today's Vitals   04/05/24 1319  BP: 100/64  Pulse: 84  Temp: 97.8 F (36.6 C)  SpO2: 99%  Weight: 150 lb (68 kg)  Height: 5' (1.524 m)   Body mass index is 29.29 kg/m.  Reviewed labs from 03/26/2024 with patient and her daughter and concerns discussed.  Patient was unable to get a urine for ACR which will be reordered.        Assessment & Plan:  1. Essential hypertension Managed with metoprolol ,  well-controlled. Discussed ACE inhibitors and ARBs risks. - Continue metoprolol . Follow up with cardiology as planned.  - Microalbumin / creatinine urine ratio - Basic metabolic panel with GFR  2. Gastroesophageal reflux disease without esophagitis Managed with pantoprazole . Occasional flare-ups controlled with Carafate . Discussed long-term pantoprazole  risks, benefits outweigh risks. - Continue pantoprazole . - Use Carafate  as needed.  3. Cervicogenic headache Headaches cervicogenic, originating from neck tension. Previous temporal arteritis biopsy negative. Discussed non-pharmacological interventions. - Recommended heat application and neck exercises. - Suggested massage therapy. - Consider headache tracking for possible migraines.  4. Hyperlipidemia associated with type 2 diabetes mellitus (HCC) Cholesterol well-controlled with Crestor . Triglycerides slightly increased, HDL favorable. Discussed monitoring triglycerides. - Continue Crestor .  5. Type 2 diabetes mellitus with stage 3b chronic kidney disease, without long-term current use of insulin  (HCC) (Primary) Proteinuria assessment needed. Discussed SGLT2 inhibitors for kidney preservation if proteinuria significant and EGFR low. Emphasized hydration. - Ordered urine protein test. - Repeat kidney function test in 2-3 weeks. - Encouraged hydration. - Consider SGLT2 inhibitor based on results and insurance. - Continue metformin . - Monitor blood sugar regularly  6. GAD (generalized anxiety disorder) Managed with paroxetine  and lorazepam . Current regimen effective. - Continue paroxetine  and lorazepam .  7. Persistent depressive disorder Managed with paroxetine  and lorazepam . Current regimen effective. - Continue paroxetine  and lorazepam .   Return in 6 months (on 10/03/2024), or if symptoms worsen or fail to improve.        [1]  Social History Tobacco Use   Smoking status: Never    Passive exposure: Never   Smokeless  tobacco: Never  Vaping Use   Vaping status: Never Used  Substance Use Topics   Alcohol use: No   Drug use: No   "

## 2024-04-06 ENCOUNTER — Encounter: Payer: Self-pay | Admitting: Nurse Practitioner

## 2024-04-06 DIAGNOSIS — G4486 Cervicogenic headache: Secondary | ICD-10-CM | POA: Insufficient documentation

## 2024-04-06 DIAGNOSIS — G44209 Tension-type headache, unspecified, not intractable: Secondary | ICD-10-CM | POA: Insufficient documentation

## 2024-04-06 DIAGNOSIS — M542 Cervicalgia: Secondary | ICD-10-CM | POA: Insufficient documentation

## 2024-04-07 ENCOUNTER — Encounter: Payer: Self-pay | Admitting: Neurology

## 2024-04-16 ENCOUNTER — Ambulatory Visit: Attending: Cardiology | Admitting: *Deleted

## 2024-04-16 DIAGNOSIS — I82621 Acute embolism and thrombosis of deep veins of right upper extremity: Secondary | ICD-10-CM | POA: Diagnosis not present

## 2024-04-16 DIAGNOSIS — Z5181 Encounter for therapeutic drug level monitoring: Secondary | ICD-10-CM | POA: Diagnosis not present

## 2024-04-16 LAB — POCT INR: INR: 2.2 (ref 2.0–3.0)

## 2024-04-16 NOTE — Patient Instructions (Signed)
 Continue warfarin 1/2 tablet daily except 1 tablet on Mondays and Fridays.   ?Recheck INR 6 weeks  ?

## 2024-04-16 NOTE — Progress Notes (Signed)
 INR 2.2; Please see anticoagulation encounter

## 2024-04-25 ENCOUNTER — Ambulatory Visit: Payer: Self-pay | Admitting: Nurse Practitioner

## 2024-04-25 LAB — BASIC METABOLIC PANEL WITH GFR
BUN/Creatinine Ratio: 15 (ref 12–28)
BUN: 16 mg/dL (ref 8–27)
CO2: 22 mmol/L (ref 20–29)
Calcium: 9.2 mg/dL (ref 8.7–10.3)
Chloride: 100 mmol/L (ref 96–106)
Creatinine, Ser: 1.09 mg/dL — ABNORMAL HIGH (ref 0.57–1.00)
Glucose: 110 mg/dL — ABNORMAL HIGH (ref 70–99)
Potassium: 4.9 mmol/L (ref 3.5–5.2)
Sodium: 137 mmol/L (ref 134–144)
eGFR: 50 mL/min/1.73 — ABNORMAL LOW

## 2024-04-25 LAB — MICROALBUMIN / CREATININE URINE RATIO
Creatinine, Urine: 115.1 mg/dL
Microalb/Creat Ratio: 3 mg/g{creat} (ref 0–29)
Microalbumin, Urine: 3.6 ug/mL

## 2024-05-02 ENCOUNTER — Ambulatory Visit (INDEPENDENT_AMBULATORY_CARE_PROVIDER_SITE_OTHER): Admitting: Otolaryngology

## 2024-05-02 ENCOUNTER — Encounter (INDEPENDENT_AMBULATORY_CARE_PROVIDER_SITE_OTHER): Payer: Self-pay | Admitting: Otolaryngology

## 2024-05-02 VITALS — BP 129/73 | HR 83 | Ht 60.0 in | Wt 152.0 lb

## 2024-05-02 DIAGNOSIS — R0981 Nasal congestion: Secondary | ICD-10-CM | POA: Diagnosis not present

## 2024-05-02 DIAGNOSIS — J343 Hypertrophy of nasal turbinates: Secondary | ICD-10-CM | POA: Diagnosis not present

## 2024-05-02 DIAGNOSIS — H6123 Impacted cerumen, bilateral: Secondary | ICD-10-CM

## 2024-05-02 DIAGNOSIS — J329 Chronic sinusitis, unspecified: Secondary | ICD-10-CM

## 2024-05-02 DIAGNOSIS — J0101 Acute recurrent maxillary sinusitis: Secondary | ICD-10-CM | POA: Insufficient documentation

## 2024-05-02 MED ORDER — PREDNISONE 10 MG (21) PO TBPK: ORAL_TABLET | ORAL | 0 refills | Status: AC

## 2024-05-02 MED ORDER — AMOXICILLIN-POT CLAVULANATE 875-125 MG PO TABS: 1.0000 | ORAL_TABLET | Freq: Two times a day (BID) | ORAL | 0 refills | Status: AC

## 2024-05-02 NOTE — Addendum Note (Signed)
 Addended byBETHA KARIS CLUNES on: 05/02/2024 04:57 PM   Modules accepted: Level of Service

## 2024-05-02 NOTE — Progress Notes (Signed)
 CC: Recurrent sinusitis  History of Present Illness Jacqueline Morgan is an 84 year old female who presents today complaining of recurrent sinusitis.  The patient was previously seen by Dr. Soldatova.  Dr. Soldatova has since left the practice.  She reports intermittent facial and nasal soreness characterized by swelling and pain involving the nose, face, forehead, gums, mouth, and ears. Symptoms fluctuate and often worsen with changes in weather. She currently experiences congestion and facial pain, with swelling noted behind the ear. She denies current epistaxis but previously discontinued intranasal steroids due to nosebleeds. She also describes occasional dizziness and discomfort behind the eyes, though her ophthalmologist has not identified any ocular pathology.  She has used saline nasal spray for symptom relief and previously used intranasal steroids, which were discontinued due to epistaxis. She is not currently using allergy medications. She has not recently taken antibiotics for sinus symptoms; her last antibiotic course was three to four weeks ago for a urinary tract infection.   Her MRI scan in August 2024 did not show any significant sinus pathology.  She has not had surgery in the ear, nose, or throat region, though she did have a temporal artery biopsy in the past.  She reports occasional hearing difficulty, with the right ear more severely affected.     Past Medical History:  Diagnosis Date   Anxiety    Arthritis    Complication of anesthesia    passed out after dental surgery   Depression    Diabetes mellitus without complication (HCC)    Fibromyalgia    GERD (gastroesophageal reflux disease)    Hyperlipidemia    Hypertension    Palpitations    Tremor, essential 11/30/2015    Past Surgical History:  Procedure Laterality Date   ARTERY BIOPSY Right 08/30/2023   Procedure: BIOPSY TEMPORAL ARTERY;  Surgeon: Okey Burns, MD;  Location: [facility] OR;  Service: ENT;   Laterality: Right;   COLONOSCOPY N/A 04/01/2015   Procedure: COLONOSCOPY;  Surgeon: Claudis RAYMOND Rivet, MD;  Location: [facility];  Service: Endoscopy;  Laterality: N/A;  1:45   COLONOSCOPY WITH PROPOFOL  N/A 10/18/2019   Procedure: COLONOSCOPY WITH PROPOFOL ;  Surgeon: Rivet Claudis RAYMOND, MD;  Location: [facility];  Service: Endoscopy;  Laterality: N/A;  730   EMBOLECTOMY Left 12/19/2012   Procedure: EMBOLECTOMY OF LEFT BRACHIAL ARTERY;  Surgeon: Redell LITTIE Door, MD;  Location: [facility] OR;  Service: Vascular;  Laterality: Left;   EYE SURGERY Bilateral    cataract   POLYPECTOMY  10/18/2019   Procedure: POLYPECTOMY;  Surgeon: Rivet Claudis RAYMOND, MD;  Location: [facility];  Service: Endoscopy;;    Family History  Problem Relation Age of Onset   Cancer Mother        ovarian, colon   Heart disease Mother    Heart attack Father    Glaucoma Father    COPD Sister    COPD Brother    Benign prostatic hyperplasia Brother     Social History:  reports that she has never smoked. She has never been exposed to tobacco smoke. She has never used smokeless tobacco. She reports that she does not drink alcohol and does not use drugs.  Allergies: Allergies[1]  Prior to Admission medications  Medication Sig Start Date End Date Taking? Authorizing Provider  acetaminophen  (TYLENOL ) 500 MG tablet Take 2 tablets (1,000 mg total) by mouth every 8 (eight) hours as needed for moderate pain (pain score 4-6). For headache/pain 08/30/23  Yes Soldatova, Liuba, MD  albuterol  (VENTOLIN  HFA) 108 (90 Base) MCG/ACT inhaler Inhale 1-2 puffs into the lungs every 6 (six) hours as needed for wheezing or shortness of breath. 04/03/23  Yes Myrna Camelia HERO, NP  amoxicillin -clavulanate (AUGMENTIN ) 875-125 MG tablet Take 1 tablet by mouth 2 (two) times daily for 7 days. 05/02/24 05/09/24 Yes Karis Clunes, MD  blood glucose meter kit and supplies Dispense based on patient and insurance preference. Use to check sugars once per day. (FOR ICD-10  E10.9, E11.9). 02/05/21  Yes Alphonsa Glendia LABOR, MD  fluticasone  (FLONASE ) 50 MCG/ACT nasal spray Place 2 sprays into both nostrils daily. 12/21/21  Yes Alphonsa Glendia LABOR, MD  glucose blood (ONETOUCH VERIO) test strip USE TO CHECK SUGAR ONCE DAILY DX CODE R73.03, E11.69, E78.5 05/18/21  Yes Luking, Glendia LABOR, MD  Lancets (ONETOUCH DELICA PLUS LANCET33G) MISC USE TO CHECK SUGAR ONCE DAILY DX CODE R73.03, E11.69, E78.5 05/18/21  Yes Luking, Scott A, MD  latanoprost  (XALATAN ) 0.005 % ophthalmic solution Place 1 drop into both eyes at bedtime. 05/18/23  Yes [provider]  LORazepam  (ATIVAN ) 1 MG tablet TAKE (1/2) TABLET DURING THE DAY AND (1) TABLET AT BEDTIME AS NEEDED. 03/13/24  Yes Luking, Glendia LABOR, MD  metFORMIN  (GLUCOPHAGE -XR) 500 MG 24 hr tablet Take 1 tablet (500 mg total) by mouth daily with breakfast. 09/13/23  Yes Luking, Glendia LABOR, MD  metoprolol  succinate (TOPROL -XL) 25 MG 24 hr tablet Take 0.5 tablets (12.5 mg total) by mouth daily. TAKE 1 TABLET BY MOUTH ONCE  DAILY 01/23/24  Yes Miriam Norris, NP  ondansetron  (ZOFRAN ) 8 MG tablet Take 1 tablet (8 mg total) by mouth 3 (three) times daily as needed for nausea or vomiting. 03/13/24  Yes Luking, Glendia LABOR, MD  pantoprazole  (PROTONIX ) 40 MG tablet Take 1 tablet (40 mg total) by mouth 2 (two) times daily. 09/13/23  Yes Luking, Glendia LABOR, MD  PARoxetine  (PAXIL ) 20 MG tablet TAKE 1 TABLET BY MOUTH IN THE  MORNING AND 2 TABLETS BY MOUTH  IN THE EVENING 09/13/23  Yes Luking, Scott A, MD  predniSONE  (STERAPRED UNI-PAK 21 TAB) 10 MG (21) TBPK tablet Per instructions (6,5,4,3,2,1) 05/02/24  Yes Karis Clunes, MD  rosuvastatin  (CRESTOR ) 10 MG tablet Take 1 tablet (10 mg total) by mouth daily. 09/13/23  Yes Luking, Glendia LABOR, MD  sodium chloride  (OCEAN) 0.65 % SOLN nasal spray Place 1 spray into both nostrils as needed. 07/20/23  Yes Soldatova, Liuba, MD  sucralfate  (CARAFATE ) 1 g tablet TAKE 1 TABLET BY MOUTH BEFORE MEALS AND AT BEDTIME. 11/30/23  Yes Luking, Scott A, MD   warfarin (COUMADIN ) 5 MG tablet TAKE 1/2 -1 TABLET BY MOUTH DAILY AS DIRECTED. 12/28/23  Yes Branch, Dorn FALCON, MD    Blood pressure 129/73, pulse 83, height 5' (1.524 m), weight 152 lb (68.9 kg), SpO2 90%. Exam: General: Communicates without difficulty, well nourished, no acute distress. Head: Normocephalic, no evidence injury, no tenderness, facial buttresses intact without stepoff. Face/sinus: No tenderness to palpation and percussion. Facial movement is normal and symmetric. Eyes: PERRL, EOMI. No scleral icterus, conjunctivae clear. Neuro: CN II exam reveals vision grossly intact.  No nystagmus at any point of gaze. Ears: Auricles well formed without lesions.  Bilateral cerumen impaction.  Nose: External evaluation reveals normal support and skin without lesions.  Dorsum is intact.  Anterior rhinoscopy reveals edematous and erythematous mucosa over anterior aspect of inferior turbinates and intact septum. Oral:  Oral cavity and oropharynx are intact, symmetric, without erythema or edema.  Mucosa is  moist without lesions. Neck: Full range of motion without pain.  There is no significant lymphadenopathy.  No masses palpable.  Thyroid  bed within normal limits to palpation.  Parotid glands and submandibular glands equal bilaterally without mass.  Trachea is midline. Neuro:  CN 2-12 grossly intact.   Procedure: Bilateral cerumen disimpaction Anesthesia: None Description: Under the operating microscope, the cerumen is carefully removed with a combination of cerumen currette, alligator forceps, and suction catheters.  After the cerumen is removed, the TMs are noted to be normal.  No mass, erythema, or lesions. The patient tolerated the procedure well.    Assessment & Plan Recurrent acute sinusitis She has chronic and recurrent facial pain, nasal congestion, and swelling, with inflamed nasal mucosa on examination but without significant polyposis.  Bilateral inferior turbinate hypertrophy.  Symptoms  fluctuate with weather changes. Oral steroids were considered to reduce inflammation, with attention to her prediabetes and risk of hyperglycemia. Antibiotics were selected for possible bacterial etiology. Conditional imaging was discussed if symptoms persist. - Prescribed Augmentin  for 7 days. - Prescribed a 6-day tapering course of oral steroids.  The possible side effects are extensively discussed. - Provided instructions for home blood glucose monitoring during steroid use. - Advised follow-up in 3 weeks to reassess symptoms and response to treatment. - Discussed conditional plan for CT scan of the sinuses if symptoms do not improve.  Impacted cerumen, bilateral She had significant bilateral cerumen impaction, more severe on the right side, resulting in hearing impairment. No evidence of infection or other otologic pathology after removal. - Otomicroscopy with bilateral cerumen disimpaction.   Mildreth Reek W Summerlynn Glauser 05/02/2024, 1:37 PM      [1]  Allergies Allergen Reactions   Codeine     REACTION: Adverse gastrointestinal effects   Statins     Felt bad

## 2024-05-06 ENCOUNTER — Ambulatory Visit: Admitting: Nurse Practitioner

## 2024-05-23 ENCOUNTER — Ambulatory Visit (INDEPENDENT_AMBULATORY_CARE_PROVIDER_SITE_OTHER): Admitting: Otolaryngology

## 2024-05-28 ENCOUNTER — Ambulatory Visit

## 2024-06-14 ENCOUNTER — Ambulatory Visit: Admitting: Nurse Practitioner

## 2024-09-11 ENCOUNTER — Ambulatory Visit: Admitting: Family Medicine
